# Patient Record
Sex: Male | Born: 1970 | Race: White | Hispanic: No | Marital: Single
Health system: Midwestern US, Community
[De-identification: ages and names within clinical notes are randomized; demographics above are authoritative.]

## PROBLEM LIST (undated history)

## (undated) DIAGNOSIS — E782 Mixed hyperlipidemia: Secondary | ICD-10-CM

## (undated) DIAGNOSIS — I10 Essential (primary) hypertension: Secondary | ICD-10-CM

## (undated) DIAGNOSIS — I2510 Atherosclerotic heart disease of native coronary artery without angina pectoris: Secondary | ICD-10-CM

## (undated) DIAGNOSIS — E785 Hyperlipidemia, unspecified: Secondary | ICD-10-CM

## (undated) DIAGNOSIS — 1 ERRONEOUS ENCOUNTER ICD10: Secondary | ICD-10-CM

---

## 2014-03-13 ENCOUNTER — Inpatient Hospital Stay
Admit: 2014-03-13 | Discharge: 2014-03-18 | Disposition: A | Discharge status: Home / Self Care | Payer: BLUE CROSS/BLUE SHIELD | Source: Ambulatory Visit | Attending: Cardiovascular Disease | Admitting: Cardiovascular Disease

## 2014-03-13 DIAGNOSIS — I2109 ST elevation (STEMI) myocardial infarction involving other coronary artery of anterior wall: Principal | ICD-10-CM

## 2014-03-13 LAB — TROPONIN
Troponin: 11.1 ng/mL (ref 0.00–0.03)
Troponin: 13.01 ng/mL (ref 0.00–0.03)

## 2014-03-13 MED ORDER — BIVALIRUDIN 250 MG IV SOLR
250 MG | INTRAVENOUS | Status: AC
Start: 2014-03-13 — End: 2014-03-14
  Administered 2014-03-14 (×4): 1.75 mg/kg/h via INTRAVENOUS

## 2014-03-13 MED ORDER — MORPHINE SULFATE (PF) 4 MG/ML IV SOLN
4 MG/ML | INTRAVENOUS | Status: DC | PRN
Start: 2014-03-13 — End: 2014-03-17
  Administered 2014-03-14 – 2014-03-17 (×6): 2 mg via INTRAVENOUS

## 2014-03-13 MED ORDER — CLOPIDOGREL BISULFATE 75 MG PO TABS
75 MG | Freq: Every day | ORAL | Status: DC
Start: 2014-03-13 — End: 2014-03-13

## 2014-03-13 MED ORDER — NITROGLYCERIN IN D5W 200-5 MCG/ML-% IV SOLN
200-5 MCG/ML-% | INTRAVENOUS | Status: DC | PRN
Start: 2014-03-13 — End: 2014-03-15

## 2014-03-13 MED ORDER — SODIUM CHLORIDE 0.9 % IV SOLN
0.9 % | INTRAVENOUS | Status: DC
Start: 2014-03-13 — End: 2014-03-13
  Administered 2014-03-13 (×2): 1.75 mg/kg/h via INTRAVENOUS
  Administered 2014-03-13: 13:00:00 0.25 mg/kg/h via INTRAVENOUS

## 2014-03-13 MED ORDER — CLOPIDOGREL BISULFATE 300 MG PO TABS
300 MG | Freq: Once | ORAL | Status: AC
Start: 2014-03-13 — End: 2014-03-13
  Administered 2014-03-13: 11:00:00 300 mg via ORAL

## 2014-03-13 MED ORDER — AMIODARONE HCL 150 MG/3ML IV SOLN
150 MG/3ML | Freq: Once | INTRAVENOUS | Status: AC
Start: 2014-03-13 — End: 2014-03-13
  Administered 2014-03-13: 20:00:00 300 mg via INTRAVENOUS

## 2014-03-13 MED ORDER — METOPROLOL TARTRATE 25 MG PO TABS
25 MG | Freq: Two times a day (BID) | ORAL | Status: DC
Start: 2014-03-13 — End: 2014-03-17
  Administered 2014-03-13 – 2014-03-18 (×10): 25 mg via ORAL

## 2014-03-13 MED ORDER — NORMAL SALINE FLUSH 0.9 % IV SOLN
0.9 % | Freq: Two times a day (BID) | INTRAVENOUS | Status: DC
Start: 2014-03-13 — End: 2014-03-15
  Administered 2014-03-14 – 2014-03-15 (×2): 10 mL via INTRAVENOUS

## 2014-03-13 MED ORDER — SODIUM CHLORIDE 0.9 % IV SOLN
0.9 % | INTRAVENOUS | Status: DC
Start: 2014-03-13 — End: 2014-03-14
  Administered 2014-03-13 – 2014-03-14 (×3): via INTRAVENOUS

## 2014-03-13 MED ORDER — METOPROLOL TARTRATE 25 MG PO TABS
25 MG | Freq: Once | ORAL | Status: AC
Start: 2014-03-13 — End: 2014-03-13
  Administered 2014-03-13: 17:00:00 25 mg via ORAL

## 2014-03-13 MED ORDER — MORPHINE SULFATE (PF) 4 MG/ML IV SOLN
4 MG/ML | INTRAVENOUS | Status: AC
Start: 2014-03-13 — End: 2014-03-13
  Administered 2014-03-13: 17:00:00 2 via INTRAVENOUS

## 2014-03-13 MED ORDER — AMIODARONE HCL 150 MG/3ML IV SOLN
150 MG/3ML | INTRAVENOUS | Status: DC
Start: 2014-03-13 — End: 2014-03-14
  Administered 2014-03-13: 20:00:00 1 mg/min via INTRAVENOUS
  Administered 2014-03-14 (×2): 0.5 mg/min via INTRAVENOUS

## 2014-03-13 MED ORDER — ALPRAZOLAM 0.25 MG PO TABS
0.25 MG | Freq: Four times a day (QID) | ORAL | Status: DC | PRN
Start: 2014-03-13 — End: 2014-03-17
  Administered 2014-03-13 – 2014-03-17 (×11): 0.25 mg via ORAL

## 2014-03-13 MED ORDER — ONDANSETRON HCL 4 MG/2ML IJ SOLN
4 MG/2ML | Freq: Four times a day (QID) | INTRAMUSCULAR | Status: DC | PRN
Start: 2014-03-13 — End: 2014-03-17
  Administered 2014-03-13: 14:00:00 4 mg via INTRAVENOUS

## 2014-03-13 MED ORDER — ACETAMINOPHEN 325 MG PO TABS
325 | ORAL | Status: DC | PRN
Start: 2014-03-13 — End: 2014-03-17
  Administered 2014-03-14 – 2014-03-16 (×4): 650 mg via ORAL

## 2014-03-13 MED ORDER — LISINOPRIL 2.5 MG PO TABS
2.5 MG | Freq: Once | ORAL | Status: AC
Start: 2014-03-13 — End: 2014-03-13
  Administered 2014-03-13: 17:00:00 2.5 mg via ORAL

## 2014-03-13 MED ORDER — LISINOPRIL 2.5 MG PO TABS
2.5 MG | Freq: Two times a day (BID) | ORAL | Status: DC
Start: 2014-03-13 — End: 2014-03-17
  Administered 2014-03-13 – 2014-03-18 (×10): 2.5 mg via ORAL

## 2014-03-13 MED ORDER — MAGNESIUM HYDROXIDE 400 MG/5ML PO SUSP
400 MG/5ML | Freq: Every day | ORAL | Status: DC | PRN
Start: 2014-03-13 — End: 2014-03-17

## 2014-03-13 MED ORDER — CLOPIDOGREL BISULFATE 75 MG PO TABS
75 MG | Freq: Every day | ORAL | Status: DC
Start: 2014-03-13 — End: 2014-03-17
  Administered 2014-03-13 – 2014-03-17 (×5): 75 mg via ORAL

## 2014-03-13 MED ORDER — ASPIRIN 325 MG PO TABS
325 MG | Freq: Every day | ORAL | Status: DC
Start: 2014-03-13 — End: 2014-03-17
  Administered 2014-03-14 – 2014-03-17 (×4): 325 mg via ORAL

## 2014-03-13 MED ORDER — NITROGLYCERIN IN D5W 200-5 MCG/ML-% IV SOLN
200-5 MCG/ML-% | INTRAVENOUS | Status: AC
Start: 2014-03-13 — End: 2014-03-13
  Administered 2014-03-13: 18:00:00 5 via INTRAVENOUS

## 2014-03-13 MED ORDER — NORMAL SALINE FLUSH 0.9 % IV SOLN
0.9 % | INTRAVENOUS | Status: DC | PRN
Start: 2014-03-13 — End: 2014-03-15

## 2014-03-13 MED ORDER — AMIODARONE HCL 150 MG/3ML IV SOLN
150 MG/3ML | Freq: Once | INTRAVENOUS | Status: DC
Start: 2014-03-13 — End: 2014-03-13

## 2014-03-13 MED FILL — ALPRAZOLAM 0.25 MG PO TABS: 0.25 MG | ORAL | Qty: 1

## 2014-03-13 MED FILL — LISINOPRIL 2.5 MG PO TABS: 2.5 MG | ORAL | Qty: 1

## 2014-03-13 MED FILL — ONDANSETRON HCL 4 MG/2ML IJ SOLN: 4 MG/2ML | INTRAMUSCULAR | Qty: 2

## 2014-03-13 MED FILL — BIVALIRUDIN 250 MG IV SOLR: 250 MG | INTRAVENOUS | Qty: 250

## 2014-03-13 MED FILL — AMIODARONE HCL 150 MG/3ML IV SOLN: 150 MG/3ML | INTRAVENOUS | Qty: 6

## 2014-03-13 MED FILL — AMIODARONE HCL 150 MG/3ML IV SOLN: 150 MG/3ML | INTRAVENOUS | Qty: 9

## 2014-03-13 MED FILL — METOPROLOL TARTRATE 25 MG PO TABS: 25 MG | ORAL | Qty: 1

## 2014-03-13 MED FILL — MORPHINE SULFATE (PF) 4 MG/ML IV SOLN: 4 mg/mL | INTRAVENOUS | Qty: 1

## 2014-03-13 MED FILL — NITROGLYCERIN IN D5W 200-5 MCG/ML-% IV SOLN: 200-5 MCG/ML-% | INTRAVENOUS | Qty: 250

## 2014-03-13 MED FILL — CLOPIDOGREL BISULFATE 75 MG PO TABS: 75 MG | ORAL | Qty: 1

## 2014-03-13 NOTE — Care Coordination-Inpatient (Signed)
PT resides at home alone and has family nearby who can assist if needed. PT plans to dc home with no known needs. Electronically signed by Crecencio Mc, MSW on 03/13/2014 at 9:23 AM

## 2014-03-13 NOTE — Progress Notes (Signed)
Patient continues to rest peacefully in the supine position. Augmentation alarm alarms occasionally. Vital signs stable. Rhythm is sinus brady; ectopy rare. Amio remains at 0.36m, nitro at 527m, angiomax at 1.7539mand ns at 100m31mill continue to monitor.  Electronically signed by MaraElna Breslow on 03/13/2014 at 10:50 PM

## 2014-03-13 NOTE — Progress Notes (Signed)
Reduced Amio gtt to 0.77m/hour. Will continue to monitor.   Electronically signed by MElna Breslow RN on 03/13/2014 at 9:31 PM

## 2014-03-13 NOTE — Progress Notes (Signed)
Spoke with Ginger Reddick RN with cardiology. Informed re: current bp and fact that pt vomited shortly after am meds given but could not say positive pills were not in emesis, although I did not see any obvious. She spoke with Dr Andres Labrum and repeat am med doses ordered.

## 2014-03-13 NOTE — Progress Notes (Signed)
amidarone bolus started. Will follow with Amiodarone gtt

## 2014-03-13 NOTE — Progress Notes (Signed)
Repeat am med doses now given as ordered.

## 2014-03-13 NOTE — Progress Notes (Signed)
Spoke with Monico Blitz RN with cardiologist Dr Andres Labrum. Clarified Plavix dose for today. Also clarified future lab troponin orders. Informed of last EKG results. Pt has had no further chest pain since early AM. Hr sinus with infrequent PVC after Amiodarone gtt started. Pt still c/o no sleep today, but has had many visitors and family is staying. Have requested to keep visitors to minimum and have been fewer visitors this afternoon except immediate family. Mother at bedside staying tonight. Pt's girlfriend in room at present.hr sinus 64. bp 114/69. IABP cont at 1:1. Angiomax gtt, Amiodarone gtt, Tridil gtt and NS continue as ordered.

## 2014-03-13 NOTE — Progress Notes (Signed)
Pt became suddenly nauseated after taking morning pills. Pt vomited 360cc mostly clear fluid. No pills noted. Pt was given Zofran 70m iv

## 2014-03-13 NOTE — Procedures (Signed)
CARDIOLOGY DEPARTMENT REPORT    CARDIOLOGIST:  Zoe Lan, MD    PROCEDURE DATE:  03/13/2014      PROCEDURES PERFORMED    Cardiac catheterization with angioplasty and stenting of the left anterior  descending coronary artery and placement of an intraaortic balloon pump.      INDICATION   Acute anterolateral wall myocardial infarction.      DESCRIPTION OF PROCEDURE    The right femoral region was sterilely prepped and draped.  Following  anesthesia with 2% Xylocaine, a 6-French short sheath was placed in the right  femoral artery and 5-French catheters were advanced without difficulty.      HEMODYNAMICS    1.  The AO is 120/80.    2.  The LV is 120/10 with an LVEDP of 30.      LEFT VENTRICULOGRAM    Left ventricle was viewed in the RAO position.  Optiray was injected 15 mL a  second for 2 seconds.  These views revealed apical dyskinesis with the  overall ejection fraction estimated to be 30% to 35%.      SELECTIVE CORONARY ARTERIOGRAPHY   LEFT MAIN CORONARY ARTERY:  The left main coronary artery is a large-caliber  vessel, intermediate in length, free of significant occlusive disease.    LEFT ANTERIOR DESCENDING:  The left anterior descending is a large-caliber  vessel, totally occluded in its proximal portion.    LEFT CIRCUMFLEX:  The left circumflex is a large-caliber vessel with a 70% to  80% stenosis in an early arising small obtuse marginal branch.  The left  circumflex is a large-caliber dominant vessel with a large region of  distribution.    RIGHT CORONARY ARTERY:  The right coronary artery is an intermediate caliber,  nondominant vessel with subtotal focused stenosis of the acute marginal  branch.      Following the above-mentioned coronary angiograms, the left main coronary  artery was engaged with a 6-French Q guide.  A Cougar _____ wire was placed  in the distal left anterior descending.  The point of total occlusion was  ballooned with a 3.5 x 12 mm TREK balloon with restoration of flow.   Subsequent  to this, a 4.0 x 23 mm Xience drug-eluting stent was placed and  dilated to 12 atmospheres.  This resulted in a very satisfactory angiographic  result.  There is noted to be persistent thrombus in the distal portion of  the left anterior descending diagonal.      Following the angioplasty and stenting, an 8-French sheath was placed in the  right femoral artery with a 8-French balloon placed in the descending aorta.   The balloon pump was secured to the skin with provided locking devices.      IMPRESSION    1.  Left ventriculogram revealing apical dyskinesis with the overall ejection  fraction estimated to be 30% to 35%.    2.  Total occlusion of the left anterior descending with revascularization  employing a 4.0 x 23 mm Xience drug-eluting stent.    3.  Severe disease of an early arising obtuse marginal branch and acute  marginal branch of the nondominant right coronary artery.          ________________________________  Zoe Lan, MD    [phone]  DD: 03/13/2014 04:53  DT: 03/13/2014 05:30  SSI File#: [id number]  Job #: [id number]    cc:

## 2014-03-13 NOTE — Progress Notes (Signed)
Received from cath lab via bed post cardiac cath, with IABP to R groin . angio max infusing and to remain continuous. Family at bedside.

## 2014-03-13 NOTE — Progress Notes (Signed)
[facility]  Progress Note                            Date:  03/13/2014  Patient: Gregory Escobar  Admission:  03/13/2014  5:07 AM  Admit DX: Acute MI ([place]) [I21.3]  Age:  44 y.o., 01/05/1971     LOS: 0 days     Reason for evaluation:                                                                Acute anterior- lateral wall myocardial infarction complicated by ventricular                                                                                               tachycardia/ventricular fibrillation.      SUBJECTIVE:    The patient was seen and examined. Notes and labs reviewed.  There were not complications over night.    Patient's cardiac review of systems: negative, for chest pain or shortness of breath.  The patient is resting more comfortably at present.  He had some nausea and vomiting earlier.  Also has been anxious but is calm at present.  Right groin soft with baloon pump in place.  Positive dp.  Breath sounds clear.        OBJECTIVE:    Telemetry: sinus     IMPRESSION   1. Left ventriculogram revealing apical dyskinesis with the overall ejection  fraction estimated to be 30% to 35%.   2. Total occlusion of the left anterior descending with revascularization  employing a 4.0 x 23 mm Xience drug-eluting stent.   3. Severe disease of an early arising obtuse marginal branch and acute  marginal branch of the nondominant right coronary artery.          BP 143/72 mmHg   Pulse 65   Temp(Src) 98.6 ??F (37 ??C) (Tympanic)   Resp 17   Ht 5' 7"  (1.702 m)   Wt 157 lb 3 oz (71.3 kg)   BMI 24.61 kg/m2   SpO2 97%    Intake/Output Summary (Last 24 hours) at 03/13/14 0955  Last data filed at 03/13/14 0900   Gross per 24 hour   Intake 376.05 ml   Output     78 ml   Net 298.05 ml       Labs:   CBC: No results for input(s): WBC, HGB, HCT, PLT in the last 72 hours.  BMP: No results for input(s): NA, K, CO2, BUN, CREATININE, LABGLOM, GLUCOSE in the last 72 hours.  BNP: No results for input(s): BNP in the  last 72 hours.  PT/INR: No results for input(s): PROTIME, INR in the last 72 hours.  APTT:No results for input(s): APTT in the last 72 hours.  CARDIAC ENZYMES:  Recent Labs      03/13/14  0704   TROPONINI  11.10*     FASTING LIPID PANEL:No results found for: HDL, LDLDIRECT, LDLCALC, TRIG  LIVER PROFILE:No results for input(s): AST, ALT, LABALBU in the last 72 hours.    NURSE:  Monico Blitz, RN    Reason for initial evaluation    Cad/mi      Today's Current Status: not resting well     These symptoms and signs show no change       PFSH:  New:  no       Change:  no    ROSS: New:  no        Change:  no      Physical Examination:  BP 151/89 mmHg   Pulse 69   Temp(Src) 98.6 ??F (37 ??C) (Tympanic)   Resp 16   Ht 5' 7"  (1.702 m)   Wt 157 lb 3 oz (71.3 kg)   BMI 24.61 kg/m2   SpO2 98%    CONSTITUTIONAL:  Awake, alert, cooperative, no apparent distress, and appears stated age.  HEENT: Normal jugular venous pulsations, no carotid bruits. Head is atraumatic, normocephalic. Eyes and oral mucosa are normal.  LUNGS: Respiratory effort for the work of breathing is normal Yes.  On auscultation: clear to auscultation bilaterally and diminished breath sounds bilaterally  CARDIOVASCULAR:  Normal apical impulse, regular rate and rhythm, normal S1 and S2, no S3 or S4, and no murmur or rub is noted.  ABDOMEN: Soft, non tender, non distended. Bowel sounds are present. No masses or tenderness.  SKIN: Warm and dry.  EXTREMITIES: No lower extremity edema.  No cyanosis or clubbing.  NEUROLOGY:  Motor movement grossly intact.  Focal signs are not identified.        Current Inpatient Medications:  ??? sodium chloride flush  10 mL Intravenous 2 times per day   ??? lisinopril  2.5 mg Oral BID   ??? aspirin  325 mg Oral Daily   ??? [START ON 03/14/2014] clopidogrel  75 mg Oral Daily   ??? metoprolol  25 mg Oral BID       IV Infusions (if any):  ??? sodium chloride 100 mL/hr at 03/13/14 0729   ??? bivulirudin (ANGIOMAX) infusion 0.25 mg/kg/hr (03/13/14 0828)    ??? nitroGLYCERIN 5 mcg/min (03/13/14 1230)       Diagnostics:           ASSESSMENT:    Patient Active Problem List    Diagnosis Date Noted   ??? Coronary artery disease involving native coronary artery 03/13/2014       PLAN:    1. Continue present medications  2. Continue to monitor rhythm  3.   Further orders per clinical course.   4. D/c iabp in am  5. amio for v.t.      Please see orders.  Discussed with patient and family and nursing.    Fredirick Maudlin, MD    [facility]                    D/c iabp

## 2014-03-13 NOTE — Plan of Care (Signed)
Problem: Anxiety:  Intervention: A calm environment  Lights dimmed, glass door pulled, and current pulled to view IABP    Goal: Level of anxiety will decrease  Level of anxiety will decrease   Outcome: Ongoing  Patient requested a 0.25mg  Xanax prior to sleep. Currently resting peacefully and shows no s/s of distress.     Problem: Tobacco Use:  Goal: Inpatient tobacco use cessation counseling participation  Inpatient tobacco use cessation counseling participation   Outcome: Ongoing  Educated patient on  Harmful effects of tobacco use; especially cardiovascular effects.    Problem: KNOWLEDGE DEFICIT  Intervention: EDUCATE PATIENT ABOUT MEDICATIONS  Verbally educated patient on new medications and provided Lex-comp handouts for patient to keep.

## 2014-03-13 NOTE — Progress Notes (Signed)
Pt c/o anxiety. Is very anxious. Any alarm or beeping in room has pt hr elevating. Family  In room also anxious awaiting Dr Andres Labrum to round.  Xanax 0.14m po given for anxiety. Will monitor for relief.

## 2014-03-13 NOTE — Progress Notes (Signed)
Initial assessment completed and charted. Patient is currently awake, alert, and oriented. IABP remains in place to right groin; site is c/d/i and shows no s/s of bleeding. Good waveform on IABP noted, no alarms currently going off. All pulses palpable, patient is NSR/SB, amio remains at 1, nitro at 5, angiomax at 1.67m, and ns [facility]  Zeroed IABP to phlebostatic axis and strip printed.   VSS. Will continue to monitor.   Electronically signed by MaElna BreslowRN on 03/13/2014 at 8:36 PM

## 2014-03-13 NOTE — Progress Notes (Signed)
Dr Andres Labrum here. Aware of abnormal rhythm with frequent PVC'S. Will order Amiodarone gtt. Spoke with pt and family re: continued IABP until am.

## 2014-03-13 NOTE — Progress Notes (Signed)
Pt bp 157/96. Hr 72. No c/o pain, but apears ST elevation segment some increased again per monitor. Have informed family limiting visitors today would be good idea. Pt needs rest most of all. Family agrees when taking phone calls will request wait until tomorrow to visit pt. Pt agrees with rest.

## 2014-03-13 NOTE — Progress Notes (Signed)
Monico Blitz RN with cardiology here. Informed her of n/v after am meds. Pt has had no c/o pain. Closes eyes and rests in between visitors.

## 2014-03-13 NOTE — Progress Notes (Signed)
Pt c/o chest pain and mild left arm pain. States " feels like when it first started this morning. Not bad yet , but maybe its from where they shocked me. They said i would be sore.". Morphine 105m iv given for pain rating of 5 on 0-10 scale. Will start Tridil at low dose iv gtt. See emar.

## 2014-03-13 NOTE — Progress Notes (Signed)
Patient was instructed to take asa at discharge.  It is not listed due to issues with the new EMR.

## 2014-03-13 NOTE — Progress Notes (Signed)
Patient resting at this time. Shows no s/s of distress. Breathing is even and unlabored. Vital signs stable. Call light within reach. Will continue to monitor.   Electronically signed by Elna Breslow, RN on 03/13/2014 at 9:13 PM

## 2014-03-13 NOTE — Progress Notes (Signed)
Patient c/o back and shoulder stiffness rating 7/10. 70m IV Morphine given. Denies any chest pain.  Will continue to monitor.  Electronically signed by MElna Breslow RN on 03/13/2014 at 8:37 PM

## 2014-03-13 NOTE — H&P (Signed)
HISTORY AND PHYSICAL    ADMIT DATE:  03/13/2014    REASON FOR ADMISSION  1.  Acute myocardial infarction, complicated by ventricular  tachycardia/ventricular fibrillation with successful direct current  cardioversion.  2.  Tobacco abuse.  3.  Family history of atherosclerotic cardiovascular disease.  4.  History of renal calculi extraction.    HISTORY OF PRESENT ILLNESS    The patient is a 44 year old white male who presents in transfer via  ambulance from [place].  The patient was awoken with chest pain in his chest  radiating through to his back and down his left arm.  The discomforts were  associated with diaphoresis and shortness of breath.  He describes no nausea  or vomiting.  He has had no exertional discomforts typical of angina.  He  denies orthopnea, paroxysmal nocturnal dyspnea or peripheral edema.  There  has been no fevers, chills or evidence of GI bleeding.  Risk factors for  coronary artery disease include tobacco abuse in family history.    REVIEW OF SYSTEMS   CONSTITUTIONAL:  No fevers, chills, night sweats, or weight loss.   HEENT:  No vision loss, double vision, blurred vision, or tearing.  No  hearing loss, tinnitus, or infection.  No nasal discharge or epistaxis.  No  dysphagia.   RESPIRATORY:  No shortness of breath, cough, or sputum production.  No  history of TB exposure.   CARDIAC:  There has been no exertional chest pain typical of angina, overt  heart failure, no syncope.    PERIPHERAL VASCULAR:  No history of claudication.   GASTROINTESTINAL:  No nausea, vomiting, diarrhea, or constipation.  No reflux  or gastroesophageal reflux disease.   GENITOURINARY:  No dysuria, urgency, frequency, or history of urinary tract  infections.  No history of nephrolithiasis or renal insufficiency.   NEUROLOGIC:  No history of cerebrovascular accident, transient ischemic  attack, or amaurosis fugax.  No history of seizure disorder.   INTEGUMENTARY:  No history of nonhealing wounds or skin cancer removal.    PSYCHIATRIC:  No excessive anxiety or depression.   ENDOCRINE:  No polyuria, polydipsia, or significant weight gain.  No heat or  cold intolerance.   MUSCULOSKELETAL:  No limit to range of motion of joints or swelling of limbs.    HEMATOLOGIC:  No history of DVT, PE, or anemia.     All other review of systems is negative.     PHYSICAL EXAMINATION  GENERAL:  Alert and oriented x3 in no apparent distress.  Short-term and  long-term memory intact. Judgment intact.   VITAL SIGNS:   HEAD:  Normocephalic without evidence of old or recent trauma.   EYES:  Sclerae clear.  Conjunctivae pink.  EOMs intact.  Pupils equal and  round.   EARS:  Negative.  Tympanic membranes not visualized.   NOSE:  Negative.   THROAT:  No lesions on lips or buccal mucosa.  Tongue protrudes in midline  and is well papillated.   NECK:  Supple without mass or JVD.  Carotid pulses 2+ to palpation  bilaterally without bruit.  No thyromegaly noted.   CHEST:  Equal bilateral expansion.   LUNGS:  Clear to auscultation and percussion.   HEART: Regular rate and rhythm without S3 or S4 murmur.   ABDOMEN:  Soft, nontender.  Bowel sounds x4 quadrants.  No hepatosplenomegaly  or palpable mass.   UPPER EXTREMITY EVALUATION:  Radial pulses palpable bilaterally.  No  cyanosis, clubbing, or edema.   LOWER EXTREMITY EVALUATION:  Femoral, popliteal, dorsalis pedis, and  posterior tibialis pulses 2+ to palpation bilaterally.  No cyanosis,  clubbing, or edema or signs of atheroembolic event.   SKIN:  Warm, dry, intact.   NEUROLOGIC:  Physiologic.   MUSCULOSKELETAL:  Negative.   RECTAL/GENITALIA:  Deferred.     IMPRESSION    Acute lateral wall myocardial infarction complicated by ventricular  tachycardia/ventricular fibrillation.    PLAN  Emergent cardiac catheterization.        ________________________________  Zoe Lan, MD    [id number]  DD: 03/13/2014 04:10  DT: 03/13/2014 05:10  SSI File#: [id number]  Job #: [id number]    cc:      Cardiology

## 2014-03-14 LAB — LDL CHOLESTEROL, DIRECT: LDL Direct: 77 mg/dL — ABNORMAL LOW (ref <100)

## 2014-03-14 LAB — COMPREHENSIVE METABOLIC PANEL
ALT: 73 U/L — ABNORMAL HIGH (ref 5–41)
AST: 233 U/L — ABNORMAL HIGH (ref 5–40)
Albumin: 3.4 g/dL — ABNORMAL LOW (ref 3.5–5.2)
Alkaline Phosphatase: 44 U/L (ref 40–130)
Anion Gap: 17 mmol/L (ref 7–19)
BUN: 17 mg/dL (ref 6–20)
CO2: 20 mmol/L — ABNORMAL LOW (ref 22–29)
Calcium: 8.6 mg/dL (ref 8.6–10.0)
Chloride: 100 mmol/L (ref 98–111)
Creatinine: 0.7 mg/dL (ref 0.6–0.9)
GFR Non-African American: >60 (ref >60)
Globulin: 2.3 g/dL
Glucose: 129 mg/dL — ABNORMAL HIGH (ref 74–109)
Potassium: 4 mmol/L (ref 3.5–5.1)
Sodium: 137 mmol/L (ref 136–145)
Total Bilirubin: 0.4 mg/dL (ref 0.2–1.2)
Total Protein: 5.7 g/dL — ABNORMAL LOW (ref 6.7–8.7)

## 2014-03-14 LAB — CBC
Hematocrit: 38.4 % — ABNORMAL LOW (ref 42.0–52.0)
Hemoglobin: 12.8 g/dL — ABNORMAL LOW (ref 14.0–18.0)
MCH: 28.8 pg (ref 27.0–31.0)
MCHC: 33.3 g/dL (ref 33.0–37.0)
MCV: 86.3 fL (ref 80.0–94.0)
MPV: 10.4 fL (ref 7.4–10.4)
Platelets: 211 10*3/uL (ref 130–400)
RBC: 4.45 M/uL — ABNORMAL LOW (ref 4.70–6.10)
RDW: 14 % (ref 11.5–14.5)
WBC: 10.7 10*3/uL (ref 4.8–10.8)

## 2014-03-14 LAB — LIPID PANEL
Cholesterol, Total: 125 mg/dL — ABNORMAL LOW (ref 160–199)
HDL: 42 mg/dL — ABNORMAL LOW (ref 55–121)
LDL Calculated: 69 mg/dL (ref <100)
Triglycerides: 70 mg/dL — ABNORMAL LOW (ref 150–199)
VLDL Cholesterol Calculated: 14 mg/dL

## 2014-03-14 LAB — TROPONIN: Troponin: 6.04 ng/mL (ref 0.00–0.03)

## 2014-03-14 MED ORDER — MEPERIDINE HCL 25 MG/ML IJ SOLN
25 MG/ML | Freq: Once | INTRAMUSCULAR | Status: AC
Start: 2014-03-14 — End: 2014-03-14

## 2014-03-14 MED ORDER — MEPERIDINE HCL 25 MG/ML IJ SOLN
25 MG/ML | INTRAMUSCULAR | Status: AC
Start: 2014-03-14 — End: 2014-03-14
  Administered 2014-03-14: 12:00:00 25 via INTRAVENOUS

## 2014-03-14 MED ORDER — ATORVASTATIN CALCIUM 20 MG PO TABS
20 MG | Freq: Every evening | ORAL | Status: DC
Start: 2014-03-14 — End: 2014-03-17
  Administered 2014-03-15 – 2014-03-18 (×4): 20 mg via ORAL

## 2014-03-14 MED ORDER — MIDAZOLAM HCL 2 MG/2ML IJ SOLN
2 MG/ML | INTRAMUSCULAR | Status: AC
Start: 2014-03-14 — End: 2014-03-14
  Administered 2014-03-14: 12:00:00 0.5 via INTRAVENOUS

## 2014-03-14 MED ORDER — SODIUM CHLORIDE 0.9 % IV SOLN
0.9 % | INTRAVENOUS | Status: DC
Start: 2014-03-14 — End: 2014-03-15
  Administered 2014-03-14: 19:00:00 via INTRAVENOUS

## 2014-03-14 MED ORDER — MIDAZOLAM HCL 2 MG/2ML IJ SOLN
2 MG/ML | Freq: Once | INTRAMUSCULAR | Status: AC
Start: 2014-03-14 — End: 2014-03-14

## 2014-03-14 MED ORDER — FLUMAZENIL 0.5 MG/5ML IV SOLN
0.5 MG/5ML | INTRAVENOUS | Status: DC | PRN
Start: 2014-03-14 — End: 2014-03-15

## 2014-03-14 MED ORDER — AMIODARONE HCL 150 MG/3ML IV SOLN
150 MG/3ML | INTRAVENOUS | Status: DC
Start: 2014-03-14 — End: 2014-03-15
  Administered 2014-03-15: 10:00:00 0.5 mg/min via INTRAVENOUS

## 2014-03-14 MED FILL — MORPHINE SULFATE (PF) 4 MG/ML IV SOLN: 4 mg/mL | INTRAVENOUS | Qty: 1

## 2014-03-14 MED FILL — ASPIRIN 325 MG PO TABS: 325 MG | ORAL | Qty: 1

## 2014-03-14 MED FILL — METOPROLOL TARTRATE 25 MG PO TABS: 25 MG | ORAL | Qty: 1

## 2014-03-14 MED FILL — ALPRAZOLAM 0.25 MG PO TABS: 0.25 MG | ORAL | Qty: 1

## 2014-03-14 MED FILL — LISINOPRIL 2.5 MG PO TABS: 2.5 MG | ORAL | Qty: 1

## 2014-03-14 MED FILL — CLOPIDOGREL BISULFATE 75 MG PO TABS: 75 MG | ORAL | Qty: 1

## 2014-03-14 MED FILL — MIDAZOLAM HCL 2 MG/2ML IJ SOLN: 2 MG/ML | INTRAMUSCULAR | Qty: 2

## 2014-03-14 MED FILL — AMIODARONE HCL 150 MG/3ML IV SOLN: 150 MG/3ML | INTRAVENOUS | Qty: 9

## 2014-03-14 MED FILL — TYLENOL 325 MG PO TABS: 325 MG | ORAL | Qty: 2

## 2014-03-14 MED FILL — DEMEROL 25 MG/ML IJ SOLN: 25 MG/ML | INTRAMUSCULAR | Qty: 1

## 2014-03-14 NOTE — Progress Notes (Signed)
[facility]  Progress Note                            Date:  03/14/2014  Patient: Gregory Escobar  Admission:  03/13/2014  5:07 AM  Admit DX: Acute MI ([place]) [I21.3]  Age:  44 y.o., [birth year]     LOS: 1 day     Reason for evaluate  Acute anterior- lateral wall myocardial infarction complicated by ventricular                                                                            tachycardia/ventricular fibrillation.        SUBJECTIVE:    The patient was seen and examined. Notes and labs reviewed.    Balloon pump D/C'd per Dr. Andres Labrum               OBJECTIVE:    Telemetry: Sinus    03-13-14 cath    IMPRESSION   1. Left ventriculogram revealing apical dyskinesis with the overall ejection  fraction estimated to be 30% to 35%.   2. Total occlusion of the left anterior descending with revascularization  employing a 4.0 x 23 mm Xience drug-eluting stent.   3. Severe disease of an early arising obtuse marginal branch and acute  marginal branch of the nondominant right coronary artery.        BP 104/80 mmHg   Pulse 67   Temp(Src) 98.5 ??F (36.9 ??C) (Temporal)   Resp 23   Ht 5' 7"  (1.702 m)   Wt 166 lb (75.297 kg)   BMI 25.99 kg/m2   SpO2 94%    Intake/Output Summary (Last 24 hours) at 03/14/14 0826  Last data filed at 03/14/14 0600   Gross per 24 hour   Intake 3747.03 ml   Output    785 ml   Net 2962.03 ml       Labs:   CBC:   Recent Labs      03/14/14   0135   WBC  10.7   HGB  12.8*   HCT  38.4*   PLT  211     BMP: Recent Labs      03/14/14   0135   NA  137   K  4.0   CO2  20*   BUN  17   CREATININE  0.7   LABGLOM  >60   GLUCOSE  129*     BNP: No results for input(s): BNP in the last 72 hours.  PT/INR: No results for input(s): PROTIME, INR in the last 72 hours.  APTT:No results for input(s): APTT in the last 72 hours.  CARDIAC ENZYMES:  Recent Labs      03/13/14   0704  03/13/14   1205  03/14/14   0135   TROPONINI  11.10*  13.01*  6.04*     FASTING LIPID PANEL:  Lab Results   Component Value Date     HDL 42 03/14/2014    LDLDIRECT 77 03/14/2014    LDLCALC 69 03/14/2014    TRIG 70 03/14/2014     LIVER PROFILE:  Recent Labs      03/14/14  0135   AST  233*   ALT  73*   LABALBU  3.4*       NURSE:  Monico Blitz, RN    Reason for initial evaluation    Cad/mi      Today's Current Status: no cp      These symptoms and signs are improving       PFSH:  New:  no       Change:  no    ROSS: New:  no        Change:  no      Physical Examination:  BP 104/80 mmHg   Pulse 67   Temp(Src) 98.5 ??F (36.9 ??C) (Temporal)   Resp 23   Ht 5' 7"  (1.702 m)   Wt 166 lb (75.297 kg)   BMI 25.99 kg/m2   SpO2 94%    CONSTITUTIONAL:  Awake, alert, cooperative, no apparent distress, and appears stated age.  HEENT: Normal jugular venous pulsations, no carotid bruits. Head is atraumatic, normocephalic. Eyes and oral mucosa are normal.  LUNGS: Respiratory effort for the work of breathing is normal Yes.  On auscultation: clear to auscultation bilaterally  CARDIOVASCULAR:  Normal apical impulse, regular rate and rhythm, normal S1 and S2, no S3 or S4, and no murmur or rub is noted.  ABDOMEN: Soft, non tender, non distended. Bowel sounds are present. No masses or tenderness.  SKIN: Warm and dry.  EXTREMITIES: No lower extremity edema.  No cyanosis or clubbing.  NEUROLOGY:  Motor movement grossly intact.  Focal signs are not identified.        Current Inpatient Medications:  ??? midazolam  1 mg Intravenous Once   ??? meperidine  25 mg Intravenous Once   ??? meperidine       ??? midazolam       ??? atorvastatin  20 mg Oral Nightly   ??? sodium chloride flush  10 mL Intravenous 2 times per day   ??? lisinopril  2.5 mg Oral BID   ??? aspirin  325 mg Oral Daily   ??? metoprolol  25 mg Oral BID   ??? clopidogrel  75 mg Oral Daily       IV Infusions (if any):  ??? sodium chloride 100 mL/hr at 03/14/14 0106   ??? nitroGLYCERIN 5 mcg/min (03/13/14 1230)   ??? amiodarone 47m/250ml D5W infusion 0.5 mg/min (03/13/14 2125)       Diagnostics:          ASSESSMENT:    Patient Active  Problem List    Diagnosis Date Noted   ??? Coronary artery disease involving native coronary artery 03/13/2014       PLAN:    1. Continue present medications  2. Continue to monitor rhythm  3.   Further orders per clinical course.   4.   Dc-iabp-done-pcu in am    Please see orders.  Discussed with patient and nursing.    MFredirick Maudlin MD    [facility]

## 2014-03-14 NOTE — Progress Notes (Signed)
Pt bedrest is completed. Requested to sit on side of bed.  Pt assisted up to side of bed and to stand briefly. Pt stated he was a little light headed on standing. Stood for approx 3 min. Bed was straightened and pt lie back in bed. Pt phone rang and he answered and began talking. Hr sinus 86 bp 118/67. Rt groin dressing remained dry and intact no bleeding noted.

## 2014-03-14 NOTE — Progress Notes (Signed)
No changes to report at this time. VSS. Will continue to monitor. Electronically signed by Kristopher Glee, RN on 03/15/2014 at 1:20 AM

## 2014-03-14 NOTE — Progress Notes (Signed)
Pt c/o HA "throbbing". Pt at first had request xanax, but too early for dose which I explained to pt. Then he stated he had a throbbing HA. Tylenol 610m po given

## 2014-03-14 NOTE — Progress Notes (Signed)
Hemostasis obtained. Pressure dressing placed. Small previous hematoma noted prior to removal IABP unchanged. Small area bruising. BP 114/69. Hr 77 sinus. Sats 94% on 2L NC.

## 2014-03-14 NOTE — Progress Notes (Signed)
IABP turned to 1:3. Per order Dr Andres Labrum. Weaning in prep for IABP removal.

## 2014-03-14 NOTE — Progress Notes (Signed)
Dr. Leatrice Jewels notified of elevated troponin of 6.04. No new orders received.   Electronically signed by Elna Breslow, RN on 03/14/2014 at 3:30 AM

## 2014-03-14 NOTE — Progress Notes (Signed)
Pt rt groin soft. No bleeding noted. Pressure dressing dry and intact. Pulses palpable. Small area of bruising below pressure dressing site. Hr sinus 76 bp 91/64 at present. Amiodarone gtt at 0.42m/min. Tridil gtt at 56m/min. NS at 10014mr. Pt has been instructed on post IABP removal bedrest. Still keeping head on pillow. Holding pressure rt groin if sneezing or coughing.

## 2014-03-14 NOTE — Progress Notes (Signed)
Pt c/o HA and neck ache. Some back pain from lying in bed on back. "not use to lying on my back. I'm a side lying person." pt remains on bedrest for 6 hr post removal IABP. Rt Groin site bruised slightly below site. Dressing dry and intact. Pulses remains strong palpable; hr sinus 73 bp 112/63. Morphine 34m iv given. Pt's mother states he is irritable due to not having cigarette for 2 day. Have reinforced the need to stop smoking post MI.

## 2014-03-14 NOTE — Progress Notes (Deleted)
Elevated Troponin of 6.04 this am. Physician not notified. Physician aware of elevated troponins; remains on IABP. Admitting Troponin was 11.01, 2nd Troponin was 13.01. Troponins trending down. Patient continues to deny any chest pain. VSS.  Electronically signed by Elna Breslow, RN on 03/14/2014 at 3:12 AM

## 2014-03-14 NOTE — Progress Notes (Signed)
Pt sleeping. Hr sinus 70 bp 106/63. sats 93% on 2l nc. rr 23.

## 2014-03-14 NOTE — Progress Notes (Signed)
Pt woke to call out iv pump beeping. Pump reset. Pt closed eyes. Resting. Mother remains at bedside. No c/o pain at present.

## 2014-03-14 NOTE — Progress Notes (Signed)
IABP removed by Dr Andres Labrum.  Pressure to be held until hemostasis obtained. No difficulty with removal. Moderate discomfort felt per pt statement.

## 2014-03-14 NOTE — Progress Notes (Signed)
Pt felt warm to touch. Temp now 101.1  Dr Robet Leu notified. Orders received.

## 2014-03-14 NOTE — Progress Notes (Signed)
Pt cont to rest in bed. No complaint of pain at present. Mother remains at bedside on cot napping off and on. Pt Hr 90 at present. BP 112/68. sats 95% on 2L.Marland Kitchen Amiodarone gtt cont infusing as ordered.

## 2014-03-14 NOTE — Progress Notes (Signed)
Blood cultures have now been obtained. Tylenol 677m po  Given for temp.

## 2014-03-14 NOTE — Progress Notes (Signed)
Shift assessment completed and charted at this time. No c/o pain or discomfort. Family at bedside. VSS. Will continue to monitor. Electronically signed by Kristopher Glee, RN on 03/15/2014 at 1:19 AM

## 2014-03-14 NOTE — Progress Notes (Signed)
tridil at 69mg/min now turned off. No c/o chest pain. Some chest soreness noted post v-tach /v-fib defib prior to arrival pre cath 1-12

## 2014-03-14 NOTE — Progress Notes (Signed)
Turned off Angiomax gtt per Dr's order.   Electronically signed by Elna Breslow, RN on 03/14/2014 at 3:50 AM

## 2014-03-14 NOTE — Progress Notes (Signed)
Patient restless but remains cooperative with care. VSS. Sinus brady, bp 109/71(79). Patient denies any chest pain. Will continue to monitor.   Electronically signed by Elna Breslow, RN on 03/14/2014 at 5:56 AM

## 2014-03-14 NOTE — Progress Notes (Signed)
Reassessment completed and charted. No changes in patient status at this time. IABP site remains c/d/i and shows no s/s of bleeding. VSS. Will continue to monitor.   Electronically signed by Elna Breslow, RN on 03/14/2014 at 12:09 AM

## 2014-03-14 NOTE — Progress Notes (Signed)
Pt visiting with friend and family in room. Still states some HA but improved 5 on scale now.

## 2014-03-15 LAB — BASIC METABOLIC PANEL
Anion Gap: 13 mmol/L (ref 7–19)
BUN: 9 mg/dL (ref 6–20)
CO2: 23 mmol/L (ref 22–29)
Calcium: 8.6 mg/dL (ref 8.6–10.0)
Chloride: 99 mmol/L (ref 98–111)
Creatinine: 0.6 mg/dL (ref 0.6–0.9)
GFR Non-African American: >60 (ref >60)
Glucose: 103 mg/dL (ref 74–109)
Potassium: 4.1 mmol/L (ref 3.5–5.1)
Sodium: 135 mmol/L — ABNORMAL LOW (ref 136–145)

## 2014-03-15 LAB — CBC WITH AUTO DIFFERENTIAL
Basophils %: 0.3 % (ref 0.0–1.0)
Basophils Absolute: 0 10*3/uL (ref 0.00–0.20)
Eosinophils %: 0.7 % (ref 0.0–5.0)
Eosinophils Absolute: 0.1 10*3/uL (ref 0.00–0.60)
Hematocrit: 39.1 % — ABNORMAL LOW (ref 42.0–52.0)
Hemoglobin: 12.9 g/dL — ABNORMAL LOW (ref 14.0–18.0)
Lymphocytes %: 11.7 % — ABNORMAL LOW (ref 20.0–40.0)
Lymphocytes Absolute: 1.3 10*3/uL (ref 1.1–4.5)
MCH: 28.5 pg (ref 27.0–31.0)
MCHC: 33 g/dL (ref 33.0–37.0)
MCV: 86.3 fL (ref 80.0–94.0)
MPV: 10.6 fL — ABNORMAL HIGH (ref 7.4–10.4)
Monocytes %: 11 % — ABNORMAL HIGH (ref 0.0–10.0)
Monocytes Absolute: 1.3 10*3/uL — ABNORMAL HIGH (ref 0.00–0.90)
Neutrophils %: 76.3 % — ABNORMAL HIGH (ref 50.0–65.0)
Neutrophils Absolute: 8.7 10*3/uL — ABNORMAL HIGH (ref 1.5–7.5)
Platelets: 184 10*3/uL (ref 130–400)
RBC: 4.53 M/uL — ABNORMAL LOW (ref 4.70–6.10)
RDW: 14.2 % (ref 11.5–14.5)
WBC: 11.4 10*3/uL — ABNORMAL HIGH (ref 4.8–10.8)

## 2014-03-15 LAB — CBC
Hematocrit: 39.7 % — ABNORMAL LOW (ref 42.0–52.0)
Hemoglobin: 13.4 g/dL — ABNORMAL LOW (ref 14.0–18.0)
MCH: 29.1 pg (ref 27.0–31.0)
MCHC: 33.8 g/dL (ref 33.0–37.0)
MCV: 86.3 fL (ref 80.0–94.0)
MPV: 10.4 fL (ref 7.4–10.4)
Platelets: 197 10*3/uL (ref 130–400)
RBC: 4.6 M/uL — ABNORMAL LOW (ref 4.70–6.10)
RDW: 14.3 % (ref 11.5–14.5)
WBC: 12.6 10*3/uL — ABNORMAL HIGH (ref 4.8–10.8)

## 2014-03-15 LAB — TROPONIN: Troponin: 3.58 ng/mL (ref 0.00–0.03)

## 2014-03-15 LAB — LDL CHOLESTEROL, DIRECT: LDL Direct: 71 mg/dL — ABNORMAL LOW (ref <100)

## 2014-03-15 LAB — CULTURE, MRSA, SCREENING: MRSA Culture Only: NOT DETECTED

## 2014-03-15 MED ORDER — MAGNESIUM HYDROXIDE 400 MG/5ML PO SUSP
400 MG/5ML | Freq: Every day | ORAL | Status: DC | PRN
Start: 2014-03-15 — End: 2014-03-15

## 2014-03-15 MED ORDER — NORMAL SALINE FLUSH 0.9 % IV SOLN
0.9 % | INTRAVENOUS | Status: DC | PRN
Start: 2014-03-15 — End: 2014-03-17

## 2014-03-15 MED ORDER — THERAPEUTIC MULTIVIT/MINERAL PO TABS
Freq: Every day | ORAL | Status: DC
Start: 2014-03-15 — End: 2014-03-17
  Administered 2014-03-15 – 2014-03-17 (×3): 1 via ORAL

## 2014-03-15 MED ORDER — ONDANSETRON HCL 4 MG/2ML IJ SOLN
4 MG/2ML | Freq: Four times a day (QID) | INTRAMUSCULAR | Status: DC | PRN
Start: 2014-03-15 — End: 2014-03-15

## 2014-03-15 MED ORDER — CLOPIDOGREL BISULFATE 75 MG PO TABS
75 MG | Freq: Every day | ORAL | Status: DC
Start: 2014-03-15 — End: 2014-03-15

## 2014-03-15 MED ORDER — NORMAL SALINE FLUSH 0.9 % IV SOLN
0.9 % | Freq: Two times a day (BID) | INTRAVENOUS | Status: DC
Start: 2014-03-15 — End: 2014-03-17
  Administered 2014-03-16 – 2014-03-17 (×4): 10 mL via INTRAVENOUS

## 2014-03-15 MED ORDER — ACETAMINOPHEN 325 MG PO TABS
325 MG | ORAL | Status: DC | PRN
Start: 2014-03-15 — End: 2014-03-17

## 2014-03-15 MED FILL — AMIODARONE HCL 150 MG/3ML IV SOLN: 150 MG/3ML | INTRAVENOUS | Qty: 9

## 2014-03-15 MED FILL — CLOPIDOGREL BISULFATE 75 MG PO TABS: 75 MG | ORAL | Qty: 1

## 2014-03-15 MED FILL — MORPHINE SULFATE (PF) 4 MG/ML IV SOLN: 4 mg/mL | INTRAVENOUS | Qty: 1

## 2014-03-15 MED FILL — ABC PLUS SENIOR PO TABS: ORAL | Qty: 1

## 2014-03-15 MED FILL — LISINOPRIL 2.5 MG PO TABS: 2.5 MG | ORAL | Qty: 1

## 2014-03-15 MED FILL — TYLENOL 325 MG PO TABS: 325 MG | ORAL | Qty: 2

## 2014-03-15 MED FILL — METOPROLOL TARTRATE 25 MG PO TABS: 25 MG | ORAL | Qty: 1

## 2014-03-15 MED FILL — ASPIRIN 325 MG PO TABS: 325 MG | ORAL | Qty: 1

## 2014-03-15 MED FILL — ALPRAZOLAM 0.25 MG PO TABS: 0.25 MG | ORAL | Qty: 1

## 2014-03-15 MED FILL — ATORVASTATIN CALCIUM 20 MG PO TABS: 20 MG | ORAL | Qty: 1

## 2014-03-15 NOTE — Progress Notes (Signed)
Reassessment completed and charted at this time. VSS. Family remains at bedside. No c/o pain or discomfort. Will continue to monitor. Electronically signed by Kristopher Glee, RN on 03/15/2014 at 1:21 AM

## 2014-03-15 NOTE — Progress Notes (Signed)
No changes. VSS. Family remains at bedside. Monitoring. Electronically signed by Kristopher Glee, RN on 03/15/2014 at 2:26 AM

## 2014-03-15 NOTE — Progress Notes (Signed)
[facility]  Progress Note                            Date:  03/15/2014  Patient: Gregory Escobar  Admission:  03/13/2014  5:07 AM  Admit DX: Acute MI ([place]) [I21.3]  Age:  44 y.o., January 03, 1971     LOS: 2 days     Reason for evaluation:   Acute anterior- lateral wall myocardial infarction complicated by ventricular                                                                                             tachycardia/ventricular fibrillation.      SUBJECTIVE:    The patient was seen and examined. Notes and labs reviewed.  There was an elevated temp over night.  Blood cultures drawn and pending.    Patient's cardiac review of systems: negative. No noted ectopy. Regular rate and rhythm.  Amiodarone gtt continues  The patient complaining of back pain and shoulder pain and irritability due to not smoking.        OBJECTIVE:    Telemetry: Sinus      03-13-14 cath    IMPRESSION   1. Left ventriculogram revealing apical dyskinesis with the overall ejection  fraction estimated to be 30% to 35%.   2. Total occlusion of the left anterior descending with revascularization  employing a 4.0 x 23 mm Xience drug-eluting stent.   3. Severe disease of an early arising obtuse marginal branch and acute  marginal branch of the nondominant right coronary artery.    BP 110/65 mmHg   Pulse 76   Temp(Src) 99.8 ??F (37.7 ??C) (Temporal)   Resp 19   Ht 5' 7"  (1.702 m)   Wt 166 lb (75.297 kg)   BMI 25.99 kg/m2   SpO2 96%    Intake/Output Summary (Last 24 hours) at 03/15/14 0850  Last data filed at 03/15/14 0600   Gross per 24 hour   Intake 2071.49 ml   Output   3070 ml   Net -998.51 ml       Labs:   CBC:   Recent Labs      03/14/14   1904  03/15/14   0138   WBC  12.6*  11.4*   HGB  13.4*  12.9*   HCT  39.7*  39.1*   PLT  197  184     BMP: Recent Labs      03/14/14   0135  03/15/14   0138   NA  137  135*   K  4.0  4.1   CO2  20*  23   BUN  17  9   CREATININE  0.7  0.6   LABGLOM  >60  >60   GLUCOSE  129*  103     BNP: No results  for input(s): BNP in the last 72 hours.  PT/INR: No results for input(s): PROTIME, INR in the last 72 hours.  APTT:No results for input(s): APTT in the last 72 hours.  CARDIAC ENZYMES:  Recent Labs  03/13/14   1205  03/14/14   0135  03/15/14   0138   TROPONINI  13.01*  6.04*  3.58*     FASTING LIPID PANEL:  Lab Results   Component Value Date    HDL 42 03/14/2014    LDLDIRECT 71 03/14/2014    LDLCALC 69 03/14/2014    TRIG 70 03/14/2014     LIVER PROFILE:  Recent Labs      03/14/14   0135   AST  233*   ALT  73*   LABALBU  3.4*       NURSE:  Monico Blitz, RN    Reason for initial evaluation    Cad/mi      Today's Current Status: none      These cardiovascular symptoms and signs are improving       PFSH:  New:  no       Change:  no    ROSS: New:  no        Change:  no      Physical Examination:  BP 98/60 mmHg   Pulse 68   Temp(Src) 98.1 ??F (36.7 ??C) (Temporal)   Resp 24   Ht 5' 7"  (1.702 m)   Wt 166 lb (75.297 kg)   BMI 25.99 kg/m2   SpO2 96%    CONSTITUTIONAL:  Awake, alert, cooperative, no apparent distress, and appears stated age.  HEENT: Normal jugular venous pulsations, no carotid bruits. Head is atraumatic, normocephalic. Eyes and oral mucosa are normal.  LUNGS: Respiratory effort for the work of breathing is normal Yes.  On auscultation: clear to auscultation bilaterally  CARDIOVASCULAR:  Normal apical impulse, regular rate and rhythm, normal S1 and S2, no S3 or S4, and no murmur or rub is noted.  ABDOMEN: Soft, non tender, non distended. Bowel sounds are present. No masses or tenderness.  SKIN: Warm and dry.  EXTREMITIES: No lower extremity edema.  No cyanosis or clubbing.  NEUROLOGY:  Motor movement grossly intact.  Focal signs are not identified.        Current Inpatient Medications:  ??? atorvastatin  20 mg Oral Nightly   ??? sodium chloride flush  10 mL Intravenous 2 times per day   ??? lisinopril  2.5 mg Oral BID   ??? aspirin  325 mg Oral Daily   ??? metoprolol  25 mg Oral BID   ??? clopidogrel  75 mg Oral  Daily       IV Infusions (if any):  ??? sodium chloride Stopped (03/15/14 1138)   ??? nitroGLYCERIN Stopped (03/14/14 1123)       Diagnostics:      Telemetry / EKG:      ASSESSMENT:    Patient Active Problem List    Diagnosis Date Noted   ??? Coronary artery disease involving native coronary artery 03/13/2014       PLAN:    1. Continue present medications  2. Continue to monitor rhythm  3.   Further orders per clinical course.   4.   pcu-d/c amio    Please see orders.  Discussed with patient and family and nursing.    Fredirick Maudlin, MD    [facility]

## 2014-03-15 NOTE — Progress Notes (Signed)
PATIENT REPORT GIVEN TO [place] RN IN PCU. PATIENT TRANSPORT CALLED AND NOTIFIED OF TRANSFER AS WELL AS PLACING IN THE COMPUTER.

## 2014-03-15 NOTE — Progress Notes (Signed)
Reassessment completed and charted at this time. VSS. Family remains at bedside. Will continue to monitor. Electronically signed by Kristopher Glee, RN on 03/15/2014 at 5:54 AM

## 2014-03-15 NOTE — Progress Notes (Signed)
Patient c/o seeing blood when he blew his nose. Told him to keep an eye on it and to notify the nurse if it continued. VSS. No other changes at this time. Will continue to monitor. Electronically signed by Kristopher Glee, RN on 03/15/2014 at 5:56 AM

## 2014-03-16 LAB — CBC WITH AUTO DIFFERENTIAL
Basophils %: 0.3 % (ref 0.0–1.0)
Basophils Absolute: 0 10*3/uL (ref 0.00–0.20)
Eosinophils %: 2.2 % (ref 0.0–5.0)
Eosinophils Absolute: 0.2 10*3/uL (ref 0.00–0.60)
Hematocrit: 38 % — ABNORMAL LOW (ref 42.0–52.0)
Hemoglobin: 12.6 g/dL — ABNORMAL LOW (ref 14.0–18.0)
Lymphocytes %: 18.6 % — ABNORMAL LOW (ref 20.0–40.0)
Lymphocytes Absolute: 1.4 10*3/uL (ref 1.1–4.5)
MCH: 28.6 pg (ref 27.0–31.0)
MCHC: 33.2 g/dL (ref 33.0–37.0)
MCV: 86.4 fL (ref 80.0–94.0)
MPV: 10.6 fL — ABNORMAL HIGH (ref 7.4–10.4)
Monocytes %: 13.4 % — ABNORMAL HIGH (ref 0.0–10.0)
Monocytes Absolute: 1 10*3/uL — ABNORMAL HIGH (ref 0.00–0.90)
Neutrophils %: 65.5 % — ABNORMAL HIGH (ref 50.0–65.0)
Neutrophils Absolute: 5 10*3/uL (ref 1.5–7.5)
Platelets: 185 10*3/uL (ref 130–400)
RBC: 4.4 M/uL — ABNORMAL LOW (ref 4.70–6.10)
RDW: 14 % (ref 11.5–14.5)
WBC: 7.6 10*3/uL (ref 4.8–10.8)

## 2014-03-16 LAB — EKG 12-LEAD
P Axis: 37 degrees
P Axis: 49 degrees
P Axis: 69 degrees
P-R Interval: 156 ms
P-R Interval: 156 ms
P-R Interval: 158 ms
Q-T Interval: 364 ms
Q-T Interval: 394 ms
Q-T Interval: 426 ms
QRS Duration: 72 ms
QRS Duration: 74 ms
QRS Duration: 88 ms
QTc Calculation (Bazett): 383 ms
QTc Calculation (Bazett): 393 ms
QTc Calculation (Bazett): 438 ms
T Axis: 58 degrees
T Axis: 62 degrees
T Axis: 74 degrees

## 2014-03-16 LAB — COMPREHENSIVE METABOLIC PANEL
ALT: 65 U/L — ABNORMAL HIGH (ref 5–41)
AST: 74 U/L — ABNORMAL HIGH (ref 5–40)
Albumin: 3.2 g/dL — ABNORMAL LOW (ref 3.5–5.2)
Alkaline Phosphatase: 56 U/L (ref 40–130)
Anion Gap: 14 mmol/L (ref 7–19)
BUN: 10 mg/dL (ref 6–20)
CO2: 22 mmol/L (ref 22–29)
Calcium: 8.9 mg/dL (ref 8.6–10.0)
Chloride: 102 mmol/L (ref 98–111)
Creatinine: 0.7 mg/dL (ref 0.6–0.9)
GFR Non-African American: >60 (ref >60)
Globulin: 3 g/dL
Glucose: 94 mg/dL (ref 74–109)
Potassium: 3.9 mmol/L (ref 3.5–5.1)
Sodium: 138 mmol/L (ref 136–145)
Total Bilirubin: 0.5 mg/dL (ref 0.2–1.2)
Total Protein: 6.2 g/dL — ABNORMAL LOW (ref 6.7–8.7)

## 2014-03-16 MED FILL — ALPRAZOLAM 0.25 MG PO TABS: 0.25 MG | ORAL | Qty: 1

## 2014-03-16 MED FILL — MORPHINE SULFATE (PF) 4 MG/ML IV SOLN: 4 mg/mL | INTRAVENOUS | Qty: 1

## 2014-03-16 MED FILL — METOPROLOL TARTRATE 25 MG PO TABS: 25 MG | ORAL | Qty: 1

## 2014-03-16 MED FILL — ABC PLUS SENIOR PO TABS: ORAL | Qty: 1

## 2014-03-16 MED FILL — ASPIRIN 325 MG PO TABS: 325 MG | ORAL | Qty: 1

## 2014-03-16 MED FILL — ATORVASTATIN CALCIUM 20 MG PO TABS: 20 MG | ORAL | Qty: 1

## 2014-03-16 MED FILL — LISINOPRIL 2.5 MG PO TABS: 2.5 MG | ORAL | Qty: 1

## 2014-03-16 MED FILL — TYLENOL 325 MG PO TABS: 325 MG | ORAL | Qty: 2

## 2014-03-16 MED FILL — CLOPIDOGREL BISULFATE 75 MG PO TABS: 75 MG | ORAL | Qty: 1

## 2014-03-16 NOTE — Discharge Summary (Signed)
DISCHARGE SUMMARY    ADMISSION DATE:  03/13/2014    DISCHARGE DATE:  03/17/2014    REASON FOR ADMISSION  Chest pain.    DISCHARGE DIAGNOSES  1.  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  2.  Tobacco abuse.  3.  Family history of atherosclerotic cardiovascular disease.  4.  History of renal calculi extraction.    HISTORY OF PRESENTING ILLNESS  The patient is a 44 year old white male who presented in transfer from the  [facility] for the evaluation of chest pain.  The patient was  awoken with pain in his chest radiating through his back and down his left  arms.  The discomfort was associated with diaphoresis and shortness of  breath.  When seen in the emergency room at [place], he had EKG evidence of  an injury pattern to the anterolateral wall.       HOSPITAL COURSE  The patient was admitted with the admitting labs being within normal limits,  except his CO2 of 20, glucose 129, total protein 5.7.  Troponin maximum was  13.  AST was 233, ALT 73.  Hemoglobin was 12.8, with hematocrit 38.4, with an  RBC of 4.45.    The patient was taken to the cardiac catheterization suite where he underwent  cardiac catheterization.  This study revealed anteroapical dyskinesis.  There  was total occlusion of the left anterior descending with mild-to-moderate  disease in the left circumflex and right coronary arteries.      The patient's postprocedure care has been uncomplicated.  He had an  intra-aortic balloon pump which was discontinued after a little more than 24  hours.  He is now ready for discharge.     DISCHARGE MEDICATIONS  For review of the discharge medications, please see the discharge medication  summary.    FOLLOWUP  He will be followed up in our office in 6 weeks.            ________________________________  Zoe Lan, MD    [id number]  DD: 03/16/2014 08:34  DT:  03/16/2014 09:24  SSI File#: [id number]  Job #: [id number]    cc:   Ermalene Postin, MD

## 2014-03-16 NOTE — Consults (Signed)
Client alert and oriented ambulating in his room.  Client attentive to education, asked appropriate questions and verbalized understanding.  Cardiac Rehab reviewed and encouraged.

## 2014-03-16 NOTE — Progress Notes (Signed)
[facility]  Progress Note                            Date:  03/16/2014  Patient: Gregory Escobar  Admission:  03/13/2014  5:07 AM  Admit DX: Acute MI ([place]) [I21.3]  Age:  44 y.o., 1971/01/01     LOS: 3 days     Reason for evaluation:  Acute anterior- lateral wall myocardial infarction complicated by ventricular  tachycardia/ventricular fibrillation      SUBJECTIVE:     Notes and labs reviewed.  There were not complications over night.            OBJECTIVE:    Telemetry: Sinus    03-13-14 cath    IMPRESSION   1. Left ventriculogram revealing apical dyskinesis with the overall ejection  fraction estimated to be 30% to 35%.   2. Total occlusion of the left anterior descending with revascularization  employing a 4.0 x 23 mm Xience drug-eluting stent.   3. Severe disease of an early arising obtuse marginal branch and acute  marginal branch of the nondominant right coronary artery.      BP 102/63 mmHg   Pulse 74   Temp(Src) 97.9 ??F (36.6 ??C) (Temporal)   Resp 18   Ht 5' 7"  (1.702 m)   Wt 164 lb 5 oz (74.532 kg)   BMI 25.73 kg/m2   SpO2 94%    Intake/Output Summary (Last 24 hours) at 03/16/14 7616  Last data filed at 03/16/14 0737   Gross per 24 hour   Intake 2425.85 ml   Output   2350 ml   Net  75.85 ml       Labs:   CBC:   Recent Labs      03/15/14   0138  03/16/14   0407   WBC  11.4*  7.6   HGB  12.9*  12.6*   HCT  39.1*  38.0*   PLT  184  185     BMP: Recent Labs      03/15/14   0138  03/16/14   0407   NA  135*  138   K  4.1  3.9   CO2  23  22   BUN  9  10   CREATININE  0.6  0.7   LABGLOM  >60  >60   GLUCOSE  103  94     BNP: No results for input(s): BNP in the last 72 hours.  PT/INR: No results for input(s): PROTIME, INR in the last 72 hours.  APTT:No results for input(s): APTT in the last 72 hours.  CARDIAC ENZYMES:  Recent Labs      03/13/14   1205  03/14/14   0135  03/15/14   0138   TROPONINI  13.01*  6.04*  3.58*     FASTING LIPID PANEL:  Lab Results   Component Value Date    HDL 42  03/14/2014    LDLDIRECT 71 03/14/2014    LDLCALC 69 03/14/2014    TRIG 70 03/14/2014     LIVER PROFILE:  Recent Labs      03/14/14   0135  03/16/14   0407   AST  233*  74*   ALT  73*  65*   LABALBU  3.4*  3.2*       NURSE:  Monico Blitz, RN    Reason for initial evaluation    Cad/mi  Today's Current Status: no chest pain or soa      These cardiovascular symptoms and signs show no change       PFSH:  New:  no       Change:  no    ROSS: New:  no        Change:  no      Physical Examination:  BP 102/63 mmHg   Pulse 74   Temp(Src) 97.9 ??F (36.6 ??C) (Temporal)   Resp 18   Ht 5' 7"  (1.702 m)   Wt 164 lb 5 oz (74.532 kg)   BMI 25.73 kg/m2   SpO2 94%    CONSTITUTIONAL:  Awake, alert, cooperative, no apparent distress, and appears stated age.  HEENT: Normal jugular venous pulsations, no carotid bruits. Head is atraumatic, normocephalic. Eyes and oral mucosa are normal.  LUNGS: Respiratory effort for the work of breathing is normal Yes.  On auscultation: clear to auscultation bilaterally  CARDIOVASCULAR:  Normal apical impulse, regular rate and rhythm, normal S1 and S2, no S3 or S4, and no murmur or rub is noted.  ABDOMEN: Soft, non tender, non distended. Bowel sounds are present. No masses or tenderness.  SKIN: Warm and dry.  EXTREMITIES: No lower extremity edema.  No cyanosis or clubbing.  NEUROLOGY:  Motor movement grossly intact.  Focal signs are not identified.        Current Inpatient Medications:  ??? therapeutic multivitamin-minerals  1 tablet Oral Daily   ??? sodium chloride flush  10 mL Intravenous 2 times per day   ??? atorvastatin  20 mg Oral Nightly   ??? lisinopril  2.5 mg Oral BID   ??? aspirin  325 mg Oral Daily   ??? metoprolol  25 mg Oral BID   ??? clopidogrel  75 mg Oral Daily       IV Infusions (if any):       Diagnostics:      Telemetry / EKG:     ASSESSMENT:    Patient Active Problem List    Diagnosis Date Noted   ??? Coronary artery disease involving native coronary artery 03/13/2014       PLAN:    1. Continue  present medications  2. Continue to monitor rhythm  3.   Further orders per clinical course.   4.   Increase activity-home in AM    Please see orders.  Discussed with patient and family and nursing.    Fredirick Maudlin, MD    [facility]

## 2014-03-17 MED FILL — ALPRAZOLAM 0.25 MG PO TABS: 0.25 MG | ORAL | Qty: 1

## 2014-03-17 MED FILL — ABC PLUS SENIOR PO TABS: ORAL | Qty: 1

## 2014-03-17 MED FILL — ATORVASTATIN CALCIUM 20 MG PO TABS: 20 MG | ORAL | Qty: 1

## 2014-03-17 MED FILL — LISINOPRIL 2.5 MG PO TABS: 2.5 MG | ORAL | Qty: 1

## 2014-03-17 MED FILL — ASPIRIN 325 MG PO TABS: 325 MG | ORAL | Qty: 1

## 2014-03-17 MED FILL — METOPROLOL TARTRATE 25 MG PO TABS: 25 MG | ORAL | Qty: 1

## 2014-03-17 MED FILL — CLOPIDOGREL BISULFATE 75 MG PO TABS: 75 MG | ORAL | Qty: 1

## 2014-03-17 MED FILL — MORPHINE SULFATE (PF) 4 MG/ML IV SOLN: 4 mg/mL | INTRAVENOUS | Qty: 1

## 2014-03-17 NOTE — Discharge Instructions (Signed)
Increase activity as tolerated.

## 2014-03-17 NOTE — Plan of Care (Signed)
Problem: Anxiety:  Goal: Level of anxiety will decrease  Level of anxiety will decrease   Outcome: Met This Shift    Problem: Tobacco Use:  Goal: Inpatient tobacco use cessation counseling participation  Inpatient tobacco use cessation counseling participation   Outcome: Ongoing    Problem: Discharge Planning:  Goal: Participates in care planning  Participates in care planning   Outcome: Ongoing  Goal: Discharged to appropriate level of care  Discharged to appropriate level of care   Outcome: Ongoing    Problem: Bowel Function - Altered:  Goal: Bowel elimination is within specified parameters  Bowel elimination is within specified parameters   Outcome: Ongoing    Problem: Pain:  Goal: Pain level will decrease  Pain level will decrease   Outcome: Ongoing  Goal: Recognizes and communicates pain  Recognizes and communicates pain   Outcome: Met This Shift  Goal: Control of acute pain  Control of acute pain   Outcome: Ongoing  Goal: Control of chronic pain  Control of chronic pain   Outcome: Ongoing    Problem: Sleep Pattern Disturbance:  Goal: Appears well-rested  Appears well-rested   Outcome: Met This Shift

## 2014-03-17 NOTE — Progress Notes (Signed)
[facility]  Progress Note                            Date:  03/17/2014  Patient: Gregory Escobar  Admission:  03/13/2014  5:07 AM  Admit DX: Acute MI ([place]) [I21.3]  Age:  44 y.o., 1971-02-14     LOS: 4 days     Reason for evaluation:   ischemic heart disease, arrhythmia, cardiomyopathy, coronary artery disease and coronary artery stent      SUBJECTIVE:    The patient was seen and examined. Notes and labs reviewed.  There were not complications over night.    Patient's cardiac review of systems: negative.  The patient is generally feeling rapidly improving.        OBJECTIVE:    Telemetry: Sinus  BP 99/63 mmHg   Pulse 63   Temp(Src) 97.6 ??F (36.4 ??Preslie Depasquale) (Temporal)   Resp 18   Ht 5' 7"  (1.702 m)   Wt 165 lb 1 oz (74.872 kg)   BMI 25.85 kg/m2   SpO2 94%    Intake/Output Summary (Last 24 hours) at 03/17/14 1920  Last data filed at 03/17/14 1431   Gross per 24 hour   Intake   1170 ml   Output    400 ml   Net    770 ml       Labs:   CBC:   Recent Labs      03/15/14   0138  03/16/14   0407   WBC  11.4*  7.6   HGB  12.9*  12.6*   HCT  39.1*  38.0*   PLT  184  185     BMP: Recent Labs      03/15/14   0138  03/16/14   0407   NA  135*  138   K  4.1  3.9   CO2  23  22   BUN  9  10   CREATININE  0.6  0.7   LABGLOM  >60  >60   GLUCOSE  103  94     BNP: No results for input(s): BNP in the last 72 hours.  PT/INR: No results for input(s): PROTIME, INR in the last 72 hours.  APTT:No results for input(s): APTT in the last 72 hours.  CARDIAC ENZYMES:  Recent Labs      03/15/14   0138   TROPONINI  3.58*     FASTING LIPID PANEL:  Lab Results   Component Value Date    HDL 42 03/14/2014    LDLDIRECT 71 03/14/2014    LDLCALC 69 03/14/2014    TRIG 70 03/14/2014     LIVER PROFILE:  Recent Labs      03/16/14   0407   AST  74*   ALT  65*   LABALBU  3.2*           PFSH:  New no        Change no    ROSS: New no        Change no      Physical Examination:  BP 99/63 mmHg   Pulse 63   Temp(Src) 97.6 ??F (36.4 ??Icesis Renn) (Temporal)    Resp 18   Ht 5' 7"  (1.702 m)   Wt 165 lb 1 oz (74.872 kg)   BMI 25.85 kg/m2   SpO2 94%    CONSTITUTIONAL:  Awake, alert, cooperative, no apparent distress, and appears  stated age.  HEENT: Normal jugular venous pulsations, no carotid bruits. Head is atraumatic, normocephalic. Eyes and oral mucosa are normal.  LUNGS: Respiratory effort for the work of breathing is normal Yes.  On auscultation: clear to auscultation bilaterally  CARDIOVASCULAR:  Normal apical impulse, regular rate and rhythm, normal S1 and S2, no S3 or S4, and no murmur or rub is noted.  ABDOMEN: Soft, nontender, nondistended. Bowel sounds are present. No masses or tenderness.  SKIN: Warm and dry.  EXTREMITIES: No lower extremity edema.  No cyanosis or clubbing.  NEUROLOGY:  Motor movement grossly intact.  Focal signs are not identified.        Current Inpatient Medications:  ??? therapeutic multivitamin-minerals  1 tablet Oral Daily   ??? sodium chloride flush  10 mL Intravenous 2 times per day   ??? atorvastatin  20 mg Oral Nightly   ??? lisinopril  2.5 mg Oral BID   ??? aspirin  325 mg Oral Daily   ??? metoprolol  25 mg Oral BID   ??? clopidogrel  75 mg Oral Daily       IV Infusions (if any):       Diagnostics:    EKG: normal sinus rhythm, unchanged from previous tracings.  ECHO: obtained and reviewed.   Stress Test: not obtained.  Cardiac Angiography: obtained and reviewed.    ASSESSMENT:    Patient Active Problem List    Diagnosis Date Noted   ??? Coronary artery disease involving native coronary artery 03/13/2014       PLAN:    1. Continue present medications  2. DC Home      Please see orders.  Discussed with patient and mother and nursing.    Myna Hidalgo, MD    [facility] [place]

## 2014-03-17 NOTE — Discharge Instructions (Signed)
Good nutrition is important when healing from an illness, injury, or surgery. Follow any nutrition recommendations given to you during the hospital stay. If you are instructed to take an oral nutrition supplement at home, you can take it with meals, in-between meals, and/or before bedtime. These supplements can be purchased at most local grocery stores, pharmacies, and chain super-stores. If you need help in covering the cost of your medication or oral supplement, visit www.RxAssist.org. If you have any questions about your diet or nutrition, call the hospital and ask for the dietitian.       Your nutrition orders at the time of discharge were:  DIET CARDIAC;.

## 2014-03-18 MED ORDER — CLOPIDOGREL BISULFATE 75 MG PO TABS
75 MG | ORAL_TABLET | Freq: Every day | ORAL | Status: DC
Start: 2014-03-18 — End: 2014-08-09

## 2014-03-18 MED ORDER — LISINOPRIL 2.5 MG PO TABS
2.5 MG | ORAL_TABLET | Freq: Two times a day (BID) | ORAL | Status: DC
Start: 2014-03-18 — End: 2014-08-09

## 2014-03-18 MED ORDER — ATORVASTATIN CALCIUM 20 MG PO TABS
20 MG | ORAL_TABLET | Freq: Every evening | ORAL | Status: DC
Start: 2014-03-18 — End: 2014-08-09

## 2014-03-18 MED ORDER — METOPROLOL TARTRATE 25 MG PO TABS
25 MG | ORAL_TABLET | Freq: Two times a day (BID) | ORAL | Status: DC
Start: 2014-03-18 — End: 2014-08-09

## 2014-03-18 MED FILL — ATORVASTATIN CALCIUM 20 MG PO TABS: 20 MG | ORAL | Qty: 1

## 2014-03-18 MED FILL — METOPROLOL TARTRATE 25 MG PO TABS: 25 MG | ORAL | Qty: 1

## 2014-03-18 MED FILL — LISINOPRIL 2.5 MG PO TABS: 2.5 MG | ORAL | Qty: 1

## 2014-03-20 LAB — CULTURE, BLOOD 2: Culture, Blood 2: NO GROWTH

## 2014-03-20 LAB — CULTURE BLOOD #1: Blood Culture, Routine: NO GROWTH

## 2014-03-29 NOTE — Telephone Encounter (Signed)
Patient called back today with correct address to send disability paper work.  Please send to:      Attn:  Aura Fey [address]   [employer]   [id number]   [employer]   [address] [address]   [place], [place] [address]

## 2014-04-25 ENCOUNTER — Ambulatory Visit: Admit: 2014-04-25 | Discharge: 2014-04-25 | Payer: BLUE CROSS/BLUE SHIELD | Attending: Family | Primary: Family Medicine

## 2014-04-25 DIAGNOSIS — I2510 Atherosclerotic heart disease of native coronary artery without angina pectoris: Secondary | ICD-10-CM

## 2014-04-25 NOTE — Progress Notes (Signed)
[facility], [place]  [address], [place]  [address]  Phone: [phone]  Fax: [phone]    OFFICE VISIT:  04/25/2014    Gregory Escobar - DOB: 1970/05/06    Reason For Visit:  Gregory Escobar is a 44 y.o. 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 Balloon pump and later eF 30-35%. He state that he is feeling better at this time  But is on FMLA. He states that he is taking his medications as prescribed, watching diet and sodium.    Subjective  The patient's PCP monitors lab for cholesterol. The patient denies any  cardiac complaints such as exertional chest pain, shortness of breath, orthopnea, paroxysmal nocturnal dyspnea, syncope, presyncope, sustained arrhythmia and edema. The patient denies numbness or weakness to suggest cerebrovascular accident or transient ischemic attack.     KOHLBY GILLIM has the following history as recorded in EpicCare:    Patient Active Problem List    Diagnosis Date Noted   . CAD (coronary artery disease)    . HTN (hypertension)    . Hyperlipemia    . Cardiac LV ejection fraction 30-35%    . Coronary artery disease involving native coronary artery 03/13/2014   . Acute MI, anterior wall (HCC) 03/13/2014     Past Medical History   Diagnosis Date   . CAD (coronary artery disease) 2016   . Kidney stones 05/2013   . HTN (hypertension)    . Hyperlipemia    . Cardiac LV ejection fraction 30-35%      MI 03/13/14 cath revealed EF 30-35 at that time   . Acute MI, anterior wall (HCC) 03/13/14     Past Surgical History   Procedure Laterality Date   . Lithotripsy  2015   . Ureter stent placement  2015     Family History   Problem Relation Age of Onset   . Heart Disease Father    . High Blood Pressure Father    . High Cholesterol Father      History   Substance Use Topics   . Smoking status: Current Every Day Smoker -- 0.25 packs/day for 20 years    . Smokeless tobacco: Former Neurosurgeon   . Alcohol Use: No      Comment: recovering addict      Current Outpatient Prescriptions   Medication Sig Dispense Refill   . atorvastatin (LIPITOR) 20 MG tablet Take 1 tablet by mouth nightly 30 tablet 3   . lisinopril (PRINIVIL;ZESTRIL) 2.5 MG tablet Take 1 tablet by mouth 2 times daily 30 tablet 3   . metoprolol (LOPRESSOR) 25 MG tablet Take 1 tablet by mouth 2 times daily 60 tablet 3   . clopidogrel (PLAVIX) 75 MG tablet Take 1 tablet by mouth daily 30 tablet 3   . Multiple Vitamins-Minerals (THERAPEUTIC MULTIVITAMIN-MINERALS) tablet Take 1 tablet by mouth daily       No current facility-administered medications for this visit.     Allergies: Review of patient's allergies indicates no known allergies.    Review of Systems  Constitutional - no significant activity change, appetite change, or unexpected weight change. No fever, chills or diaphoresis.  No fatigue.   HEENT - no significant rhinorrhea or epistaxis. No tinnitus or significant hearing loss.   Eyes - no sudden vision change or amaurosis. No corneal arcus, xantholasma,  subconjunctival hemorrhage or discharge.  Respiratory - no significant wheezing, stridor, apnea or cough.  No dyspnea on exertion or shortness of breath.  Cardiovascular - no exertional chest pain, orthopnea or PND.  No sensation of sustained arrythmia or slow heart rate.   No claudication or leg edema.  Gastrointestinal - no abdominal swelling or pain. No blood in stool. No severe constipation, diarrhea, nausea, or vomiting.   Genitourinary - no dysuria, frequency, or urgency. No flank pain or hematuria.   Musculoskeletal - no back pain, gait disturbance, or myalgia.    Extremities - no clubbing, cyanosis or edema.  Skin - no color change or rash.  No pallor.  No new surgical incision.  Neurologic - no speech difficulty, facial asymmetry or lateralizing weakness.  No seizures, presyncope, syncope, or significant dizziness.  Hematologic - no easy  bruising or excessive bleeding.   Psychiatric - no severe anxiety or insomnia. Normal affect.  No confusion. The patient denies any depression or suicidal ideation at this time.  All other review of systems are negative.      Objective  Vital Signs - BP 98/64 mmHg  Pulse 52  Ht 5\' 7"  (1.702 m)  Wt 151 lb (68.493 kg)  BMI 23.64 kg/m2  General - Rolen is alert, cooperative, and pleasant.  Well groomed.  No acute distress.    Body habitus - Body mass index is 23.64 kg/(m^2).  HEENT - Head is normocephalic. No circumoral cyanosis.  Dentition is normal.  EYES -   Lids normal without ptosis.  No discharge, edema or subconjunctival hemorrhage.   Neck - Symmetrical without apparent mass or lymphadenopathy.   Respiratory - Normal respiratory effort without use of accessory muscles.  Ausculatation reveals vesicular breath sounds without crackles, wheezes, rub or rhonchi.    Cardiovascular - No jugular venous distention.  Auscultation reveals regular rate and rhythm.  No audible clicks, gallop or rub.  No murmur.  No lower extremity varicosities.  No carotid bruits.  Peripheral pulses:   Abdominal -  No visible distention, mass or pulsations.  Extremities - No clubbing or cyanosis.  No statis dermatitis or ulcers. Noedema.    Musculoskeletal - Gait is even and regular without limp or shuffle.  No Osler's nodes.  No kyphosis or scoliosis.  Ambulates without assistance.  Skin -  Warm and dry; no rash or pallor.   No new surgical wound.  Neurological - No focal neurological deficits.  Thought processes coherent.  No apparent tremor.   Oriented to person, place and time.    Psychiatric -  Appropriate affect and mood.     Assessment:     Stable cardiovascular status. No evidence of overt heart failure, angina or dysrhythmia  1. Coronary artery disease involving native coronary artery of native heart without angina pectoris  ECHO Complete 2D W Doppler W Color   2. Essential hypertension     3. Mixed hyperlipidemia     4.  Cardiac LV ejection fraction 30-35%  ECHO Complete 2D W Doppler W Color   5. Acute MI, anterior wall [facility])         Patient Instructions       Learning About Coronary Artery Disease (CAD)  What is coronary artery disease?     Coronary artery disease (CAD) occurs when plaque builds up in the arteries that bring oxygen-rich blood to your heart. Plaque is a fatty substance made of cholesterol, calcium, and other substances in the blood. This process is called hardening of  the arteries, or atherosclerosis.  What happens when you have coronary artery disease?   Plaque may narrow the coronary arteries. Narrowed arteries cause poor blood flow. This can lead to angina symptoms such as chest pain or discomfort. If blood flow is completely blocked, you could have a heart attack.   You can slow CAD and reduce the risk of future problems by making changes in your lifestyle. These include quitting smoking and eating heart-healthy foods.   Treatments for CAD, along with changes in your lifestyle, can help you live a longer and healthier life.  How can you prevent coronary artery disease?   Do not smoke. It may be the best thing you can do to prevent heart disease. If you need help quitting, talk to your doctor about stop-smoking programs and medicines. These can increase your chances of quitting for good.   Be active. Get at least 30 minutes of exercise on most days of the week. Walking is a good choice. You also may want to do other activities, such as running, swimming, cycling, or playing tennis or team sports.   Eat heart-healthy foods. Eat more fruits and vegetables and less foods that contain saturated and trans fats. Limit alcohol, sodium, and sweets.   Stay at a healthy weight. Lose weight if you need to.   Manage other health problems such as diabetes, high blood pressure, and high cholesterol.   Talk to your doctor about taking a daily aspirin.   Manage stress. Stress can hurt your heart. To keep stress low,  talk about your problems and feelings. Don't keep your feelings hidden.  How is coronary artery disease treated?   Your doctor will suggest that you make lifestyle changes. For example, your doctor may ask you to eat healthy foods, quit smoking, lose extra weight, and be more active.   You will have to take medicines.   Your doctor may suggest a procedure to open narrowed or blocked arteries. This is called angioplasty. Or your doctor may suggest using healthy blood vessels to create detours around narrowed or blocked arteries. This is called bypass surgery.  Follow-up care is a key part of your treatment and safety. Be sure to make and go to all appointments, and call your doctor if you are having problems. It's also a good idea to know your test results and keep a list of the medicines you take.   Where can you learn more?   Go to [email or web address] and sign in to your MyChart account. Enter C643 in the Search Health Information box to learn more about "Learning About Coronary Artery Disease (CAD)."    If you do not have an account, please click on the "Sign Up Now" link.      2006-2015 [employer], Inc[employer]. Care instructions adapted under license by [facility]. This care instruction is for use with your licensed healthcare professional. If you have questions about a medical condition or this instruction, always ask your healthcare professional. Healthwise, Incorporated disclaims any warranty or liability for your use of this information.  Content Version: 10.6.465758; Current as of: April 21, 2013              A Healthy Heart: Care Instructions  Your Care Instructions     Heart disease occurs when a substance called plaque builds up in the vessels that supply oxygen-rich blood to your heart. This can narrow the blood vessels and reduce blood flow. A heart attack happens when blood flow is completely blocked. A high-fat  diet, smoking, and other factors increase the risk of heart  disease.  Your doctor has found that you have a chance of having heart disease. You can do lots of things to keep your heart healthy. It may not be easy, but you can change your diet, exercise more, and quit smoking. These steps really work to lower your chance of heart disease.  Follow-up care is a key part of your treatment and safety. Be sure to make and go to all appointments, and call your doctor if you are having problems. It's also a good idea to know your test results and keep a list of the medicines you take.  How can you care for yourself at home?  Diet   Use less salt when you cook and eat. This helps lower your blood pressure. Taste food before salting. Add only a little salt when you think you need it. With time, your taste buds will adjust to less salt.   Eat fewer snack items, fast foods, canned soups, and other high-salt, high-fat, processed foods.   Read food labels and try to avoid saturated and trans fats. They increase your risk of heart disease by raising cholesterol levels.   Limit the amount of solid fat-butter, margarine, and shortening-you eat. Use olive, peanut, or canola oil when you cook. Bake, broil, and steam foods instead of frying them.   Eating fish can lower your risk for heart disease. Eat at least 2 servings of fish a week. Salmon, mackerel, herring, sardines, and chunk light tuna are very good choices. These fish contain omega-3 fatty acids.   Eat a variety of fruit and vegetables every day. Dark green, deep orange, red, or yellow fruits and vegetables are especially good for you. Examples include spinach, carrots, peaches, and berries.   Foods high in fiber can reduce your cholesterol and provide important vitamins and minerals. High-fiber foods include whole-grain cereals and breads, oatmeal, beans, brown rice, citrus fruits, and apples.   Limit drinks and foods with added sugar. These include candy, desserts, and soda pop.  Lifestyle changes   If your doctor  recommends it, get more exercise. Walking is a good choice. Bit by bit, increase the amount you walk every day. Try for at least 30 minutes on most days of the week. You also may want to swim, bike, or do other activities.   Do not smoke. If you need help quitting, talk to your doctor about stop-smoking programs and medicines. These can increase your chances of quitting for good. Quitting smoking may be the most important step you can take to protect your heart. It is never too late to quit. You will get health benefits right away.   Limit alcohol to 2 drinks a day for men and 1 drink a day for women. Too much alcohol can cause health problems.  Medicines   Take your medicines exactly as prescribed. Call your doctor if you think you are having a problem with your medicine.   If your doctor recommends aspirin, take the amount directed each day. Make sure you take aspirin and not another kind of pain reliever, such as acetaminophen (Tylenol). If you take ibuprofen (such as Advil or Motrin) for other problems, take aspirin at least 2 hours before taking ibuprofen.  When should you call for help?  Call 911 if you have symptoms of a heart attack. These may include:   You have chest pain or pressure. This may occur with:   Chest pain or pressure,  or a strange feeling in the chest.   Sweating.   Shortness of breath.   Pain, pressure, or a strange feeling in the back, neck, jaw, or upper belly or in one or both shoulders or arms.   Lightheadedness or sudden weakness.   A fast or irregular heartbeat.  After you call 911, the operator may tell you to chew 1 adult-strength or 2 to 4 low-dose aspirin. Wait for an ambulance. Do not try to drive yourself.  Watch closely for changes in your health, and be sure to contact your doctor if:   Your symptoms are slowly getting worse.   You do not get better as expected.   Where can you learn more?   Go to [email or web address] and sign in to your MyChart  account. Enter [phone] in the Search Health Information box to learn more about "A Healthy Heart: Care Instructions."    If you do not have an account, please click on the "Sign Up Now" link.      2006-2015 Healthwise, Incorporated. Care instructions adapted under license by [facility]. This care instruction is for use with your licensed healthcare professional. If you have questions about a medical condition or this instruction, always ask your healthcare professional. Healthwise, Incorporated disclaims any warranty or liability for your use of this information.  Content Version: 10.6.465758; Current as of: April 21, 2013              High Cholesterol: Care Instructions  Your Care Instructions  Cholesterol is a type of fat in your blood. It is needed for many body functions, such as making new cells. Cholesterol is made by your body. It also comes from food you eat. High cholesterol means that you have too much of the fat in your blood. This raises your risk of a heart attack and stroke.  LDL and HDL are part of your total cholesterol. LDL is the "bad" cholesterol. High LDL can raise your risk for heart disease, heart attack, and stroke. HDL is the "good" cholesterol. It helps clear bad cholesterol from the body. High HDL is linked with a lower risk of heart disease, heart attack, and stroke.  Your cholesterol levels help your doctor find out your risk for having a heart attack or stroke. You and your doctor can talk about whether you need to lower your risk and what treatment is best for you.  A heart-healthy lifestyle along with medicines can help lower your cholesterol and your risk. The way you choose to lower your risk will depend on how high your risk is for heart attack and stroke. It will also depend on how you feel about taking medicines.  Follow-up care is a key part of your treatment and safety. Be sure to make and go to all appointments, and call your doctor if you are having problems. It's also a  good idea to know your test results and keep a list of the medicines you take.  How can you care for yourself at home?   Eat a variety of foods every day. Good choices include fruits, vegetables, whole grains (like oatmeal), dried beans and peas, nuts and seeds, soy products (like tofu), and fat-free or low-fat dairy products.   Replace butter, margarine, and hydrogenated or partially hydrogenated oils with olive and canola oils. (Canola oil margarine without trans fat is fine.)   Replace red meat with fish, poultry, and soy protein (like tofu).   Limit processed and packaged foods like chips, crackers, and  cookies.   Bake, broil, or steam foods. Don't fry them.   Limit foods high in cholesterol. These include egg yolks.   Be physically active. Get at least 30 minutes of exercise on most days of the week. Walking is a good choice. You also may want to do other activities, such as running, swimming, cycling, or playing tennis or team sports.   Stay at a healthy weight or lose weight by making the changes in eating and physical activity listed above. Losing just a small amount of weight, even 5 to 10 pounds, can reduce your risk for having a heart attack or stroke.   Do not smoke.  When should you call for help?  Watch closely for changes in your health, and be sure to contact your doctor if:   You need help making lifestyle changes.   You have questions about your medicine.   Where can you learn more?   Go to [email or web address] and sign in to your MyChart account. Enter [phone] in the Search Health Information box to learn more about "High Cholesterol: Care Instructions."    If you do not have an account, please click on the "Sign Up Now" link.      2006-2015 Healthwise, Incorporated. Care instructions adapted under license by [facility]. This care instruction is for use with your licensed healthcare professional. If you have questions about a medical condition or this instruction,  always ask your healthcare professional. Healthwise, Incorporated disclaims any warranty or liability for your use of this information.  Content Version: 10.6.465758; Current as of: April 21, 2013              High Blood Pressure: Care Instructions  Your Care Instructions  If your blood pressure is usually above 140/90, you have high blood pressure, or hypertension. Despite what a lot of people think, high blood pressure usually doesn't cause headaches or make you feel dizzy or lightheaded. It usually has no symptoms. But it does increase your risk for heart attack, stroke, and kidney or eye damage. The higher your blood pressure, the more your risk increases.  Your doctor will give you a goal for your blood pressure. Your goal will be based on your health and your age. An example of a goal is to keep your blood pressure below 140/90.  Lifestyle changes, such as eating healthy and being active, are always important to help lower blood pressure. You might also take medicine to reach your blood pressure goal.  Follow-up care is a key part of your treatment and safety. Be sure to make and go to all appointments, and call your doctor if you are having problems. It's also a good idea to know your test results and keep a list of the medicines you take.  How can you care for yourself at home?  Medical treatment   If you stop taking your medicine, your blood pressure will go back up. You may take one or more types of medicine to lower your blood pressure. Be safe with medicines. Take your medicine exactly as prescribed. Call your doctor if you think you are having a problem with your medicine.   Your doctor may suggest that you take one low-dose aspirin (81 mg) a day. This can help reduce your risk of having a stroke or heart attack.   See your doctor regularly. You may need to see the doctor more often at first or until your blood pressure comes down.   If you are taking blood  pressure medicine, talk to your doctor  before you take decongestants or anti-inflammatory medicine, such as ibuprofen. Some of these medicines can raise blood pressure.   Learn how to check your blood pressure at home.  Lifestyle changes   Stay at a healthy weight. This is especially important if you put on weight around the waist. Losing even 10 pounds can help you lower your blood pressure.   If your doctor recommends it, get more exercise. Walking is a good choice. Bit by bit, increase the amount you walk every day. Try for at least 30 minutes on most days of the week. You also may want to swim, bike, or do other activities.   Avoid or limit alcohol. Talk to your doctor about whether you can drink any alcohol.   Try to limit how much sodium you eat to less than 2,300 milligrams (mg) a day. Your doctor may ask you to try to eat less than 1,500 mg a day.   Eat plenty of fruits (such as bananas and oranges), vegetables, legumes, whole grains, and low-fat dairy products.   Lower the amount of saturated fat in your diet. Saturated fat is found in animal products such as milk, cheese, and meat. Limiting these foods may help you lose weight and also lower your risk for heart disease.   Do not smoke. Smoking increases your risk for heart attack and stroke. If you need help quitting, talk to your doctor about stop-smoking programs and medicines. These can increase your chances of quitting for good.  When should you call for help?  Call your doctor now or seek immediate medical care if:   Your blood pressure is much higher than normal (such as 180/110 or higher).   You think high blood pressure is causing symptoms such as:   Severe headache.   Blurry vision.  Watch closely for changes in your health, and be sure to contact your doctor if:   You do not get better as expected.   Where can you learn more?   Go to [email or web address] and sign in to your MyChart account. Enter [phone] in the Search Health Information box to learn more  about "High Blood Pressure: Care Instructions."    If you do not have an account, please click on the "Sign Up Now" link.      2006-2015 Healthwise, Inc[employer]. Care instructions adapted under license by [facility]. This care instruction is for use with your licensed healthcare professional. If you have questions about a medical condition or this instruction, always ask your healthcare professional. Health[employer], Inc[employer] disclaims any warranty or liability for your use of this information.  Content Version: 10.6.465758; Current as of: April 21, 2013            Register at the [facility] located on the first floor of [facility].   Enter through hospital main entrance and turn immediately to your left.    Phone:  [phone] (home)     Date/Time:     Echocardiogram  [order [id number]]    No prep.  Takes approximately 30 min.    An echocardiogram uses sound waves to produce images of your heart. This commonly used test allows your doctor to see how your heart is beating and pumping blood. Your doctor can use the images from an echocardiogram to identify various abnormalities in the heart muscle and valves.    This test has 2 parts:    You will be asked to disrobe  from the waist up and given a gown to wear. The technologist will then hook up an EKG monitor to you for the entire exam.    You will then have an ultrasound of your heart (echocardiogram) to assess the heart muscle, heart valves and heart function.     You may eat and take any medicines before the exam.     If you need to change your appointment, please call outpatient scheduling at [phone].              Plan  Continue current medications as prescribed.   Continue heart healthy diet.   Exercise as tolerated.  Strive for 15 minutes of exercise most days of the week.    Blood pressure goal is 140/90 or less. If you are a diabetic, the goal is 130/80 or less.   Continue to follow up with primary care provider  for medical concerns.   Call with any problems, questions or concerns.   Follow up as scheduled.  Educational material provided *see after visit summary    CAD-Cardiac protection and prevention along with medication/education given  MI acute anterior with direct infarct- EF 30-35% will repeat echo  HTN-stable CPT  Hyperlipidemia-managed per PCP      Additional instructions: As always patient advised to bring in medication bottles in order to correctly reconcile with our current list.    For any patient on anticoagulation or antiplatelets , they are advised to given no less that 10 days notice prior surgical procedure in order withhold medication(s)    If you are in need of any upcoming procedures or surgery and will be requesting cardiac clearance, you will need to notify our office no less than 2 weeks prior to the procedure     Chip Boer, APRN

## 2014-04-25 NOTE — Patient Instructions (Addendum)
Learning About Coronary Artery Disease (CAD)  What is coronary artery disease?     Coronary artery disease (CAD) occurs when plaque builds up in the arteries that bring oxygen-rich blood to your heart. Plaque is a fatty substance made of cholesterol, calcium, and other substances in the blood. This process is called hardening of the arteries, or atherosclerosis.  What happens when you have coronary artery disease?   Plaque may narrow the coronary arteries. Narrowed arteries cause poor blood flow. This can lead to angina symptoms such as chest pain or discomfort. If blood flow is completely blocked, you could have a heart attack.   You can slow CAD and reduce the risk of future problems by making changes in your lifestyle. These include quitting smoking and eating heart-healthy foods.   Treatments for CAD, along with changes in your lifestyle, can help you live a longer and healthier life.  How can you prevent coronary artery disease?   Do not smoke. It may be the best thing you can do to prevent heart disease. If you need help quitting, talk to your doctor about stop-smoking programs and medicines. These can increase your chances of quitting for good.   Be active. Get at least 30 minutes of exercise on most days of the week. Walking is a good choice. You also may want to do other activities, such as running, swimming, cycling, or playing tennis or team sports.   Eat heart-healthy foods. Eat more fruits and vegetables and less foods that contain saturated and trans fats. Limit alcohol, sodium, and sweets.   Stay at a healthy weight. Lose weight if you need to.   Manage other health problems such as diabetes, high blood pressure, and high cholesterol.   Talk to your doctor about taking a daily aspirin.   Manage stress. Stress can hurt your heart. To keep stress low, talk about your problems and feelings. Don't keep your feelings hidden.  How is coronary artery disease treated?   Your doctor will suggest  that you make lifestyle changes. For example, your doctor may ask you to eat healthy foods, quit smoking, lose extra weight, and be more active.   You will have to take medicines.   Your doctor may suggest a procedure to open narrowed or blocked arteries. This is called angioplasty. Or your doctor may suggest using healthy blood vessels to create detours around narrowed or blocked arteries. This is called bypass surgery.  Follow-up care is a key part of your treatment and safety. Be sure to make and go to all appointments, and call your doctor if you are having problems. It's also a good idea to know your test results and keep a list of the medicines you take.   Where can you learn more?   Go to [email or web address] and sign in to your MyChart account. Enter C643 in the Search Health Information box to learn more about "Learning About Coronary Artery Disease (CAD)."    If you do not have an account, please click on the "Sign Up Now" link.      2006-2015 [employer]. Care instructions adapted under license by [facility]. This care instruction is for use with your licensed healthcare professional. If you have questions about a medical condition or this instruction, always ask your healthcare professional. [employer], Inc[employer] disclaims any warranty or liability for your use of this information.  Content Version: 10.6.465758; Current as of: April 21, 2013  A Healthy Heart: Care Instructions  Your Care Instructions     Heart disease occurs when a substance called plaque builds up in the vessels that supply oxygen-rich blood to your heart. This can narrow the blood vessels and reduce blood flow. A heart attack happens when blood flow is completely blocked. A high-fat diet, smoking, and other factors increase the risk of heart disease.  Your doctor has found that you have a chance of having heart disease. You can do lots of things to keep your heart healthy. It may  not be easy, but you can change your diet, exercise more, and quit smoking. These steps really work to lower your chance of heart disease.  Follow-up care is a key part of your treatment and safety. Be sure to make and go to all appointments, and call your doctor if you are having problems. It's also a good idea to know your test results and keep a list of the medicines you take.  How can you care for yourself at home?  Diet   Use less salt when you cook and eat. This helps lower your blood pressure. Taste food before salting. Add only a little salt when you think you need it. With time, your taste buds will adjust to less salt.   Eat fewer snack items, fast foods, canned soups, and other high-salt, high-fat, processed foods.   Read food labels and try to avoid saturated and trans fats. They increase your risk of heart disease by raising cholesterol levels.   Limit the amount of solid fat-butter, margarine, and shortening-you eat. Use olive, peanut, or canola oil when you cook. Bake, broil, and steam foods instead of frying them.   Eating fish can lower your risk for heart disease. Eat at least 2 servings of fish a week. Salmon, mackerel, herring, sardines, and chunk light tuna are very good choices. These fish contain omega-3 fatty acids.   Eat a variety of fruit and vegetables every day. Dark green, deep orange, red, or yellow fruits and vegetables are especially good for you. Examples include spinach, carrots, peaches, and berries.   Foods high in fiber can reduce your cholesterol and provide important vitamins and minerals. High-fiber foods include whole-grain cereals and breads, oatmeal, beans, brown rice, citrus fruits, and apples.   Limit drinks and foods with added sugar. These include candy, desserts, and soda pop.  Lifestyle changes   If your doctor recommends it, get more exercise. Walking is a good choice. Bit by bit, increase the amount you walk every day. Try for at least 30 minutes on most  days of the week. You also may want to swim, bike, or do other activities.   Do not smoke. If you need help quitting, talk to your doctor about stop-smoking programs and medicines. These can increase your chances of quitting for good. Quitting smoking may be the most important step you can take to protect your heart. It is never too late to quit. You will get health benefits right away.   Limit alcohol to 2 drinks a day for men and 1 drink a day for women. Too much alcohol can cause health problems.  Medicines   Take your medicines exactly as prescribed. Call your doctor if you think you are having a problem with your medicine.   If your doctor recommends aspirin, take the amount directed each day. Make sure you take aspirin and not another kind of pain reliever, such as acetaminophen (Tylenol). If you take ibuprofen (such  as Advil or Motrin) for other problems, take aspirin at least 2 hours before taking ibuprofen.  When should you call for help?  Call 911 if you have symptoms of a heart attack. These may include:   You have chest pain or pressure. This may occur with:   Chest pain or pressure, or a strange feeling in the chest.   Sweating.   Shortness of breath.   Pain, pressure, or a strange feeling in the back, neck, jaw, or upper belly or in one or both shoulders or arms.   Lightheadedness or sudden weakness.   A fast or irregular heartbeat.  After you call 911, the operator may tell you to chew 1 adult-strength or 2 to 4 low-dose aspirin. Wait for an ambulance. Do not try to drive yourself.  Watch closely for changes in your health, and be sure to contact your doctor if:   Your symptoms are slowly getting worse.   You do not get better as expected.   Where can you learn more?   Go to [email or web address] and sign in to your MyChart account. Enter [phone] in the Search Health Information box to learn more about "A Healthy Heart: Care Instructions."    If you do not have an account,  please click on the "Sign Up Now" link.      2006-2015 [employer], [employer]. Care instructions adapted under license by [facility]. This care instruction is for use with your licensed healthcare professional. If you have questions about a medical condition or this instruction, always ask your healthcare professional. [employer], [employer] disclaims any warranty or liability for your use of this information.  Content Version: 10.6.465758; Current as of: April 21, 2013              High Cholesterol: Care Instructions  Your Care Instructions  Cholesterol is a type of fat in your blood. It is needed for many body functions, such as making new cells. Cholesterol is made by your body. It also comes from food you eat. High cholesterol means that you have too much of the fat in your blood. This raises your risk of a heart attack and stroke.  LDL and HDL are part of your total cholesterol. LDL is the "bad" cholesterol. High LDL can raise your risk for heart disease, heart attack, and stroke. HDL is the "good" cholesterol. It helps clear bad cholesterol from the body. High HDL is linked with a lower risk of heart disease, heart attack, and stroke.  Your cholesterol levels help your doctor find out your risk for having a heart attack or stroke. You and your doctor can talk about whether you need to lower your risk and what treatment is best for you.  A heart-healthy lifestyle along with medicines can help lower your cholesterol and your risk. The way you choose to lower your risk will depend on how high your risk is for heart attack and stroke. It will also depend on how you feel about taking medicines.  Follow-up care is a key part of your treatment and safety. Be sure to make and go to all appointments, and call your doctor if you are having problems. It's also a good idea to know your test results and keep a list of the medicines you take.  How can you care for yourself at home?   Eat a variety of foods every  day. Good choices include fruits, vegetables, whole grains (like oatmeal), dried beans and peas, nuts and seeds, soy products (like  tofu), and fat-free or low-fat dairy products.   Replace butter, margarine, and hydrogenated or partially hydrogenated oils with olive and canola oils. (Canola oil margarine without trans fat is fine.)   Replace red meat with fish, poultry, and soy protein (like tofu).   Limit processed and packaged foods like chips, crackers, and cookies.   Bake, broil, or steam foods. Don't fry them.   Limit foods high in cholesterol. These include egg yolks.   Be physically active. Get at least 30 minutes of exercise on most days of the week. Walking is a good choice. You also may want to do other activities, such as running, swimming, cycling, or playing tennis or team sports.   Stay at a healthy weight or lose weight by making the changes in eating and physical activity listed above. Losing just a small amount of weight, even 5 to 10 pounds, can reduce your risk for having a heart attack or stroke.   Do not smoke.  When should you call for help?  Watch closely for changes in your health, and be sure to contact your doctor if:   You need help making lifestyle changes.   You have questions about your medicine.   Where can you learn more?   Go to [email or web address] and sign in to your MyChart account. Enter [phone] in the Search Health Information box to learn more about "High Cholesterol: Care Instructions."    If you do not have an account, please click on the "Sign Up Now" link.      2006-2015 Healthwise, Inc[employer]. Care instructions adapted under license by [facility]. This care instruction is for use with your licensed healthcare professional. If you have questions about a medical condition or this instruction, always ask your healthcare professional. [employer], [employer] disclaims any warranty or liability for your use of this information.  Content Version:  10.6.465758; Current as of: April 21, 2013              High Blood Pressure: Care Instructions  Your Care Instructions  If your blood pressure is usually above 140/90, you have high blood pressure, or hypertension. Despite what a lot of people think, high blood pressure usually doesn't cause headaches or make you feel dizzy or lightheaded. It usually has no symptoms. But it does increase your risk for heart attack, stroke, and kidney or eye damage. The higher your blood pressure, the more your risk increases.  Your doctor will give you a goal for your blood pressure. Your goal will be based on your health and your age. An example of a goal is to keep your blood pressure below 140/90.  Lifestyle changes, such as eating healthy and being active, are always important to help lower blood pressure. You might also take medicine to reach your blood pressure goal.  Follow-up care is a key part of your treatment and safety. Be sure to make and go to all appointments, and call your doctor if you are having problems. It's also a good idea to know your test results and keep a list of the medicines you take.  How can you care for yourself at home?  Medical treatment   If you stop taking your medicine, your blood pressure will go back up. You may take one or more types of medicine to lower your blood pressure. Be safe with medicines. Take your medicine exactly as prescribed. Call your doctor if you think you are having a problem with your medicine.   Your doctor may  suggest that you take one low-dose aspirin (81 mg) a day. This can help reduce your risk of having a stroke or heart attack.   See your doctor regularly. You may need to see the doctor more often at first or until your blood pressure comes down.   If you are taking blood pressure medicine, talk to your doctor before you take decongestants or anti-inflammatory medicine, such as ibuprofen. Some of these medicines can raise blood pressure.   Learn how to check  your blood pressure at home.  Lifestyle changes   Stay at a healthy weight. This is especially important if you put on weight around the waist. Losing even 10 pounds can help you lower your blood pressure.   If your doctor recommends it, get more exercise. Walking is a good choice. Bit by bit, increase the amount you walk every day. Try for at least 30 minutes on most days of the week. You also may want to swim, bike, or do other activities.   Avoid or limit alcohol. Talk to your doctor about whether you can drink any alcohol.   Try to limit how much sodium you eat to less than 2,300 milligrams (mg) a day. Your doctor may ask you to try to eat less than 1,500 mg a day.   Eat plenty of fruits (such as bananas and oranges), vegetables, legumes, whole grains, and low-fat dairy products.   Lower the amount of saturated fat in your diet. Saturated fat is found in animal products such as milk, cheese, and meat. Limiting these foods may help you lose weight and also lower your risk for heart disease.   Do not smoke. Smoking increases your risk for heart attack and stroke. If you need help quitting, talk to your doctor about stop-smoking programs and medicines. These can increase your chances of quitting for good.  When should you call for help?  Call your doctor now or seek immediate medical care if:   Your blood pressure is much higher than normal (such as 180/110 or higher).   You think high blood pressure is causing symptoms such as:   Severe headache.   Blurry vision.  Watch closely for changes in your health, and be sure to contact your doctor if:   You do not get better as expected.   Where can you learn more?   Go to [email or web address] and sign in to your MyChart account. Enter [phone] in the Search Health Information box to learn more about "High Blood Pressure: Care Instructions."    If you do not have an account, please click on the "Sign Up Now" link.      2006-2015 Healthwise,  Incorporated. Care instructions adapted under license by [facility]. This care instruction is for use with your licensed healthcare professional. If you have questions about a medical condition or this instruction, always ask your healthcare professional. [employer], [employer] disclaims any warranty or liability for your use of this information.  Content Version: 10.6.465758; Current as of: April 21, 2013            Register at the [facility] located on the first floor of [facility].   Enter through hospital main entrance and turn immediately to your left.    Phone:  [phone] (home)     Date/Time:     Echocardiogram  [order [id number]]    No prep.  Takes approximately 30 min.    An echocardiogram uses sound waves to produce images of  your heart. This commonly used test allows your doctor to see how your heart is beating and pumping blood. Your doctor can use the images from an echocardiogram to identify various abnormalities in the heart muscle and valves.    This test has 2 parts:    You will be asked to disrobe from the waist up and given a gown to wear. The technologist will then hook up an EKG monitor to you for the entire exam.    You will then have an ultrasound of your heart (echocardiogram) to assess the heart muscle, heart valves and heart function.     You may eat and take any medicines before the exam.     If you need to change your appointment, please call outpatient scheduling at [phone].

## 2014-05-01 ENCOUNTER — Inpatient Hospital Stay: Admit: 2014-05-01 | Payer: BLUE CROSS/BLUE SHIELD | Primary: Family Medicine

## 2014-05-01 DIAGNOSIS — I2510 Atherosclerotic heart disease of native coronary artery without angina pectoris: Secondary | ICD-10-CM

## 2014-05-02 NOTE — Procedures (Signed)
ECHOCARDIOGRAM REPORT    CARDIOLOGIST:  Zoe Lan, MD    PROCEDURE DATE: 05/01/2014    QUALITY STATEMENT  This is a technically difficult study.    TWO-DIMENSIONAL AND M-MODE FINDINGS  1.  The aortic root appears to be normal in size.    2.  The aortic valve is tricuspid with grossly normal thickness and mobility.     3.  The mitral valve leaflets are of normal thickness and mobility.    4.  The tricuspid valve leaflets are of normal thickness and mobility.    5.  The left ventricle is mildly enlarged.  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    6.  The right ventricle is normal is size.    7.  The right atrium is normal in size.    8.  The left atrium is mildly enlarged.    9.  No pericardial effusion is detected.    DOPPLER/COLOR FLOW STUDIES  1.  Color flow across the mitral valve fails to reveal significant mitral  insufficiency or evidence of possible diastolic dysfunction.    2.  Color flow across the aortic valve fails to reveal aortic stenosis or  aortic insufficiency.    3.  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          ________________________________  Zoe Lan, MD    [id number]  DD: 05/02/2014 07:22  DT: 05/02/2014 14:02  SSI File#: [id number]  Job #:[id number] /  [id number]    cc:   Ermalene Postin  ,

## 2014-05-23 ENCOUNTER — Ambulatory Visit
Admit: 2014-05-23 | Discharge: 2014-05-23 | Payer: BLUE CROSS/BLUE SHIELD | Attending: Cardiovascular Disease | Primary: Family Medicine

## 2014-05-23 DIAGNOSIS — I2510 Atherosclerotic heart disease of native coronary artery without angina pectoris: Secondary | ICD-10-CM

## 2014-05-23 NOTE — Patient Instructions (Addendum)
Heart-Healthy Diet: Care Instructions  Your Care Instructions     A heart-healthy diet has lots of vegetables, fruits, nuts, beans, and whole grains, and is low in salt. It limits foods that are high in saturated fat, such as meats, cheeses, and fried foods. It may be hard to change your diet, but even small changes can lower your risk of heart attack and heart disease.  Follow-up care is a key part of your treatment and safety. Be sure to make and go to all appointments, and call your doctor if you are having problems. It's also a good idea to know your test results and keep a list of the medicines you take.  How can you care for yourself at home?  Watch your portions   Learn what a serving is. A "serving" and a "portion" are not always the same thing. Make sure that you are not eating larger portions than are recommended. For example, a serving of pasta is  cup. A serving size of meat is 2 to 3 ounces. A 3-ounce serving is about the size of a deck of cards. Measure serving sizes until you are good at "eyeballing" them. Keep in mind that restaurants often serve portions that are 2 or 3 times the size of one serving.   To keep your energy level up and keep you from feeling hungry, eat often but in smaller portions.   Eat only the number of calories you need to stay at a healthy weight. If you need to lose weight, eat fewer calories than your body burns (through exercise and other physical activity).  Eat more fruits and vegetables   Eat a variety of fruit and vegetables every day. Dark green, deep orange, red, or yellow fruits and vegetables are especially good for you. Examples include spinach, carrots, peaches, and berries.   Keep carrots, celery, and other veggies handy for snacks. Buy fruit that is in season and store it where you can see it so that you will be tempted to eat it.   Cook dishes that have a lot of veggies in them, such as stir-fries and soups.  Limit saturated and trans fat   Read food  labels, and try to avoid saturated and trans fats. They increase your risk of heart disease. Trans fat is found in many processed foods such as cookies and crackers.   Use olive or canola oil when you cook. Try cholesterol-lowering spreads, such as Benecol or Take Control.   Bake, broil, grill, or steam foods instead of frying them.   Choose lean meats instead of high-fat meats such as hot dogs and sausages. Cut off all visible fat when you prepare meat.   Eat fish, skinless poultry, and meat alternatives such as soy products instead of high-fat meats. Soy products, such as tofu, may be especially good for your heart.   Choose low-fat or fat-free milk and dairy products.  Eat fish   Eat at least two servings of fish a week. Certain fish, such as salmon and tuna, contain omega-3 fatty acids, which may help reduce your risk of heart attack.  Eat foods high in fiber   Eat a variety of grain products every day. Include whole-grain foods that have lots of fiber and nutrients. Examples of whole-grain foods include oats, whole wheat bread, and brown rice.   Buy whole-grain breads and cereals, instead of white bread or pastries.  Limit salt and sodium   Limit how much salt and sodium you eat to  help lower your blood pressure.   Taste food before you salt it. Add only a little salt when you think you need it. With time, your taste buds will adjust to less salt.   Eat fewer snack items, fast foods, and other high-salt, processed foods. Check food labels for the amount of sodium in packaged foods.   Choose low-sodium versions of canned goods (such as soups, vegetables, and beans).  Limit sugar   Limit drinks and foods with added sugar. These include candy, desserts, and soda pop.  Limit alcohol   Limit alcohol to no more than 2 drinks a day for men and 1 drink a day for women. Too much alcohol can cause health problems.  When should you call for help?  Watch closely for changes in your health, and be sure to  contact your doctor if:   You would like help planning heart-healthy meals.   Where can you learn more?   Go to [email or web address] and sign in to your MyChart account. Enter V137 in the Search Health Information box to learn more about "Heart-Healthy Diet: Care Instructions."    If you do not have an account, please click on the "Sign Up Now" link.      2006-2015 [employer]. Care instructions adapted under license by [facility]. This care instruction is for use with your licensed healthcare professional. If you have questions about a medical condition or this instruction, always ask your healthcare professional. [employer] disclaims any warranty or liability for your use of this information.  Content Version: 10.6.465758; Current as of: April 21, 2013       Please remember to bring all of your medications bottles to your appointment so that we can update your medication list.  Include all medications you take (including prescriptions, over-the-counter, vitamins, and herbals).  We ask that you bring your original medication bottles, as it is difficult to identify medications based on color and shape alone.      We are committed to providing you with the best care possible.  If you receive a survey after visiting one of our offices, please take time to share your experience concerning your physician office visit. These surveys are confidential and no health information about you is shared. We are eager to improve for you and we are counting on your feedback to help make that happen.     Allergies:  Review of patient's allergies indicates no known allergies.

## 2014-05-24 NOTE — Progress Notes (Signed)
The following was transcribed by Suella Broad, M.T.    [facility] [address] [place], [address], [place]  [address]    Gregory Escobar  Office Visit:  05/23/2014   DOB: 02-07-1971, 44 y.o. male  Gregory Clark, MD     Chief Complaint   Patient presents with   . 1 Month Follow-Up     Patient is here for 1 month follow up and to get results on last echo, patient denies any cardiac complaints or concerns.  Patient states he is having a hard time sleeping and he is wondering what he can take for a headache.    . Coronary Artery Disease        HISTORY OF PRESENT ILLNESS  Mr. Gregory Escobar presents today for routine follow up.  He is seen here for coronary artery disease.  Andreus presents doing very well.  He is back to work and eating healthy.  There is no angina, no overt heart failure, and no syncope.      Patient Active Problem List   Diagnosis Code   . Coronary artery disease involving native coronary artery I25.10   . CAD (coronary artery disease) I25.10   . HTN (hypertension) I10   . Hyperlipemia E78.5   . Cardiac LV ejection fraction 30-35% R94.30   . 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 CAD (coronary artery disease) 2016   . Kidney stones 05/2013   . HTN (hypertension)    . Hyperlipemia    . Cardiac LV ejection fraction 30-35%      MI 03/13/14 cath revealed EF 30-35 at that time   . 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 Lithotripsy  2015   . Ureter stent placement  2015       History   Substance Use Topics   . Smoking status: Current Every Day Smoker -- 0.25 packs/day for 20 years   . Smokeless tobacco: Former Neurosurgeon   . Alcohol Use: No      Comment: recovering addict       Family History   Problem Relation Age of Onset   . Heart Disease Father    . High Blood Pressure Father    . High Cholesterol Father        [Allergies/Contraindications:  Review of  patient's allergies indicates no known allergies.]     Outpatient Prescriptions Marked as Taking for the 05/23/14 encounter (Office Visit) with Gregory Clark, MD   Medication Sig Dispense Refill   . atorvastatin (LIPITOR) 20 MG tablet Take 1 tablet by mouth nightly 30 tablet 3   . lisinopril (PRINIVIL;ZESTRIL) 2.5 MG tablet Take 1 tablet by mouth 2 times daily 30 tablet 3   . metoprolol (LOPRESSOR) 25 MG tablet Take 1 tablet by mouth 2 times daily 60 tablet 3   . clopidogrel (PLAVIX) 75 MG tablet Take 1 tablet by mouth daily 30 tablet 3   . Multiple Vitamins-Minerals (THERAPEUTIC MULTIVITAMIN-MINERALS) tablet Take 1 tablet by mouth daily         REVIEW OF SYSTEMS  Constitutional:  No fevers, chills, night sweats, or weight change.  HENT:  Negative for nosebleeds, facial swelling, rhinorrhea and neck stiffness.  RESPIRATORY:  No shortness of breath, cough, or sputum production.  No wheezing or stridor.   CARDIOVASCULAR:  There is no angina, no overt heart failure, and no syncope.  GASTROINTESTINAL:   Negative for abdominal distention.  GENITOURINARY:  Negative for dysuria, urgency and frequency.  MUSCULOSKELETAL:   Negative for myalgia, arthralgia and gait problem.  SKIN:  Negative for color change, pallor, rash and wound.  NEUROLOGICAL:   Negative for dizziness, syncope and lightheadedness.    HEMATOLOGICAL:   Does not bruise or bleed easily.  PSYCHIATRIC/BEHAVIORAL:   No excessive anxiety or confusion.       PHYSICAL EXAMINATION  GENERAL:  Alert and oriented x3 in no apparent distress.  Short-term and long-term memory intact.  Judgment intact.  Oriented to time, place and person.  No depression, anxiety or agitation.  VITAL SIGNS:  BP 108/62 mmHg  Pulse 78  Ht 5\' 7"  (1.702 m)  Wt 151 lb (68.493 kg)  BMI 23.64 kg/m2   HEAD:  Normocephalic without evidence of old or recent trauma.  EARS:  Tympanic membranes not visualized.    EYES:  Sclerae clear.  Conjunctivae pink.  EOMs intact.  Pupils equal and  round.  NOSE:  Negative nasal discharge or epistaxis.  MOUTH:  Teeth, gums and palate normal.   THROAT:  No lesions on lips or buccal mucosa.  Tongue protrudes in midline and is well papillated.  NECK:  Supple without mass or JVD.  Carotid pulses 2+ to palpation bilaterally without bruit.  No thyromegaly noted.    CHEST:  Equal bilateral expansion.  RESPIRATORY:  Clear to auscultation and percussion.  Normal respiratory effort.    CARDIOVASCULAR:  The heart's rhythm is regular.  No cardiac murmur heard upon auscultation.      ABDOMEN:  Soft, nontender.  Bowel sounds are normal.  No hepatosplenomegaly or palpable mass.  UPPER EXTREMITY EVALUATION:  Radial pulses palpable bilaterally.  No cyanosis, clubbing or edema.   LOWER EXTREMITY EVALUATION:  Femoral, popliteal, dorsalis pedis, and posterior tibialis pulses 2+ to palpation bilaterally.  No cyanosis, clubbing, or peripheral edema.    SKIN:  Warm and dry.     GASTROINTESTINAL:  Stool sample not pertinent.  MUSCULOSKELETAL:  Normal muscle strength and tone.  No atrophy or abnormalities.  SKIN:  Warm, dry, intact.  No dermatitis or ulcers.  NEUROLOGIC:  Cranial nerves II through XII are grossly intact.      IMPRESSION  1.  Stable cardiovascular status.    PLAN  1.  We will see him back in six months and encouraged him to get rid of all the cigarettes.    2.  Continue same medications.        Judith Blonder. Chase Picket, M.D., Ph.D., F.A.C.C.  [facility]     cc (pcp): [name], M.D.

## 2014-08-09 ENCOUNTER — Encounter

## 2014-08-09 MED ORDER — METOPROLOL TARTRATE 25 MG PO TABS
25 MG | ORAL_TABLET | Freq: Two times a day (BID) | ORAL | Status: DC
Start: 2014-08-09 — End: 2015-01-11

## 2014-08-09 MED ORDER — CLOPIDOGREL BISULFATE 75 MG PO TABS
75 MG | ORAL_TABLET | Freq: Every day | ORAL | Status: DC
Start: 2014-08-09 — End: 2015-01-11

## 2014-08-09 MED ORDER — ATORVASTATIN CALCIUM 20 MG PO TABS
20 MG | ORAL_TABLET | Freq: Every evening | ORAL | Status: DC
Start: 2014-08-09 — End: 2015-01-11

## 2014-08-09 MED ORDER — LISINOPRIL 2.5 MG PO TABS
2.5 MG | ORAL_TABLET | Freq: Two times a day (BID) | ORAL | Status: DC
Start: 2014-08-09 — End: 2015-01-11

## 2014-11-27 ENCOUNTER — Ambulatory Visit: Admit: 2014-11-27 | Discharge: 2014-11-27 | Payer: BLUE CROSS/BLUE SHIELD | Attending: Family | Primary: Family Medicine

## 2014-11-27 DIAGNOSIS — I2510 Atherosclerotic heart disease of native coronary artery without angina pectoris: Secondary | ICD-10-CM

## 2014-11-27 NOTE — Patient Instructions (Addendum)
Continue current medications as prescribed.   Continue to follow up with primary care provider for medical concerns.    Call with any problems, questions or concerns.    Follow up as scheduled with cardiology.    Additional patient instructions:  You will be due for cholesterol check in January 2017.  Risk factors for heart disease or progression of the disease:  Cigarette use, high cholesterol, high blood pressure, obesity and diabetes.  Continue heart healthy and low sodium diet.  Exercise as tolerated.  Strive for 15 minutes of exercise most days of the week.    Blood pressure goal is 140/90 or less. If you are a diabetic, the goal is 130/80 or less.  You may be asked to keep a blood pressure log.  If so, call the office to report readings in 2 weeks at [phone].  If you are taking cholesterol lowering medications, it is recommended that lab work be checked annually.    Always keep a current medication list.  Bring your medications to every office visit.   The following educational material has been included in this after visit summary for your review: heart health and heart disease.    How to take:  NITROGLYCERIN (Nitrostat) 0.4 mg tablets, sublingual.  Nitroglycerin is in a group of drugs called nitrates. Nitroglycerin dilates (widens) blood vessels, making it easier for blood to flow through them and easier for the heart to pump.    Dosing Guidelines for Nitroglycerin Tablets   At the start of an angina (chest pain) attack, place one tablet under the tongue or between the cheek and gum.  Do not swallow or chew the tablet; let it dissolve on its own.  If necessary, a second and third tablet may be used, with five minutes between using each tablet.  If you use a third tablet and your chest pain continues, it is time to seek immediate medical attention.  Call 911 immediately and have someone drive you to the emergency room.  You may be having a heart attack or other serious heart problem.   To prevent  angina from exercise or stress, use 1 tablet 5 to 10 minutes before the activity.    Learning About Coronary Artery Disease (CAD)  What is coronary artery disease?     Coronary artery disease (CAD) occurs when plaque builds up in the arteries that bring oxygen-rich blood to your heart. Plaque is a fatty substance made of cholesterol, calcium, and other substances in the blood. This process is called hardening of the arteries, or atherosclerosis.  What happens when you have coronary artery disease?   Plaque may narrow the coronary arteries. Narrowed arteries cause poor blood flow. This can lead to angina symptoms such as chest pain or discomfort. If blood flow is completely blocked, you could have a heart attack.   You can slow CAD and reduce the risk of future problems by making changes in your lifestyle. These include quitting smoking and eating heart-healthy foods.   Treatments for CAD, along with changes in your lifestyle, can help you live a longer and healthier life.  How can you prevent coronary artery disease?   Do not smoke. It may be the best thing you can do to prevent heart disease. If you need help quitting, talk to your doctor about stop-smoking programs and medicines. These can increase your chances of quitting for good.   Be active. Get at least 30 minutes of exercise on most days of the week. Walking is  a good choice. You also may want to do other activities, such as running, swimming, cycling, or playing tennis or team sports.   Eat heart-healthy foods. Eat more fruits and vegetables and less foods that contain saturated and trans fats. Limit alcohol, sodium, and sweets.   Stay at a healthy weight. Lose weight if you need to.   Manage other health problems such as diabetes, high blood pressure, and high cholesterol.   Manage stress. Stress can hurt your heart. To keep stress low, talk about your problems and feelings. Don't keep your feelings hidden.   If you have talked about it with your  doctor, take a low-dose aspirin every day. Aspirin can help certain people lower their risk of a heart attack or stroke. But taking aspirin isn't right for everyone, because it can cause serious bleeding. Do not start taking daily aspirin unless your doctor knows about it.  How is coronary artery disease treated?   Your doctor will suggest that you make lifestyle changes. For example, your doctor may ask you to eat healthy foods, quit smoking, lose extra weight, and be more active.   You will have to take medicines.   Your doctor may suggest a procedure to open narrowed or blocked arteries. This is called angioplasty. Or your doctor may suggest using healthy blood vessels to create detours around narrowed or blocked arteries. This is called bypass surgery.  Follow-up care is a key part of your treatment and safety. Be sure to make and go to all appointments, and call your doctor if you are having problems. It's also a good idea to know your test results and keep a list of the medicines you take.  Where can you learn more?  Go to [email or web address] and sign in to your MyChart account. Enter C643 in the Search Health Information box to learn more about "Learning About Coronary Artery Disease (CAD)."    If you do not have an account, please click on the "Sign Up Now" link.   2006-2016 [employer]. Care instructions adapted under license by [facility]. This care instruction is for use with your licensed healthcare professional. If you have questions about a medical condition or this instruction, always ask your healthcare professional. [employer] disclaims any warranty or liability for your use of this information.  Content Version: 10.9.538570; Current as of: March 28, 2014    A Healthy Heart: Care Instructions  Your Care Instructions     Heart disease occurs when a substance called plaque builds up in the vessels that supply oxygen-rich blood to your heart. This  can narrow the blood vessels and reduce blood flow. A heart attack happens when blood flow is completely blocked. A high-fat diet, smoking, and other factors increase the risk of heart disease.  Your doctor has found that you have a chance of having heart disease. You can do lots of things to keep your heart healthy. It may not be easy, but you can change your diet, exercise more, and quit smoking. These steps really work to lower your chance of heart disease.  Follow-up care is a key part of your treatment and safety. Be sure to make and go to all appointments, and call your doctor if you are having problems. It's also a good idea to know your test results and keep a list of the medicines you take.  How can you care for yourself at home?  Diet   Use less salt when you cook and eat. This helps  lower your blood pressure. Taste food before salting. Add only a little salt when you think you need it. With time, your taste buds will adjust to less salt.   Eat fewer snack items, fast foods, canned soups, and other high-salt, high-fat, processed foods.   Read food labels and try to avoid saturated and trans fats. They increase your risk of heart disease by raising cholesterol levels.   Limit the amount of solid fat-butter, margarine, and shortening-you eat. Use olive, peanut, or canola oil when you cook. Bake, broil, and steam foods instead of frying them.   Eating fish can lower your risk for heart disease. Eat at least 2 servings of fish a week. Salmon, mackerel, herring, sardines, and chunk light tuna are very good choices. These fish contain omega-3 fatty acids.   Eat a variety of fruit and vegetables every day. Dark green, deep orange, red, or yellow fruits and vegetables are especially good for you. Examples include spinach, carrots, peaches, and berries.   Foods high in fiber can reduce your cholesterol and provide important vitamins and minerals. High-fiber foods include whole-grain cereals and breads,  oatmeal, beans, brown rice, citrus fruits, and apples.   Limit drinks and foods with added sugar. These include candy, desserts, and soda pop.  Lifestyle changes   If your doctor recommends it, get more exercise. Walking is a good choice. Bit by bit, increase the amount you walk every day. Try for at least 30 minutes on most days of the week. You also may want to swim, bike, or do other activities.   Do not smoke. If you need help quitting, talk to your doctor about stop-smoking programs and medicines. These can increase your chances of quitting for good. Quitting smoking may be the most important step you can take to protect your heart. It is never too late to quit. You will get health benefits right away.   Limit alcohol to 2 drinks a day for men and 1 drink a day for women. Too much alcohol can cause health problems.  Medicines   Take your medicines exactly as prescribed. Call your doctor if you think you are having a problem with your medicine.   If your doctor recommends aspirin, take the amount directed each day. Make sure you take aspirin and not another kind of pain reliever, such as acetaminophen (Tylenol). If you take ibuprofen (such as Advil or Motrin) for other problems, take aspirin at least 2 hours before taking ibuprofen.  When should you call for help?  Call 911 if you have symptoms of a heart attack. These may include:   You have chest pain or pressure. This may occur with:   Chest pain or pressure, or a strange feeling in the chest.   Sweating.   Shortness of breath.   Pain, pressure, or a strange feeling in the back, neck, jaw, or upper belly or in one or both shoulders or arms.   Lightheadedness or sudden weakness.   A fast or irregular heartbeat.  After you call 911, the operator may tell you to chew 1 adult-strength or 2 to 4 low-dose aspirin. Wait for an ambulance. Do not try to drive yourself.  Watch closely for changes in your health, and be sure to contact your doctor  if:   Your symptoms are slowly getting worse.   You do not get better as expected.   Where can you learn more?   Go to [email or web address] and sign in to your MyChart account. Enter  F075 in the Search Health Information box to learn more about "A Healthy Heart: Care Instructions."    If you do not have an account, please click on the "Sign Up Now" link.    2006-2016 [employer]. Care instructions adapted under license by [facility]. This care instruction is for use with your licensed healthcare professional. If you have questions about a medical condition or this instruction, always ask your healthcare professional. [employer] disclaims any warranty or liability for your use of this information.  Content Version: 10.8.513193; Current as of: April 21, 2013    Stopping Smoking: Care Instructions  Your Care Instructions  Cigarette smokers crave the nicotine in cigarettes. Giving it up is much harder than simply changing a habit. Your body has to stop craving the nicotine. It is hard to quit, but you can do it. There are many tools that people use to quit smoking. You may find that combining tools works best for you.  There are several steps to quitting. First you get ready to quit. Then you get support to help you. After that, you learn new skills and behaviors to become a nonsmoker. For many people, a necessary step is getting and using medicine.  Your doctor will help you set up the plan that best meets your needs. You may want to attend a smoking cessation program to help you quit smoking. When you choose a program, look for one that has proven success. Ask your doctor for ideas. You will greatly increase your chances of success if you take medicine as well as get counseling or join a cessation program.  Some of the changes you feel when you first quit tobacco are uncomfortable. Your body will miss the nicotine at first, and you may feel short-tempered and  grumpy. You may have trouble sleeping or concentrating. Medicine can help you deal with these symptoms. You may struggle with changing your smoking habits and rituals. The last step is the tricky one: Be prepared for the smoking urge to continue for a time. This is a lot to deal with, but keep at it. You will feel better.  Follow-up care is a key part of your treatment and safety. Be sure to make and go to all appointments, and call your doctor if you are having problems. It's also a good idea to know your test results and keep a list of the medicines you take.  How can you care for yourself at home?   Ask your family, friends, and coworkers for support. You have a better chance of quitting if you have help and support.   Join a support group, such as Nicotine Anonymous, for people who are trying to quit smoking.   Consider signing up for a smoking cessation program, such as the American Lung Association's Freedom from Smoking program.   Set a quit date. Pick your date carefully so that it is not right in the middle of a big deadline or stressful time. Once you quit, do not even take a puff. Get rid of all ashtrays and lighters after your last cigarette. Clean your house and your clothes so that they do not smell of smoke.   Learn how to be a nonsmoker. Think about ways you can avoid those things that make you reach for a cigarette.   Avoid situations that put you at greatest risk for smoking. For some people, it is hard to have a drink with friends without smoking. For others, they might skip a coffee  break with coworkers who smoke.   Change your daily routine. Take a different route to work or eat a meal in a different place.   Cut down on stress. Calm yourself or release tension by doing an activity you enjoy, such as reading a book, taking a hot bath, or gardening.   Talk to your doctor or pharmacist about nicotine replacement therapy, which replaces the nicotine in your body. You still get nicotine but  you do not use tobacco. Nicotine replacement products help you slowly reduce the amount of nicotine you need. These products come in several forms, many of them available over-the-counter:   Nicotine patches   Nicotine gum and lozenges   Nicotine inhaler   Ask your doctor about bupropion (Wellbutrin) or varenicline (Chantix), which are prescription medicines. They do not contain nicotine. They help you by reducing withdrawal symptoms, such as stress and anxiety.   Some people find hypnosis, acupuncture, and massage helpful for ending the smoking habit.   Eat a healthy diet and get regular exercise. Having healthy habits will help your body move past its craving for nicotine.   Be prepared to keep trying. Most people are not successful the first few times they try to quit. Do not get mad at yourself if you smoke again. Make a list of things you learned and think about when you want to try again, such as next week, next month, or next year.  Where can you learn more?  Go to [email or web address] and sign in to your MyChart account. Enter [phone] in the Search Health Information box to learn more about "Stopping Smoking: Care Instructions."    If you do not have an account, please click on the "Sign Up Now" link.   2006-2016 [employer], [employer]. Care instructions adapted under license by [facility]. This care instruction is for use with your licensed healthcare professional. If you have questions about a medical condition or this instruction, always ask your healthcare professional. [employer] disclaims any warranty or liability for your use of this information.  Content Version: 10.9.538570; Current as of: October 20, 2013      QUIT NOW [place] SMOKING CESSATION PROGRAM    How Do I Get Started  Quitting tobacco is a process. The first step is the hardest. When you are ready to quit, Quit Now [place] can help you with each step in the quitting process.  The Program  Quit Now  [place] is a FREE online service available to [place] residents 42 years of age and over. When you become a member, you get special tools, a support team of coaches, research-based information, and a community of others trying to become tobacco free. Our expert coaches can talk to you about overcoming common barriers, such as dealing with stress, fighting cravings, coping with irritability, and controlling weight gain.  We also offer a free telephone service, so you can speak to a coach in person, if you would prefer. Call the [facility] [place] or [phone].   What if I don't qualify for the Program?  You must be 52 years of age or older to participate in the program. It is also helpful to discuss quitting with your doctor.   How do I enroll in the Quit Now [place] online program?  Complete the brief Enroll Now form. If you are a [place] resident over 53 years of age, you will receive an email letting you know that you are enrolled. You can use the online service as often as you  want!   How do I enroll in both the online and telephone program?  Complete the brief Enroll Now form. If you qualify, you will receive an email letting you know that you are enrolled. You can use the online service as often as you want!  While completing the Enroll Now form you indicate the best time for a coach to give you a call. If you would like, you can call the QuitLine at 1-800-QUIT-NOW.  We're here 7 days a week.   Monday - Sunday 7 a.m. - 12 a.m. CST  Monday - Sunday 8 a.m. - 1 a.m. EST  If you call when we are closed, please leave a message and we will call you back.

## 2014-11-27 NOTE — Progress Notes (Signed)
[facility], [place].  [facility]  [address], [place]  [address]  [phone] office  [phone] fax      OFFICE VISIT:  11/27/2014    Gregory Escobar - DOB: 03-26-1970    Reason For Visit:  Gregory Escobar is a 44 y.o. male who is here for 6 Month Follow-Up (6 mo f/u pt states no cardiac symptoms present); Hypertension; Coronary Artery Disease; and Medication Refill    The patient presents today for cardiology follow up.  Overall, the patient is doing well from a cardiac standpoint without symptoms to suggest progression of CAD.  BP is well controlled on current medications.  The patient's PCP monitors cholesterol.    Subjective  Gregory Escobar denies exertional chest pain, shortness of breath, orthopnea, paroxysmal nocturnal dyspnea, syncope, presyncope, sustained arrythmia, edema and fatigue.  The patient denies numbness or weakness to suggest cerebrovascular accident or transient ischemic attack.      Gregory Escobar has the following history as recorded in EpicCare:    Patient Active Problem List   Diagnosis Code   . Coronary artery disease involving native coronary artery I25.10   . CAD (coronary artery disease) I25.10   . HTN (hypertension) I10   . Hyperlipemia E78.5   . Cardiac LV ejection fraction 30-35% R94.30   . 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 CAD (coronary artery disease) 2016   . Cardiac LV ejection fraction 30-35%      MI 03/13/14 cath revealed EF 30-35 at that time   . HTN (hypertension)    . Hyperlipemia    . Kidney stones 05/2013     Past Surgical History   Procedure Laterality Date   . Lithotripsy  2015   . Ureter stent placement  2015     Family History   Problem Relation Age of Onset   . Heart Disease Father    . High Blood Pressure Father    . High Cholesterol Father      Social History   Substance Use Topics   . Smoking status: Current Every Day Smoker      Packs/day: 0.25     Years: 20.00   . Smokeless tobacco: Former Neurosurgeon   . 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 atorvastatin (LIPITOR) 20 MG tablet Take 1 tablet by mouth nightly 30 tablet 5   . clopidogrel (PLAVIX) 75 MG tablet Take 1 tablet by mouth daily 30 tablet 5   . lisinopril (PRINIVIL;ZESTRIL) 2.5 MG tablet Take 1 tablet by mouth 2 times daily 30 tablet 5   . metoprolol (LOPRESSOR) 25 MG tablet Take 1 tablet by mouth 2 times daily 60 tablet 5   . Multiple Vitamins-Minerals (THERAPEUTIC MULTIVITAMIN-MINERALS) tablet Take 1 tablet by mouth daily       No current facility-administered medications for this visit.      Allergies: Review of patient's allergies indicates no known allergies.    Review of Systems  Constitutional - no appetite change, or unexpected weight change. No fever, chills or diaphoresis.  No significant change in activity level or new onset of fatigue.   HEENT - no significant rhinorrhea or epistaxis. No tinnitus or significant hearing loss.   Eyes -  no sudden vision change or amaurosis. No corneal arcus, xantholasma, subconjunctival hemorrhage or discharge.  Respiratory - no significant wheezing, stridor, apnea or cough.  No dyspnea on exertion or shortness of air.  Cardiovascular - no exertional chest pain to suggest myocardial ischemia.  No orthopnea or PND.  No sensation of sustained arrythmia.  No occurrence of slow heart rate.  No palpitations.  No claudication.  No leg edema.  Gastrointestinal - no abdominal swelling or pain. No blood in stool. No severe constipation, diarrhea, nausea, or vomiting.   Genitourinary - no dysuria, frequency, or urgency. No flank pain or hematuria.   Musculoskeletal - no back pain or myalgia.  No problems with gait.  Extremities - no clubbing, cyanosis or edema.  Skin - no color change or rash.  No pallor.  No new surgical incision.   Neurologic - no speech difficulty, facial  asymmetry or lateralizing weakness.  No seizures, presyncope or syncope.  No significant dizziness.  Hematologic - no easy bruising or excessive bleeding.   Psychiatric - no severe anxiety or insomnia.  No confusion.   All other review of systems are negative.    Objective  Vital Signs -   Visit Vitals   . BP 124/76 (Site: Left Arm, Position: Sitting, Cuff Size: Medium Adult)   . Pulse 78   . Resp 18   . Ht 5\' 7"  (1.702 m)   . Wt 149 lb 12.8 oz (67.9 kg)   . BMI 23.46 kg/m2     General - Gregory Escobar is alert, cooperative, and pleasant.  Well groomed.  No acute distress.    Body habitus - Body mass index is 23.46 kg/(m^2).  HEENT - Head is normocephalic. No circumoral cyanosis.  Dentition is normal.  EYES -   Lids normal without ptosis.  No discharge, edema or subconjunctival hemorrhage.   Neck - Symmetrical without apparent mass or lymphadenopathy.   Respiratory - Normal respiratory effort without use of accessory muscles.  Ausculatation reveals vesicular breath sounds without crackles, wheezes, rub or rhonchi.    Cardiovascular - No jugular venous distention.  Auscultation reveals regular rate and rhythm.  No audible clicks, gallop or rub.  No murmur.  No lower extremity varicosities.  No carotid bruits.    Abdominal -  No visible distention, mass or pulsations.  Extremities - No clubbing or cyanosis.  No statis dermatitis or ulcers. No edema.    Musculoskeletal -   No Osler's nodes.  No kyphosis or scoliosis.  Gait is even and regular without limp or shuffle. Ambulates without assistance.  Skin -  Warm and dry; no rash or pallor.   No new surgical wound.  Neurological - No focal neurological deficits.  Thought processes coherent.  No apparent tremor.   Oriented to person, place and time.    Psychiatric -  Appropriate affect and mood.     Assessment:    1. Coronary artery disease involving native coronary artery of native heart without angina pectoris     2. Essential hypertension     3. Mixed hyperlipidemia     4.  Cardiac LV ejection fraction 30-35%       BP Readings from Last 3 Encounters:   11/27/14 124/76   05/23/14 108/62   04/25/14 98/64    Pulse Readings from Last 3 Encounters:   11/27/14 78   05/23/14 78   04/25/14 52        Wt Readings from Last 3 Encounters:   11/27/14 149 lb  12.8 oz (67.9 kg)   05/23/14 151 lb (68.5 kg)   04/25/14 151 lb (68.5 kg)     Plan  Previous cardiac history and records reviewed.  Continue current medications as prescribed.   Continue to follow up with primary care provider for medical concerns.    Call with any problems, questions or concerns.    Follow up as scheduled with cardiology.    Additional patient instructions:  You will be due for cholesterol check in January 2017.  Risk factors for heart disease or progression of the disease:  Cigarette use, high cholesterol, high blood pressure, obesity and diabetes.  Continue heart healthy and low sodium diet.  Exercise as tolerated.  Strive for 15 minutes of exercise most days of the week.    Blood pressure goal is 140/90 or less. If you are a diabetic, the goal is 130/80 or less.  You may be asked to keep a blood pressure log.  If so, call the office to report readings in 2 weeks at [phone].  If you are taking cholesterol lowering medications, it is recommended that lab work be checked annually.    Always keep a current medication list.  Bring your medications to every office visit.   The following educational material has been included in this after visit summary for your review: heart health and heart disease.    How to take:  NITROGLYCERIN (Nitrostat) 0.4 mg tablets, sublingual.  Nitroglycerin is in a group of drugs called nitrates. Nitroglycerin dilates (widens) blood vessels, making it easier for blood to flow through them and easier for the heart to pump.    Dosing Guidelines for Nitroglycerin Tablets   At the start of an angina (chest pain) attack, place one tablet under the tongue or between the cheek and gum.  Do not swallow or  chew the tablet; let it dissolve on its own.  If necessary, a second and third tablet may be used, with five minutes between using each tablet.  If you use a third tablet and your chest pain continues, it is time to seek immediate medical attention.  Call 911 immediately and have someone drive you to the emergency room.  You may be having a heart attack or other serious heart problem.   To prevent angina from exercise or stress, use 1 tablet 5 to 10 minutes before the activity.    Learning About Coronary Artery Disease (CAD)  What is coronary artery disease?     Coronary artery disease (CAD) occurs when plaque builds up in the arteries that bring oxygen-rich blood to your heart. Plaque is a fatty substance made of cholesterol, calcium, and other substances in the blood. This process is called hardening of the arteries, or atherosclerosis.  What happens when you have coronary artery disease?   Plaque may narrow the coronary arteries. Narrowed arteries cause poor blood flow. This can lead to angina symptoms such as chest pain or discomfort. If blood flow is completely blocked, you could have a heart attack.   You can slow CAD and reduce the risk of future problems by making changes in your lifestyle. These include quitting smoking and eating heart-healthy foods.   Treatments for CAD, along with changes in your lifestyle, can help you live a longer and healthier life.  How can you prevent coronary artery disease?   Do not smoke. It may be the best thing you can do to prevent heart disease. If you need help quitting, talk to your doctor about stop-smoking  programs and medicines. These can increase your chances of quitting for good.   Be active. Get at least 30 minutes of exercise on most days of the week. Walking is a good choice. You also may want to do other activities, such as running, swimming, cycling, or playing tennis or team sports.   Eat heart-healthy foods. Eat more fruits and vegetables and less foods  that contain saturated and trans fats. Limit alcohol, sodium, and sweets.   Stay at a healthy weight. Lose weight if you need to.   Manage other health problems such as diabetes, high blood pressure, and high cholesterol.   Manage stress. Stress can hurt your heart. To keep stress low, talk about your problems and feelings. Don't keep your feelings hidden.   If you have talked about it with your doctor, take a low-dose aspirin every day. Aspirin can help certain people lower their risk of a heart attack or stroke. But taking aspirin isn't right for everyone, because it can cause serious bleeding. Do not start taking daily aspirin unless your doctor knows about it.  How is coronary artery disease treated?   Your doctor will suggest that you make lifestyle changes. For example, your doctor may ask you to eat healthy foods, quit smoking, lose extra weight, and be more active.   You will have to take medicines.   Your doctor may suggest a procedure to open narrowed or blocked arteries. This is called angioplasty. Or your doctor may suggest using healthy blood vessels to create detours around narrowed or blocked arteries. This is called bypass surgery.  Follow-up care is a key part of your treatment and safety. Be sure to make and go to all appointments, and call your doctor if you are having problems. It's also a good idea to know your test results and keep a list of the medicines you take.  Where can you learn more?  Go to [email or web address] and sign in to your MyChart account. Enter C643 in the Search Health Information box to learn more about "Learning About Coronary Artery Disease (CAD)."    If you do not have an account, please click on the "Sign Up Now" link.   2006-2016 Healthwise, Inc[employer]. Care instructions adapted under license by [facility]. This care instruction is for use with your licensed healthcare professional. If you have questions about a medical condition or this  instruction, always ask your healthcare professional. Healthwise, Incorporated disclaims any warranty or liability for your use of this information.  Content Version: 10.9.538570; Current as of: March 28, 2014    A Healthy Heart: Care Instructions  Your Care Instructions     Heart disease occurs when a substance called plaque builds up in the vessels that supply oxygen-rich blood to your heart. This can narrow the blood vessels and reduce blood flow. A heart attack happens when blood flow is completely blocked. A high-fat diet, smoking, and other factors increase the risk of heart disease.  Your doctor has found that you have a chance of having heart disease. You can do lots of things to keep your heart healthy. It may not be easy, but you can change your diet, exercise more, and quit smoking. These steps really work to lower your chance of heart disease.  Follow-up care is a key part of your treatment and safety. Be sure to make and go to all appointments, and call your doctor if you are having problems. It's also a good idea to know your test results and  keep a list of the medicines you take.  How can you care for yourself at home?  Diet   Use less salt when you cook and eat. This helps lower your blood pressure. Taste food before salting. Add only a little salt when you think you need it. With time, your taste buds will adjust to less salt.   Eat fewer snack items, fast foods, canned soups, and other high-salt, high-fat, processed foods.   Read food labels and try to avoid saturated and trans fats. They increase your risk of heart disease by raising cholesterol levels.   Limit the amount of solid fat-butter, margarine, and shortening-you eat. Use olive, peanut, or canola oil when you cook. Bake, broil, and steam foods instead of frying them.   Eating fish can lower your risk for heart disease. Eat at least 2 servings of fish a week. Salmon, mackerel, herring, sardines, and chunk light tuna are very good  choices. These fish contain omega-3 fatty acids.   Eat a variety of fruit and vegetables every day. Dark green, deep orange, red, or yellow fruits and vegetables are especially good for you. Examples include spinach, carrots, peaches, and berries.   Foods high in fiber can reduce your cholesterol and provide important vitamins and minerals. High-fiber foods include whole-grain cereals and breads, oatmeal, beans, brown rice, citrus fruits, and apples.   Limit drinks and foods with added sugar. These include candy, desserts, and soda pop.  Lifestyle changes   If your doctor recommends it, get more exercise. Walking is a good choice. Bit by bit, increase the amount you walk every day. Try for at least 30 minutes on most days of the week. You also may want to swim, bike, or do other activities.   Do not smoke. If you need help quitting, talk to your doctor about stop-smoking programs and medicines. These can increase your chances of quitting for good. Quitting smoking may be the most important step you can take to protect your heart. It is never too late to quit. You will get health benefits right away.   Limit alcohol to 2 drinks a day for men and 1 drink a day for women. Too much alcohol can cause health problems.  Medicines   Take your medicines exactly as prescribed. Call your doctor if you think you are having a problem with your medicine.   If your doctor recommends aspirin, take the amount directed each day. Make sure you take aspirin and not another kind of pain reliever, such as acetaminophen (Tylenol). If you take ibuprofen (such as Advil or Motrin) for other problems, take aspirin at least 2 hours before taking ibuprofen.  When should you call for help?  Call 911 if you have symptoms of a heart attack. These may include:   You have chest pain or pressure. This may occur with:   Chest pain or pressure, or a strange feeling in the chest.   Sweating.   Shortness of breath.   Pain, pressure, or a  strange feeling in the back, neck, jaw, or upper belly or in one or both shoulders or arms.   Lightheadedness or sudden weakness.   A fast or irregular heartbeat.  After you call 911, the operator may tell you to chew 1 adult-strength or 2 to 4 low-dose aspirin. Wait for an ambulance. Do not try to drive yourself.  Watch closely for changes in your health, and be sure to contact your doctor if:   Your symptoms are slowly  getting worse.   You do not get better as expected.   Where can you learn more?   Go to [email or web address] and sign in to your MyChart account. Enter [phone] in the Search Health Information box to learn more about "A Healthy Heart: Care Instructions."    If you do not have an account, please click on the "Sign Up Now" link.    2006-2016 Healthwise, Inc[employer]. Care instructions adapted under license by [facility]. This care instruction is for use with your licensed healthcare professional. If you have questions about a medical condition or this instruction, always ask your healthcare professional. Healthwise, Incorporated disclaims any warranty or liability for your use of this information.  Content Version: 10.8.513193; Current as of: April 21, 2013    Stopping Smoking: Care Instructions  Your Care Instructions  Cigarette smokers crave the nicotine in cigarettes. Giving it up is much harder than simply changing a habit. Your body has to stop craving the nicotine. It is hard to quit, but you can do it. There are many tools that people use to quit smoking. You may find that combining tools works best for you.  There are several steps to quitting. First you get ready to quit. Then you get support to help you. After that, you learn new skills and behaviors to become a nonsmoker. For many people, a necessary step is getting and using medicine.  Your doctor will help you set up the plan that best meets your needs. You may want to attend a smoking cessation program to help  you quit smoking. When you choose a program, look for one that has proven success. Ask your doctor for ideas. You will greatly increase your chances of success if you take medicine as well as get counseling or join a cessation program.  Some of the changes you feel when you first quit tobacco are uncomfortable. Your body will miss the nicotine at first, and you may feel short-tempered and grumpy. You may have trouble sleeping or concentrating. Medicine can help you deal with these symptoms. You may struggle with changing your smoking habits and rituals. The last step is the tricky one: Be prepared for the smoking urge to continue for a time. This is a lot to deal with, but keep at it. You will feel better.  Follow-up care is a key part of your treatment and safety. Be sure to make and go to all appointments, and call your doctor if you are having problems. It's also a good idea to know your test results and keep a list of the medicines you take.  How can you care for yourself at home?   Ask your family, friends, and coworkers for support. You have a better chance of quitting if you have help and support.   Join a support group, such as Nicotine Anonymous, for people who are trying to quit smoking.   Consider signing up for a smoking cessation program, such as the American Lung Association's Freedom from Smoking program.   Set a quit date. Pick your date carefully so that it is not right in the middle of a big deadline or stressful time. Once you quit, do not even take a puff. Get rid of all ashtrays and lighters after your last cigarette. Clean your house and your clothes so that they do not smell of smoke.   Learn how to be a nonsmoker. Think about ways you can avoid those things that make you reach for a cigarette.  Avoid situations that put you at greatest risk for smoking. For some people, it is hard to have a drink with friends without smoking. For others, they might skip a coffee break with coworkers  who smoke.   Change your daily routine. Take a different route to work or eat a meal in a different place.   Cut down on stress. Calm yourself or release tension by doing an activity you enjoy, such as reading a book, taking a hot bath, or gardening.   Talk to your doctor or pharmacist about nicotine replacement therapy, which replaces the nicotine in your body. You still get nicotine but you do not use tobacco. Nicotine replacement products help you slowly reduce the amount of nicotine you need. These products come in several forms, many of them available over-the-counter:   Nicotine patches   Nicotine gum and lozenges   Nicotine inhaler   Ask your doctor about bupropion (Wellbutrin) or varenicline (Chantix), which are prescription medicines. They do not contain nicotine. They help you by reducing withdrawal symptoms, such as stress and anxiety.   Some people find hypnosis, acupuncture, and massage helpful for ending the smoking habit.   Eat a healthy diet and get regular exercise. Having healthy habits will help your body move past its craving for nicotine.   Be prepared to keep trying. Most people are not successful the first few times they try to quit. Do not get mad at yourself if you smoke again. Make a list of things you learned and think about when you want to try again, such as next week, next month, or next year.  Where can you learn more?  Go to [email or web address] and sign in to your MyChart account. Enter [phone] in the Search Health Information box to learn more about "Stopping Smoking: Care Instructions."    If you do not have an account, please click on the "Sign Up Now" link.   2006-2016 Healthwise, Incorporated. Care instructions adapted under license by [facility]. This care instruction is for use with your licensed healthcare professional. If you have questions about a medical condition or this instruction, always ask your healthcare professional. Healthwise,  Incorporated disclaims any warranty or liability for your use of this information.  Content Version: 10.9.538570; Current as of: October 20, 2013      QUIT NOW [place] SMOKING CESSATION PROGRAM    How Do I Get Started  Quitting tobacco is a process. The first step is the hardest. When you are ready to quit, Quit Now [place] can help you with each step in the quitting process.  The Program  Quit Now [place] is a FREE online service available to [place] residents 42 years of age and over. When you become a member, you get special tools, a support team of coaches, research-based information, and a community of others trying to become tobacco free. Our expert coaches can talk to you about overcoming common barriers, such as dealing with stress, fighting cravings, coping with irritability, and controlling weight gain.  We also offer a free telephone service, so you can speak to a coach in person, if you would prefer. Call the [facility] [place] or [phone].   What if I don't qualify for the Program?  You must be 53 years of age or older to participate in the program. It is also helpful to discuss quitting with your doctor.   How do I enroll in the Quit Now [place] online program?  Complete the brief Enroll Now form. If you  are a [place] resident over 21 years of age, you will receive an email letting you know that you are enrolled. You can use the online service as often as you want!   How do I enroll in both the online and telephone program?  Complete the brief Enroll Now form. If you qualify, you will receive an email letting you know that you are enrolled. You can use the online service as often as you want!  While completing the Enroll Now form you indicate the best time for a coach to give you a call. If you would like, you can call the QuitLine at 1-800-QUIT-NOW.  We're here 7 days a week.   Monday - Sunday 7 a.m. - 12 a.m. CST  Monday - Sunday 8 a.m. - 1 a.m. EST  If you call when we are closed,  please leave a message and we will call you back.     Aloha Gell, APRN

## 2015-01-11 ENCOUNTER — Encounter

## 2015-01-11 MED ORDER — ATORVASTATIN CALCIUM 20 MG PO TABS
20 MG | ORAL_TABLET | Freq: Every evening | ORAL | 5 refills | Status: DC
Start: 2015-01-11 — End: 2015-02-06

## 2015-01-11 MED ORDER — METOPROLOL TARTRATE 25 MG PO TABS
25 MG | ORAL_TABLET | Freq: Two times a day (BID) | ORAL | 5 refills | Status: DC
Start: 2015-01-11 — End: 2015-02-06

## 2015-01-11 MED ORDER — CLOPIDOGREL BISULFATE 75 MG PO TABS
75 MG | ORAL_TABLET | Freq: Every day | ORAL | 5 refills | Status: DC
Start: 2015-01-11 — End: 2015-02-06

## 2015-01-11 MED ORDER — LISINOPRIL 2.5 MG PO TABS
2.5 MG | ORAL_TABLET | Freq: Two times a day (BID) | ORAL | 5 refills | Status: DC
Start: 2015-01-11 — End: 2015-02-06

## 2015-02-06 ENCOUNTER — Encounter

## 2015-02-06 MED ORDER — CLOPIDOGREL BISULFATE 75 MG PO TABS
75 MG | ORAL_TABLET | Freq: Every day | ORAL | 5 refills | Status: DC
Start: 2015-02-06 — End: 2015-08-22

## 2015-02-06 MED ORDER — ATORVASTATIN CALCIUM 20 MG PO TABS
20 MG | ORAL_TABLET | Freq: Every evening | ORAL | 5 refills | Status: DC
Start: 2015-02-06 — End: 2015-08-22

## 2015-02-06 MED ORDER — METOPROLOL TARTRATE 25 MG PO TABS
25 MG | ORAL_TABLET | Freq: Two times a day (BID) | ORAL | 5 refills | Status: DC
Start: 2015-02-06 — End: 2015-08-22

## 2015-02-06 MED ORDER — LISINOPRIL 2.5 MG PO TABS
2.5 MG | ORAL_TABLET | Freq: Two times a day (BID) | ORAL | 5 refills | Status: DC
Start: 2015-02-06 — End: 2015-08-22

## 2015-05-22 ENCOUNTER — Ambulatory Visit
Admit: 2015-05-22 | Discharge: 2015-05-22 | Payer: BLUE CROSS/BLUE SHIELD | Attending: Cardiovascular Disease | Primary: Family Medicine

## 2015-05-22 DIAGNOSIS — I2510 Atherosclerotic heart disease of native coronary artery without angina pectoris: Secondary | ICD-10-CM

## 2015-05-22 NOTE — Progress Notes (Signed)
[facility]  [facility] [address], [address], [place]  [address]  The following was transcribed by Suella Broad, M.T.     Gregory Escobar DOB: 10/11/1970, 45 y.o. Male        Office Visit:  05/22/2015    Chief Complaint   Patient presents with   . 1 Year Follow Up     1 year follow up  No cardiac symptoms   . Coronary Artery Disease      HISTORY OF PRESENT ILLNESS  Mr. Gregory Escobar is seen here for coronary artery disease and hypertension.  This is a routine follow up.  Gregory Escobar presents doing okay.  He continues to work.  He is having no angina, no overt heart failure, no syncope, and no stroke-like problems.       Patient Active Problem List   Diagnosis Code   . Coronary artery disease involving native coronary artery I25.10   . CAD (coronary artery disease) I25.10   . HTN (hypertension) I10   . Hyperlipemia E78.5   . Cardiac LV ejection fraction 30-35% R94.30   . 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 CAD (coronary artery disease) 2016   . Cardiac LV ejection fraction 30-35%      MI 03/13/14 cath revealed EF 30-35 at that time   . HTN (hypertension)    . Hyperlipemia    . Kidney stones 05/2013     Past Surgical History   Procedure Laterality Date   . Lithotripsy  2015   . Ureter stent placement  2015      Family History   Problem Relation Age of Onset   . Heart Disease Father    . High Blood Pressure Father    . High Cholesterol Father      Social History   Substance Use Topics   . Smoking status: Current Every Day Smoker     Packs/day: 0.25     Years: 20.00   . Smokeless tobacco: Former Neurosurgeon     Types: Snuff     Quit date: 05/22/1990   . Alcohol use No      Comment: recovering addict      Allergies/Contraindications:  Review of patient's allergies indicates no known allergies.   Outpatient Prescriptions Marked as Taking for the 05/22/15 encounter (Office Visit) with [name], MD    Medication Sig Dispense Refill   . atorvastatin (LIPITOR) 20 MG tablet Take 1 tablet by mouth nightly 30 tablet 5   . clopidogrel (PLAVIX) 75 MG tablet Take 1 tablet by mouth daily 30 tablet 5   . lisinopril (PRINIVIL;ZESTRIL) 2.5 MG tablet Take 1 tablet by mouth 2 times daily 60 tablet 5   . metoprolol tartrate (LOPRESSOR) 25 MG tablet Take 1 tablet by mouth 2 times daily 60 tablet 5   . Multiple Vitamins-Minerals (THERAPEUTIC MULTIVITAMIN-MINERALS) tablet Take 1 tablet by mouth daily       Data:  BP Readings from Last 3 Encounters:   05/22/15 118/62   11/27/14 124/76   05/23/14 108/62    Pulse Readings from Last 3 Encounters:   05/22/15 68   11/27/14 78   05/23/14 78        REVIEW OF SYSTEMS  Constitutional:  Negative for diaphoresis, fever, appetite change or unexpected weight change.    HENT:  Negative  for nosebleeds, facial swelling, rhinorrhea and neck stiffness.  RESPIRATORY:  No shortness of breath, cough or sputum production.  No wheezing or stridor.   CARDIOVASCULAR:  There is no angina, no overt heart failure, and no syncope.  GASTROINTESTINAL:   Negative for abdominal distention.  GENITOURINARY:  Negative for dysuria, urgency and frequency.  MUSCULOSKELETAL:   Negative for myalgia, arthralgia and gait problem.  SKIN:  Negative for color change, pallor, rash and wound.  NEUROLOGICAL:   Negative for dizziness, syncope and lightheadedness.    HEMATOLOGICAL:   Negative for bruising and bleeding easily.  PSYCHIATRIC/BEHAVIORAL:   No excessive anxiety or confusion.   Except as noted in the HPI, all other systems are negative.        PHYSICAL EXAMINATION  GENERAL:  Alert and oriented x3 in no apparent distress.  Short-term and long-term memory intact.  Judgment intact.  Oriented to time, place and person.  No depression, anxiety or agitation.  Vital Signs:  BP 118/62  Pulse 68  Ht 5\' 7"  (1.702 m)  Wt 156 lb (70.8 kg)  BMI 24.43 kg/m2   HEAD:  Normocephalic without evidence of old or recent trauma.  EYES:   Sclerae clear.  Conjunctivae pink. Pupils equal and round.  NOSE:  Negative nasal discharge or epistaxis.  THROAT:  No lesions on lips or buccal mucosa.    NECK:  Supple without mass or JVD.  Carotid pulses 2+ to palpation bilaterally without bruit.  No thyromegaly noted.    CHEST:  Equal bilateral expansion.  RESPIRATORY:  The lungs are clear to auscultation.  Normal respiratory effort.    CARDIOVASCULAR:  The heart demonstrated a regular rhythm and rate with no cardiac murmurs, gallops or rubs noted to auscultation.      ABDOMEN:  Soft, nontender.  Exhibits no distension.  Bowel sounds are normal.   UPPER EXTREMITY EVALUATION:  Radial pulses palpable bilaterally.  No cyanosis, clubbing or edema.   LOWER EXTREMITY EVALUATION:  Femoral, popliteal, dorsalis pedis, and posterior tibialis pulses 2+ to palpation bilaterally.  No cyanosis, clubbing, or peripheral edema.    SKIN:  Warm and dry.     MUSCULOSKELETAL:  Normal muscle strength and tone.   SKIN:  Warm, dry.  NEUROLOGIC:  Cranial nerves II through XII are grossly intact.      IMPRESSION / PLAN  1.  Coronary artery disease without evidence of angina.  2.  Hypertension which appears to be adequately controlled.    3.  Continue current medications as prescribed.    4.  We will see him back in six months.        Judith Blonder. Chase Picket, M.D., Ph.D., F.A.C.C.  [facility]     cc (pcp): [name], M.D.

## 2015-08-22 ENCOUNTER — Encounter

## 2015-08-22 MED ORDER — CLOPIDOGREL BISULFATE 75 MG PO TABS
75 MG | ORAL_TABLET | Freq: Every day | ORAL | 5 refills | Status: DC
Start: 2015-08-22 — End: 2015-08-23

## 2015-08-22 MED ORDER — METOPROLOL TARTRATE 25 MG PO TABS
25 MG | ORAL_TABLET | Freq: Two times a day (BID) | ORAL | 5 refills | Status: DC
Start: 2015-08-22 — End: 2015-08-23

## 2015-08-22 MED ORDER — LISINOPRIL 2.5 MG PO TABS
2.5 MG | ORAL_TABLET | Freq: Two times a day (BID) | ORAL | 5 refills | Status: DC
Start: 2015-08-22 — End: 2015-08-23

## 2015-08-22 MED ORDER — ATORVASTATIN CALCIUM 20 MG PO TABS
20 MG | ORAL_TABLET | Freq: Every evening | ORAL | 5 refills | Status: DC
Start: 2015-08-22 — End: 2015-08-23

## 2015-08-23 ENCOUNTER — Encounter

## 2015-08-23 MED ORDER — ATORVASTATIN CALCIUM 20 MG PO TABS
20 MG | ORAL_TABLET | Freq: Every evening | ORAL | 5 refills | Status: DC
Start: 2015-08-23 — End: 2016-02-27

## 2015-08-23 MED ORDER — METOPROLOL TARTRATE 25 MG PO TABS
25 MG | ORAL_TABLET | Freq: Two times a day (BID) | ORAL | 5 refills | Status: DC
Start: 2015-08-23 — End: 2016-02-27

## 2015-08-23 MED ORDER — CLOPIDOGREL BISULFATE 75 MG PO TABS
75 MG | ORAL_TABLET | Freq: Every day | ORAL | 5 refills | Status: DC
Start: 2015-08-23 — End: 2016-02-27

## 2015-08-23 MED ORDER — LISINOPRIL 2.5 MG PO TABS
2.5 MG | ORAL_TABLET | Freq: Two times a day (BID) | ORAL | 5 refills | Status: DC
Start: 2015-08-23 — End: 2016-02-27

## 2015-11-27 ENCOUNTER — Ambulatory Visit: Admit: 2015-11-27 | Discharge: 2015-11-27 | Payer: BLUE CROSS/BLUE SHIELD | Attending: Family | Primary: Family Medicine

## 2015-11-27 DIAGNOSIS — I2510 Atherosclerotic heart disease of native coronary artery without angina pectoris: Secondary | ICD-10-CM

## 2015-11-27 MED ORDER — NITROGLYCERIN 0.4 MG SL SUBL
0.4 MG | ORAL_TABLET | SUBLINGUAL | 3 refills | Status: DC | PRN
Start: 2015-11-27 — End: 2017-07-01

## 2015-11-27 NOTE — Patient Instructions (Addendum)
 Continue current medications as prescribed.   Continue to follow up with primary care provider for non cardiac medical problems.  Call the office with any problems, questions or concerns at [phone].  Follow up as scheduled with your cardiologist - 6 months Dr. Doylene Canard.  The following educational material has been included in this after visit summary for your review: heart health, heart disease, smoking cessation.    Additional instructions:  You have been given orders for a cholesterol check.  You will need to fast after midnight.  You may have black coffee, diet soda or water before the test.  You may take your medications before the test.  Coronary artery disease risk factors you can control: Smoking, high blood pressure, high cholesterol, diabetes, being overweight, lack of exercise and stress.  Continue heart healthy diet.  Take medications as directed.  Exercise as tolerated. Strive for 15 minutes of exercise most days of the week.   If asked to keep a blood pressure log, do so for 2 weeks. Call the office to report readings at [phone].  Blood pressure goal is 140/90 or less. If you are a diabetic, the goal is 130/80 or less.  If you are taking cholesterol lowering medications, it is recommended that lab work be checked annually.  Always keep a current medication list. Bring your medications to every office visit.     How to take:  NITROGLYCERIN (Nitrostat) 0.4 mg tablets, sublingual.  Nitroglycerin is in a group of drugs called nitrates. Nitroglycerin dilates (widens) blood vessels, making it easier for blood to flow through them and easier for the heart to pump.  Dosing Guidelines for Nitroglycerin Tablets   At the start of an angina (chest pain) attack, place one tablet under the tongue or between the cheek and gum.  Do not swallow or chew the tablet; let it dissolve on its own.  If necessary, a second and third tablet may be used, with five minutes between using each tablet.  If you use a third  tablet and your chest pain continues, it is time to seek immediate medical attention.  Call 911 immediately and have someone drive you to the emergency room.  You may be having a heart attack or other serious heart problem.   To prevent angina from exercise or stress, use 1 tablet 5 to 10 minutes before the activity.    Learning About Coronary Artery Disease (CAD)  What is coronary artery disease?    Coronary artery disease (CAD) occurs when plaque builds up in the arteries that bring oxygen-rich blood to your heart. Plaque is a fatty substance made of cholesterol, calcium, and other substances in the blood. This process is called hardening of the arteries, or atherosclerosis.  What happens when you have coronary artery disease?   Plaque may narrow the coronary arteries. Narrowed arteries cause poor blood flow. This can lead to angina symptoms such as chest pain or discomfort. If blood flow is completely blocked, you could have a heart attack.   You can slow CAD and reduce the risk of future problems by making changes in your lifestyle. These include quitting smoking and eating heart-healthy foods.   Treatments for CAD, along with changes in your lifestyle, can help you live a longer and healthier life.  How can you prevent coronary artery disease?   Do not smoke. It may be the best thing you can do to prevent heart disease. If you need help quitting, talk to your doctor about stop-smoking programs and  medicines. These can increase your chances of quitting for good.   Be active. Get at least 30 minutes of exercise on most days of the week. Walking is a good choice. You also may want to do other activities, such as running, swimming, cycling, or playing tennis or team sports.   Eat heart-healthy foods. Eat more fruits and vegetables and less foods that contain saturated and trans fats. Limit alcohol, sodium, and sweets.   Stay at a healthy weight. Lose weight if you need to.   Manage other health problems  such as diabetes, high blood pressure, and high cholesterol.   Manage stress. Stress can hurt your heart. To keep stress low, talk about your problems and feelings. Don't keep your feelings hidden.   If you have talked about it with your doctor, take a low-dose aspirin every day. Aspirin can help certain people lower their risk of a heart attack or stroke. But taking aspirin isn't right for everyone, because it can cause serious bleeding. Do not start taking daily aspirin unless your doctor knows about it.  How is coronary artery disease treated?   Your doctor will suggest that you make lifestyle changes. For example, your doctor may ask you to eat healthy foods, quit smoking, lose extra weight, and be more active.   You will have to take medicines.   Your doctor may suggest a procedure to open narrowed or blocked arteries. This is called angioplasty. Or your doctor may suggest using healthy blood vessels to create detours around narrowed or blocked arteries. This is called bypass surgery.  Follow-up care is a key part of your treatment and safety. Be sure to make and go to all appointments, and call your doctor if you are having problems. It's also a good idea to know your test results and keep a list of the medicines you take.  Where can you learn more?  Go to [email or web address] and sign in to your MyChart account. Enter C643 in the Search Health Information box to learn more about "Learning About Coronary Artery Disease (CAD)."     If you do not have an account, please click on the "Sign Up Now" link.  Current as of: February 27, 2015  Content Version: 11.3   2006-2017 [employer]. Care instructions adapted under license by [facility]. If you have questions about a medical condition or this instruction, always ask your healthcare professional. [employer], Incorporated disclaims any warranty or liability for your use of this information.     A Healthy Heart: Care  Instructions  Your Care Instructions    Heart disease occurs when a substance called plaque builds up in the vessels that supply oxygen-rich blood to your heart. This can narrow the blood vessels and reduce blood flow. A heart attack happens when blood flow is completely blocked. A high-fat diet, smoking, and other factors increase the risk of heart disease.  Your doctor has found that you have a chance of having heart disease. You can do lots of things to keep your heart healthy. It may not be easy, but you can change your diet, exercise more, and quit smoking. These steps really work to lower your chance of heart disease.  Follow-up care is a key part of your treatment and safety. Be sure to make and go to all appointments, and call your doctor if you are having problems. It's also a good idea to know your test results and keep a list of the medicines you take.  How can  you care for yourself at home?  Diet   Use less salt when you cook and eat. This helps lower your blood pressure. Taste food before salting. Add only a little salt when you think you need it. With time, your taste buds will adjust to less salt.   Eat fewer snack items, fast foods, canned soups, and other high-salt, high-fat, processed foods.   Read food labels and try to avoid saturated and trans fats. They increase your risk of heart disease by raising cholesterol levels.   Limit the amount of solid fat-butter, margarine, and shortening-you eat. Use olive, peanut, or canola oil when you cook. Bake, broil, and steam foods instead of frying them.   Eating fish can lower your risk for heart disease. Eat at least 2 servings of fish a week. Salmon, mackerel, herring, sardines, and chunk light tuna are very good choices. These fish contain omega-3 fatty acids.   Eat a variety of fruit and vegetables every day. Dark green, deep orange, red, or yellow fruits and vegetables are especially good for you. Examples include spinach, carrots, peaches, and  berries.   Foods high in fiber can reduce your cholesterol and provide important vitamins and minerals. High-fiber foods include whole-grain cereals and breads, oatmeal, beans, brown rice, citrus fruits, and apples.   Limit drinks and foods with added sugar. These include candy, desserts, and soda pop.  Lifestyle changes   If your doctor recommends it, get more exercise. Walking is a good choice. Bit by bit, increase the amount you walk every day. Try for at least 30 minutes on most days of the week. You also may want to swim, bike, or do other activities.   Do not smoke. If you need help quitting, talk to your doctor about stop-smoking programs and medicines. These can increase your chances of quitting for good. Quitting smoking may be the most important step you can take to protect your heart. It is never too late to quit. You will get health benefits right away.   Limit alcohol to 2 drinks a day for men and 1 drink a day for women. Too much alcohol can cause health problems.  Medicines   Take your medicines exactly as prescribed. Call your doctor if you think you are having a problem with your medicine.   If your doctor recommends aspirin, take the amount directed each day. Make sure you take aspirin and not another kind of pain reliever, such as acetaminophen (Tylenol). If you take ibuprofen (such as Advil or Motrin) for other problems, take aspirin at least 2 hours before taking ibuprofen.  When should you call for help?  Call 911 if you have symptoms of a heart attack. These may include:   You have chest pain or pressure. This may occur with:   Chest pain or pressure, or a strange feeling in the chest.   Sweating.   Shortness of breath.   Pain, pressure, or a strange feeling in the back, neck, jaw, or upper belly or in one or both shoulders or arms.   Lightheadedness or sudden weakness.   A fast or irregular heartbeat.  After you call 911, the operator may tell you to chew 1 adult-strength or 2  to 4 low-dose aspirin. Wait for an ambulance. Do not try to drive yourself.  Watch closely for changes in your health, and be sure to contact your doctor if:   Your symptoms are slowly getting worse.   You do not get better as expected.  Where can you learn more?  Go to [email or web address] and sign in to your MyChart account. Enter [phone] in the Search Health Information box to learn more about "A Healthy Heart: Care Instructions."     If you do not have an account, please click on the "Sign Up Now" link.  Current as of: June 03, 2015  Content Version: 11.3   2006-2017 [employer], Incorporated. Care instructions adapted under license by [facility]. If you have questions about a medical condition or this instruction, always ask your healthcare professional. Healthwise, Incorporated disclaims any warranty or liability for your use of this information.    QUIT NOW [place] SMOKING CESSATION PROGRAM    How Do I Get Started  Quitting tobacco is a process. The first step is the hardest. When you are ready to quit, Quit Now [place] can help you with each step in the quitting process.  The Program  Quit Now [place] is a FREE online service available to [place] residents 43 years of age and over. When you become a member, you get special tools, a support team of coaches, research-based information, and a community of others trying to become tobacco free. Our expert coaches can talk to you about overcoming common barriers, such as dealing with stress, fighting cravings, coping with irritability, and controlling weight gain.  We also offer a free telephone service, so you can speak to a coach in person, if you would prefer. Call the [place] at [place] or [phone].   What if I don't qualify for the Program?  You must be 19 years of age or older to participate in the program. It is also helpful to discuss quitting with your doctor.   How do I enroll in the Quit Now [place] online  program?  Complete the brief Enroll Now form. If you are a [place] resident over 49 years of age, you will receive an email letting you know that you are enrolled. You can use the online service as often as you want!   How do I enroll in both the online and telephone program?  Complete the brief Enroll Now form. If you qualify, you will receive an email letting you know that you are enrolled. You can use the online service as often as you want!  While completing the Enroll Now form you indicate the best time for a coach to give you a call. If you would like, you can call the QuitLine at 1-800-QUIT-NOW.  We're here 7 days a week.   Monday - Sunday 7 a.m. - 12 a.m. CST  Monday - Sunday 8 a.m. - 1 a.m. EST  If you call when we are closed, please leave a message and we will call you back.          Tips to Help You Stop Smoking   Cigarette smoking is a preventable cause of death in the [place].  If you have thought about quitting but haven't been able to, here are some reasons why you should and some ways to do it.       Here's Why   Quitting smoking now can decrease your risk of getting smoking-related illnesses like:   Heart disease   Stroke   Several types of cancer, including:   Lung, Mouth, Esophagus, Larynx, Bladder, Pancreas, Kidney   Chronic lung diseases:   Bronchitis, Emphysema, Asthma   Cataracts   Macular degeneration   Thyroid conditions   Hearing loss   Erectile dysfunction   Dementia  Osteoporosis     Here's How   Once you've decided to quit smoking, set your target quit date a few weeks away. In the time leading up to your quit day, try some of these ideas offered by the Tobacco Control Research Branch of the National Cancer Institute to help you successfully quit smoking.   For the best results, work with your doctor. Together, you can test your lung function and compare the results to those of a nonsmoking person. The results can be given to you as your lung age. Finding out your lung age right  after having the test done may help you to stop smoking.     Your doctor can also discuss with you all of your options and refer you to smoking-cessation support groups. You may wish to use nicotine replacement (gum, patches, inhaler) or one of the prescription medications that have been shown to increase quit rates and prolong abstinence from smoking. But whatever you and your doctor decide on these matters, it will still be you who decides when an how to quit. Here are some techniques:     Switch Brands   Switch to a brand you find distasteful.   Change to a brand that is low in tar and nicotine a couple of weeks before your target quit date. This will help change your smoking behavior. However, do not smoke more cigarettes, inhale them more often or more deeply, or place your fingertips over the holes in the filters. All of these actions will increase your nicotine intake, and the idea is to get your body used to functioning without nicotine.     Cut Down the Number of Cigarettes You Smoke   Smoke only half of each cigarette.   Each day, postpone the lighting of your first cigarette by one hour.   Decide you'll only smoke during odd or even hours of the day.   Decide beforehand how many cigarettes you'll smoke during the day. For each additional cigarette, give a dollar to your favorite charity.   Change your eating habits to help you cut down. For example, drink milk, which many people consider incompatible with smoking. End meals or snacks with something that won't lead to a cigarette.   Reach for a glass of juice instead of a cigarette for a "pick-me-up."   Remember: Cutting down can help you quit, but it's not a substitute for quitting. If you're down to about seven cigarettes a day, it's time to set your target quit date, and get ready to stick to it.     Don't Smoke "Automatically"   Smoke only those cigarettes you really want. Catch yourself before you light up a cigarette out of pure habit.   Don't empty  your ashtrays. This will remind you of how many cigarettes you've smoked each day, and the sight and the smell of stale cigarettes butts will be very unpleasant.   Make yourself aware of each cigarette by using the opposite hand or putting cigarettes in an unfamiliar location or a different pocket to break the automatic reach.   If you light up many times during the day without even thinking about it, try to look in a mirror each time you put a match to your cigarette. You may decide you don't need it.     Make Smoking Inconvenient   Stop buying cigarettes by the carton. Wait until one pack is empty before you buy another.   Stop carrying cigarettes with you at home  or at work. Make them difficult to get to.     Make Smoking Unpleasant   Smoke only under circumstances that aren't especially pleasurable for you. If you like to smoke with others, smoke alone. Turn your chair to an empty corner and focus only on the cigarette you are smoking and all its many negative effects.   Collect all your cigarette butts in one large glass container as a visual reminder of the filth made by smoking.     Just Before Quitting   Practice going without cigarettes.   Don't think of never smoking again. Think of quitting in terms of one day at a time .   Tell yourself you won't smoke today, and then don't.   Clean your clothes to rid them of the cigarette smell, which can linger a long time.     On the Day You Quit   Throw away all your cigarettes and matches. Hide Engineer, mining.   Visit the dentist and have your teeth cleaned to get rid of tobacco stains. Notice how nice they look and resolve to keep them that way.   Make a list of things you'd like to buy for yourself or someone else. Estimate the cost in terms of packs of cigarettes, and put the money aside to buy these presents.   Keep very busy on the big day. Go to the movies, exercise, take long walks, or go bike riding.   Remind your family and friends that this is  your quit date, and ask them to help you over the rough spots of the first couple of days and weeks.   Buy yourself a treat or do something special to celebrate.     Telephone and Internet Support   Call the [place] Tobacco Quit [employer].  Telephone, web-, and computer-based programs can offer you the support that you need to quit and to stay smoke-free. You can find many programs online.    Immediately After Quitting   Develop a clean, fresh, nonsmoking environment around yourself at work and at home. Buy yourself flowers you may be surprised how much you can enjoy their scent now.   The first few days after you quit, spend as much free time as possible in places where smoking isn't allowed, such as libraries, museums, theaters, department stores, and churches.   Drink large quantities of water and fruit juice (but avoid sodas that contain caffeine).   Try to avoid alcohol, coffee, and other beverages that you associate with cigarette smoking.   Strike up conversation instead of a Microbiologist for a cigarette.   If you miss the sensation of having a cigarette in your hand, play with something else a pencil, a paper clip, a marble.   If you miss having something in your mouth, try toothpicks or a fake cigarette.          Deciding About Using Medicines To Quit Smoking  What are the medicines you can use?  Your doctor may prescribe varenicline (Chantix) or bupropion (Zyban). These medicines can help you cope with cravings for tobacco. They are pills that don't contain nicotine.  You also can use nicotine replacement products. These do contain nicotine. There are many types.   Gum and lozenges slowly release nicotine into your mouth.   Patches stick to your skin. They slowly release nicotine into your bloodstream.   An inhaler has a holder that contains nicotine. You breathe in a puff of nicotine vapor through your  mouth and throat.   Nasal spray releases a mist that contains nicotine.  What are key points about  this decision?   Using medicines can double your chances of quitting smoking. They can ease cravings and withdrawal symptoms.   Getting counseling along with using medicine can raise your chances of quitting even more.   If you smoke fewer than 5 cigarettes a day, you may not need medicines to help you quit smoking.   These medicines have less nicotine than cigarettes. And by itself, nicotine is not nearly as harmful as smoking. The tars, carbon monoxide, and other toxic chemicals in tobacco cause the harmful effects.   The side effects of nicotine replacement products depend on the type of product. For example, a patch can make your skin red and itchy. Medicines in pill form can make you sick to your stomach. They can also cause dry mouth and trouble sleeping. For most people, the side effects are not bad enough to make them stop using the products.   FDA warning. The U.S. Food and Drug Administration (FDA) warns that people who are taking bupropion or varenicline and who have any serious or unusual changes in mood or behavior or who feel like hurting themselves or someone else should stop taking the medicine and call a doctor right away. If you already have a mood or behavior problem, be sure to tell your doctor before you decide to use these medicines.  Why might you choose to use medicines to quit smoking?   You have tried on your own to stop smoking, but you were not able to stop.   You smoke more than 5 cigarettes a day.   You want to increase your chances of quitting smoking.   You want to reduce your cravings and withdrawal symptoms.   You feel the benefits of medicine outweigh the side effects.  Why might you choose not to use medicine?   You want to try quitting on your own by stopping all at once ("cold [place]").   You want to cut back slowly on the number of cigarettes you smoke.   You smoke fewer than 5 cigarettes a day.   You do not like using medicine.   You feel the side effects of  medicines outweigh the benefits.   You are worried about the cost of medicines.  Your decision  Thinking about the facts and your feelings can help you make a decision that is right for you. Be sure you understand the benefits and risks of your options, and think about what else you need to do before you make the decision.  Where can you learn more?  Go to [email or web address] and sign in to your MyChart account. Enter [phone] in the Search Health Information box to learn more about "Deciding About Using Medicines To Quit Smoking."     If you do not have an account, please click on the "Sign Up Now" link.  Current as of: May 20, 2015  Content Version: 11.3   2006-2017 Healthwise, Incorporated. Care instructions adapted under license by [facility]. If you have questions about a medical condition or this instruction, always ask your healthcare professional. Healthwise, Incorporated disclaims any warranty or liability for your use of this information.         bupropion  Pronunciation:  byoo PRO pee on  Brand:  Aplenzin, Buproban, Forfivo XL, Wellbutrin SR, Wellbutrin XL, Zyban, Zyban Advantage Pack  What is the most important information I should know about  bupropion?  You should not take bupropion if you have seizures, an eating disorder, or if you have suddenly stopped using alcohol, seizure medication, or sedatives. If you take Wellbutrin for depression, do not also take Zyban to quit smoking.   Do not use this medicine if you have used an MAO inhibitor in the past 14 days, such as isocarboxazid, linezolid, methylene blue injection, phenelzine, rasagiline, selegiline, or tranylcypromine.  Some young people have thoughts about suicide when first taking an antidepressant. Your doctor will need to check your progress at regular visits.  Report any new or worsening symptoms to your doctor, such as unusual mood or behavior changes.  What is bupropion?  Bupropion is an antidepressant medication used  to treat major depressive disorder and seasonal affective disorder. The Zyban brand of bupropion is used to help people stop smoking by reducing cravings and other withdrawal effects.  Bupropion may also be used for purposes not listed in this medication guide.  What should I discuss with my healthcare provider before taking bupropion?  You should not take bupropion if you are allergic to it, or if you have:   epilepsy or a seizure disorder;   an eating disorder such as anorexia or bulimia; or   if you have suddenly stopped using alcohol, seizure medication, or a sedative (Valium, Nembutal, Seconal, Solfoton, and others).  Do not take bupropion if you have used an MAO inhibitor in the past 14 days. A dangerous drug interaction could occur. MAO inhibitors include isocarboxazid, linezolid, methylene blue injection, phenelzine, rasagiline, selegiline, tranylcypromine, and others.  Do not take bupropion to treat more than one condition at a time. If you take Wellbutrin for depression, do not also take Zyban to quit smoking.   Bupropion may cause seizures, especially in people who have certain medical conditions or use certain drugs. Tell your doctor about all of your medical conditions and the drugs you use.  To make sure bupropion is safe for you, tell your doctor if you have:   a history of head injury, seizures, or brain or spinal cord tumor;   narrow-angle glaucoma;   heart disease, high blood pressure, history of heart attack;   diabetes;   kidney or liver disease (especially cirrhosis);   bipolar disorder (manic depression); or   if you drink alcohol.  Some young people have thoughts about suicide when first taking an antidepressant. Your doctor will need to check your progress at regular visits. Your family or other caregivers should also be alert to changes in your mood or symptoms.  It is not known whether this medicine will harm an unborn baby. Tell your doctor if you are pregnant or plan to become  pregnant.  Bupropion can pass into breast milk and may harm a nursing baby. You should not breast-feed while using this medicine.  Bupropion is not approved for use by anyone younger than 43 years old.  How should I take bupropion?  Follow all directions on your prescription label. Do not take this medicine in larger or smaller amounts or for longer than recommended. Too much of this medicine can increase your risk of a seizure.  You may take bupropion with or without food.  Do not crush, chew, or break an extended-release tablet. Swallow it whole.  You should not change your dose or stop using bupropion suddenly, unless you have a seizure while taking this medicine. Stopping suddenly can cause unpleasant withdrawal symptoms. Ask your doctor how to safely stop using bupropion.  If  you take Zyban to help you stop smoking, you may continue to smoke for about 1 week after you start the medicine. Set a date to quit smoking during the second week of treatment. Talk to your doctor if you have trouble quitting after taking Zyban for 7 weeks.  Your doctor may prescribe nicotine patches or gum to help you stop smoking. Do not smoke at any time if you are using a nicotine product along with Zyban. Too much nicotine can cause serious side effects.  Read all patient information, medication guides, and instruction sheets provided to you. Ask your doctor or pharmacist if you have any questions.  You may have nicotine withdrawal symptoms when you stop smoking, including: increased appetite, weight gain, trouble sleeping, trouble concentrating, slower heart rate, having the urge to smoke, and feeling anxious, restless, depressed, angry, frustrated, or irritated. These symptoms may occur with or without using medication such as Zyban.   Smoking cessation may also cause new or worsening mental health problems, such as depression.  Bupropion can cause you to have a false positive drug screening test. If you provide a urine sample for  drug screening, tell the laboratory staff that you are taking bupropion.  Store at room temperature away from moisture, heat, and light.  What happens if I miss a dose?  Take the missed dose as soon as you remember. Skip the missed dose if it is almost time for your next scheduled dose. Do not  take extra medicine to make up the missed dose.  What happens if I overdose?  Seek emergency medical attention or call the Poison Help line at [phone]. An overdose of bupropion can be fatal.  Overdose symptoms may include muscle stiffness, hallucinations, fast or uneven heartbeat, shallow breathing, or fainting.  What should I avoid while taking bupropion?  Drinking alcohol with bupropion may increase your risk of seizures. If you drink alcohol regularly, talk with your doctor before changing the amount you drink. Bupropion can also cause seizures in a regular drinker who suddenly stops drinking at the start of treatment with bupropion.  Bupropion may impair your thinking or reactions. Be careful if you drive or do anything that requires you to be alert.  What are the possible side effects of bupropion?  Get emergency medical help if you have signs of an allergic reaction: hives, rash or itching; fever, swollen glands, joint pain, general ill feeling; difficult breathing; swelling of your face, lips, tongue, or throat.  You may have changes in thinking or behavior while taking Zyban to help you quit smoking. If you have any of the following symptoms, stop taking Zyban and call your doctor right away:   mood or behavior changes, unusual thoughts or feelings;   feeling anxious, restless, or depressed;   agitation, hostility, aggression;   impulsive or risk-taking behavior;   confusion, paranoia, hallucinations, panic; or   thoughts about suicide or hurting yourself.  Report any new or worsening symptoms to your doctor.  Call your doctor at once if you have:   a seizure (convulsions);   a manic episode --racing  thoughts, increased energy, reckless behavior, feeling extremely happy or irritable, talking more than usual, severe problems with sleep;   blurred vision, tunnel vision, eye pain or swelling, or seeing halos around lights;   fast heartbeats;   severe skin reaction --fever, sore throat, swelling in your face or tongue, burning in your eyes, skin pain, followed by a red or purple skin rash that spreads (especially  in the face or upper body) and causes blistering and peeling.  Common side effects may include:   dry mouth, stuffy nose;   nausea, vomiting, constipation;   feeling anxious, nervous, or shaky;   headache, dizziness;   sleep problems (insomnia);   heavy sweating; or   joint pain.  This is not a complete list of side effects and others may occur. Call your doctor for medical advice about side effects. You may report side effects to FDA at 1-800-FDA-1088.  What other drugs will affect bupropion?  You may have a higher risk of seizures if you use certain other medicines while taking bupropion.   Many drugs can interact with bupropion.  Tell your doctor about all medicines you use, and those you start or stop using during your treatment with bupropion. This includes prescription and over-the-counter medicines, vitamins, and herbal products. Not all possible interactions are listed in this medication guide.   Where can I get more information?  Your pharmacist can provide more information about bupropion.    Remember, keep this and all other medicines out of the reach of children, never share your medicines with others, and use this medication only for the indication prescribed.  Every effort has been made to ensure that the information provided by [employer]') is accurate, up-to-date, and complete, but no guarantee is made to that effect. Drug information contained herein may be time sensitive. [employer] information has been compiled for use by healthcare practitioners and consumers in  the [place] and therefore [employer] does not warrant that uses outside of the [place] are appropriate, unless specifically indicated otherwise. [employer]'s drug information does not endorse drugs, diagnose patients or recommend therapy. [employer]'s drug information is an [employer] corporate to assist licensed healthcare practitioners in caring for their patients and/or to serve consumers viewing this service as a supplement to, and not a substitute for, the expertise, skill, knowledge and judgment of healthcare practitioners. The absence of a warning for a given drug or drug combination in no way should be construed to indicate that the drug or drug combination is safe, effective or appropriate for any given patient. [employer] does not assume any responsibility for any aspect of healthcare administered with the aid of information [employer] provides. The information contained herein is not intended to cover all possible uses, directions, precautions, warnings, drug interactions, allergic reactions, or adverse effects. If you have questions about the drugs you are taking, check with your doctor, nurse or pharmacist.  Copyright [id number] [employer] Version: 19.01. Revision date: 02/27/2015.  Care instructions adapted under license by [facility]. If you have questions about a medical condition or this instruction, always ask your healthcare professional. [employer] disclaims any warranty or liability for your use of this information.

## 2015-11-27 NOTE — Progress Notes (Signed)
[facility], [place].  [facility]  [address], [address], [place]  [address]  [phone] office  [phone] fax      OFFICE VISIT:  11/27/2015    Gregory Escobar - DOB: 1970-11-16    Reason For Visit:  Gregory Escobar is a 45 y.o. male who is here for Follow-up (Patient presents for cardiology follow up today doing well. ); Coronary Artery Disease; Hypertension; Hyperlipidemia; and Cardiomyopathy    The patient presents today for cardiology follow up.   Overall, the patient is doing well from a cardiac standpoint without symptoms to suggest myocardial ischemia.  BP is well controlled on current regimen.  The patient is due for lipid and liver panel. He is tolerating Lipitor well.  The patient's job is physically strenuous and is able to perform without angina or SOA.  He continues to smoke without a plan for cessation.  He experesses fears regarding Chantix due to side effects. The patient's PCP monitors cholesterol.  The patient was admitted emergently to [facility] on 03/13/14 with acute extensive anterior wall MI.  He underwent direct infarct angioplasty with DES to LAD.  EF was 30-35% at that time. Follow up 2 D echo in March showed EF at 35-40%.    Subjective  Gregory Escobar denies exertional chest pain, shortness of breath, orthopnea, paroxysmal nocturnal dyspnea, syncope, presyncope, sustained arrythmia, edema and fatigue.  The patient denies numbness or weakness to suggest cerebrovascular accident or transient ischemic attack.      Gregory Escobar has the following history as recorded in EpicCare:    Patient Active Problem List   Diagnosis Code   . Coronary artery disease involving native coronary artery I25.10   . CAD (coronary artery disease) I25.10   . HTN (hypertension) I10   . Hyperlipemia E78.5   . Cardiac LV ejection fraction 30-35% R94.30   . Acute MI, anterior wall (HCC) I21.09   . History of myocardial infarction I25.2   . Decreased left ventricular systolic function  I51.9   . Smoker F17.200     Past Medical History:   Diagnosis Date   . 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 CAD (coronary artery disease) 2016   . Cardiac LV ejection fraction 30-35%     MI 03/13/14 cath revealed EF 30-35 at that time   . HTN (hypertension)    . Hyperlipemia    . Kidney stones 05/2013     Past Surgical History:   Procedure Laterality Date   . LITHOTRIPSY  2015   . URETER STENT PLACEMENT  2015     Family History   Problem Relation Age of Onset   . Heart Disease Father    . High Blood Pressure Father    . High Cholesterol Father      Social History   Substance Use Topics   . Smoking status: Current Every Day Smoker     Packs/day: 0.25     Years: 20.00   . Smokeless tobacco: Former Neurosurgeon     Types: Snuff     Quit date: 05/22/1990   . 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 nitroGLYCERIN (NITROSTAT) 0.4 MG SL tablet Place 1 tablet under the tongue every 5 minutes as needed for Chest pain 25 tablet 3   . atorvastatin (LIPITOR) 20 MG tablet Take 1 tablet  by mouth nightly 30 tablet 5   . clopidogrel (PLAVIX) 75 MG tablet Take 1 tablet by mouth daily 30 tablet 5   . lisinopril (PRINIVIL;ZESTRIL) 2.5 MG tablet Take 1 tablet by mouth 2 times daily 60 tablet 5   . metoprolol tartrate (LOPRESSOR) 25 MG tablet Take 1 tablet by mouth 2 times daily 60 tablet 5   . Multiple Vitamins-Minerals (THERAPEUTIC MULTIVITAMIN-MINERALS) tablet Take 1 tablet by mouth daily       No current facility-administered medications for this visit.      Allergies: Review of patient's allergies indicates no known allergies.    Review of Systems  Constitutional - no appetite change, or unexpected weight change. No fever, chills or diaphoresis.  No significant change in activity level or new onset of fatigue.   HEENT - no significant rhinorrhea or epistaxis. No tinnitus or significant hearing loss.   Eyes - no sudden vision change or amaurosis. No corneal  arcus, xantholasma, subconjunctival hemorrhage or discharge.  Respiratory - no significant wheezing, stridor, apnea or cough.  No dyspnea on exertion or shortness of air.  Cardiovascular - no exertional chest pain to suggest myocardial ischemia.  No orthopnea or PND.  No sensation of sustained arrythmia.  No occurrence of slow heart rate.  No palpitations.  No claudication.  No leg edema.  Gastrointestinal - no abdominal swelling or pain. No blood in stool. No severe constipation, diarrhea, nausea, or vomiting.   Genitourinary - no dysuria, frequency, or urgency. No flank pain or hematuria.   Musculoskeletal - no back pain or myalgia.  No problems with gait.  Extremities - no clubbing, cyanosis or edema.  Skin - no color change or rash.  No pallor.  No new surgical incision.   Neurologic - no speech difficulty, facial asymmetry or lateralizing weakness.  No seizures, presyncope or syncope.  No significant dizziness.  Hematologic - no easy bruising or excessive bleeding.   Psychiatric - no severe anxiety or insomnia.  No confusion.   All other review of systems are negative.    Objective  Vital Signs - BP 120/70  Pulse 52  Ht 5\' 7"  (1.702 m)  Wt 155 lb (70.3 kg)  BMI 24.28 kg/m2  General - Gregory Escobar is alert, cooperative, and pleasant.  Well groomed.  No acute distress.    Body habitus - Body mass index is 24.28 kg/(m^2).  HEENT - Head is normocephalic. No circumoral cyanosis.  Dentition is normal.  EYES -   Lids normal without ptosis.  No discharge, edema or subconjunctival hemorrhage.   Neck - Symmetrical without apparent mass or lymphadenopathy.   Respiratory - Normal respiratory effort without use of accessory muscles.  Ausculatation reveals vesicular breath sounds without crackles, wheezes, rub or rhonchi.    Cardiovascular - No jugular venous distention.  Auscultation reveals regular rate and rhythm.  No audible clicks, gallop or rub.  No murmur.  No lower extremity varicosities.  No carotid bruits.     Abdominal -  No visible distention, mass or pulsations.  Extremities - No clubbing or cyanosis.  No statis dermatitis or ulcers. No edema.    Musculoskeletal -   No Osler's nodes.  No kyphosis or scoliosis.  Gait is even and regular without limp or shuffle. Ambulates without assistance.  Skin -  Warm and dry; no rash or pallor.   No new surgical wound.  Neurological - No focal neurological deficits.  Thought processes coherent.  No apparent tremor.   Oriented to person, place  and time.    Psychiatric -  Appropriate affect and mood.     Assessment:    1. Coronary artery disease involving native coronary artery, angina presence unspecified, unspecified whether native or transplanted heart  EKG 12 lead   2. Mixed hyperlipidemia  Lipid Panel    AST    ALT   3. History of myocardial infarction     4. Decreased left ventricular systolic function      05/03/14 2D echo showed EF at 35-40%   5. Smoker       EKG reviewed:  SB 52 BPM; no ectopy or acute ischemic changes.    2D echo report reviewed today with patient:  PROCEDURE DATE: 05/01/2014  QUALITY STATEMENT  This is a technically difficult study.  TWO-DIMENSIONAL AND M-MODE FINDINGS  1.  The aortic root appears to be normal in size.    2.  The aortic valve is tricuspid with grossly normal thickness and mobility.  3.  The mitral valve leaflets are of normal thickness and mobility.    4.  The tricuspid valve leaflets are of normal thickness and mobility.    5.  The left ventricle is mildly enlarged.  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    6.  The right ventricle is normal is size.    7.  The right atrium is normal in size.    8.  The left atrium is mildly enlarged.    9.  No pericardial effusion is detected.  DOPPLER/COLOR FLOW STUDIES  1.  Color flow across the mitral valve fails to reveal significant mitral  insufficiency or evidence of possible diastolic dysfunction.    2.  Color flow across the aortic  valve fails to reveal aortic stenosis or  aortic insufficiency.    3.  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      03/13/14 cardiac cath report reviewed today:  CARDIOLOGIST:  Dan Humphreys, MD  PROCEDURE DATE:  03/13/2014    PROCEDURES PERFORMED    Cardiac catheterization with angioplasty and stenting of the left anterior  descending coronary artery and placement of an intraaortic balloon pump.    INDICATION   Acute anterolateral wall myocardial infarction.    DESCRIPTION OF PROCEDURE    The right femoral region was sterilely prepped and draped.  Following  anesthesia with 2% Xylocaine, a 6-French short sheath was placed in the right  femoral artery and 5-French catheters were advanced without difficulty.    HEMODYNAMICS    1.  The AO is 120/80.    2.  The LV is 120/10 with an LVEDP of 30.    LEFT VENTRICULOGRAM    Left ventricle was viewed in the RAO position.  Optiray was injected 15 mL a  second for 2 seconds.  These views revealed apical dyskinesis with the  overall ejection fraction estimated to be 30% to 35%.      SELECTIVE CORONARY ARTERIOGRAPHY   LEFT MAIN CORONARY ARTERY:  The left main coronary artery is a large-caliber  vessel, intermediate in length, free of significant occlusive disease.    LEFT ANTERIOR DESCENDING:  The left anterior descending is a large-caliber  vessel, totally occluded in its proximal portion.    LEFT CIRCUMFLEX:  The left circumflex is a large-caliber vessel  with a 70% to  80% stenosis in an early arising small obtuse marginal branch.  The left  circumflex is a large-caliber dominant vessel with a large region of  distribution.    RIGHT CORONARY ARTERY:  The right coronary artery is an intermediate caliber,  nondominant vessel with subtotal focused stenosis of the acute  marginal  branch.    Following the above-mentioned coronary angiograms, the left main coronary  artery was engaged with a 6-French Q guide.  A Cougar _____ wire was placed  in the distal left anterior descending.  The point of total occlusion was  ballooned with a 3.5 x 12 mm TREK balloon with restoration of flow.   Subsequent to this, a 4.0 x 23 mm Xience drug-eluting stent was placed and  dilated to 12 atmospheres.  This resulted in a very satisfactory angiographic  result.  There is noted to be persistent thrombus in the distal portion of  the left anterior descending diagonal.    Following the angioplasty and stenting, an 8-French sheath was placed in the  right femoral artery with a 8-French balloon placed in the descending aorta.   The balloon pump was secured to the skin with provided locking devices.    IMPRESSION    1.  Left ventriculogram revealing apical dyskinesis with the overall ejection  fraction estimated to be 30% to 35%.    2.  Total occlusion of the left anterior descending with revascularization  employing a 4.0 x 23 mm Xience drug-eluting stent.    3.  Severe disease of an early arising obtuse marginal branch and acute  marginal branch of the nondominant right coronary artery.    Stable CV status without symptoms of decompensated heart failure, arrhythmia or angina.  Patient education provided for 3 minutes regarding importance of smoking cessation and heart disease.  Medication options discussed.  Education material provided on smoking cessation medications, tips and contact for [employer] program. Patient does not report willingness to stop smoking at this point. Orders provided for lipid and liver panel.  Patient is compliant with medication regimen.    BP Readings from Last 3 Encounters:   11/27/15 120/70   05/22/15 118/62   11/27/14 124/76    Pulse Readings from Last 3 Encounters:   11/27/15 52   05/22/15 68   11/27/14 78        Wt Readings from Last 3 Encounters:   11/27/15 155 lb  (70.3 kg)   05/22/15 156 lb (70.8 kg)   11/27/14 149 lb 12.8 oz (67.9 kg)     Plan  Previous cardiac history and records reviewed.  Lab orders:  Lipid panel, AST, ALT.  Continue current medications as prescribed.   Continue to follow up with primary care provider for non cardiac medical problems.  Call the office with any problems, questions or concerns at [phone].  Follow up as scheduled with your cardiologist - 6 months Dr. Chase Picket.  The following educational material has been included in this after visit summary for your review: heart health, heart disease, smoking cessation.    Additional instructions:  You have been given orders for a cholesterol check.  You will need to fast after midnight.  You may have black coffee, diet soda or water before the test.  You may take your medications before the test.  Coronary artery disease risk factors you can control: Smoking, high blood pressure, high cholesterol, diabetes, being overweight, lack of exercise and stress.  Continue heart healthy diet.  Take medications as directed.  Exercise as tolerated. Strive for 15 minutes of exercise most days of the week.   If asked to keep a blood pressure log, do so for 2 weeks. Call the office to report readings at [phone].  Blood pressure goal is 140/90 or less. If you are a diabetic, the goal is 130/80 or less.  If you are taking cholesterol lowering medications, it is recommended that lab work be checked annually.  Always keep a current medication list. Bring your medications to every office visit.     How to take:  NITROGLYCERIN (Nitrostat) 0.4 mg tablets, sublingual.  Nitroglycerin is in a group of drugs called nitrates. Nitroglycerin dilates (widens) blood vessels, making it easier for blood to flow through them and easier for the heart to pump.  Dosing Guidelines for Nitroglycerin Tablets   At the start of an angina (chest pain) attack, place one tablet under the tongue or between the cheek and gum.  Do not  swallow or chew the tablet; let it dissolve on its own.  If necessary, a second and third tablet may be used, with five minutes between using each tablet.  If you use a third tablet and your chest pain continues, it is time to seek immediate medical attention.  Call 911 immediately and have someone drive you to the emergency room.  You may be having a heart attack or other serious heart problem.   To prevent angina from exercise or stress, use 1 tablet 5 to 10 minutes before the activity.    Learning About Coronary Artery Disease (CAD)  What is coronary artery disease?    Coronary artery disease (CAD) occurs when plaque builds up in the arteries that bring oxygen-rich blood to your heart. Plaque is a fatty substance made of cholesterol, calcium, and other substances in the blood. This process is called hardening of the arteries, or atherosclerosis.  What happens when you have coronary artery disease?   Plaque may narrow the coronary arteries. Narrowed arteries cause poor blood flow. This can lead to angina symptoms such as chest pain or discomfort. If blood flow is completely blocked, you could have a heart attack.   You can slow CAD and reduce the risk of future problems by making changes in your lifestyle. These include quitting smoking and eating heart-healthy foods.   Treatments for CAD, along with changes in your lifestyle, can help you live a longer and healthier life.  How can you prevent coronary artery disease?   Do not smoke. It may be the best thing you can do to prevent heart disease. If you need help quitting, talk to your doctor about stop-smoking programs and medicines. These can increase your chances of quitting for good.   Be active. Get at least 30 minutes of exercise on most days of the week. Walking is a good choice. You also may want to do other activities, such as running, swimming, cycling, or playing tennis or team sports.   Eat heart-healthy foods. Eat more fruits and vegetables and  less foods that contain saturated and trans fats. Limit alcohol, sodium, and sweets.   Stay at a healthy weight. Lose weight if you need to.   Manage other health problems such as diabetes, high blood pressure, and high cholesterol.   Manage stress. Stress can hurt your heart. To keep stress low, talk about your problems and feelings. Don't keep your feelings hidden.   If you have talked about it with your doctor, take a  low-dose aspirin every day. Aspirin can help certain people lower their risk of a heart attack or stroke. But taking aspirin isn't right for everyone, because it can cause serious bleeding. Do not start taking daily aspirin unless your doctor knows about it.  How is coronary artery disease treated?   Your doctor will suggest that you make lifestyle changes. For example, your doctor may ask you to eat healthy foods, quit smoking, lose extra weight, and be more active.   You will have to take medicines.   Your doctor may suggest a procedure to open narrowed or blocked arteries. This is called angioplasty. Or your doctor may suggest using healthy blood vessels to create detours around narrowed or blocked arteries. This is called bypass surgery.  Follow-up care is a key part of your treatment and safety. Be sure to make and go to all appointments, and call your doctor if you are having problems. It's also a good idea to know your test results and keep a list of the medicines you take.  Where can you learn more?  Go to [email or web address] and sign in to your MyChart account. Enter [phone] in the Search Health Information box to learn more about "Learning About Coronary Artery Disease (CAD)."     If you do not have an account, please click on the "Sign Up Now" link.  Current as of: February 27, 2015  Content Version: 11.3   2006-2017 [employer]. Care instructions adapted under license by [facility]. If you have questions about a medical condition or this  instruction, always ask your healthcare professional. Healthwise, Incorporated disclaims any warranty or liability for your use of this information.     A Healthy Heart: Care Instructions  Your Care Instructions    Heart disease occurs when a substance called plaque builds up in the vessels that supply oxygen-rich blood to your heart. This can narrow the blood vessels and reduce blood flow. A heart attack happens when blood flow is completely blocked. A high-fat diet, smoking, and other factors increase the risk of heart disease.  Your doctor has found that you have a chance of having heart disease. You can do lots of things to keep your heart healthy. It may not be easy, but you can change your diet, exercise more, and quit smoking. These steps really work to lower your chance of heart disease.  Follow-up care is a key part of your treatment and safety. Be sure to make and go to all appointments, and call your doctor if you are having problems. It's also a good idea to know your test results and keep a list of the medicines you take.  How can you care for yourself at home?  Diet   Use less salt when you cook and eat. This helps lower your blood pressure. Taste food before salting. Add only a little salt when you think you need it. With time, your taste buds will adjust to less salt.   Eat fewer snack items, fast foods, canned soups, and other high-salt, high-fat, processed foods.   Read food labels and try to avoid saturated and trans fats. They increase your risk of heart disease by raising cholesterol levels.   Limit the amount of solid fat-butter, margarine, and shortening-you eat. Use olive, peanut, or canola oil when you cook. Bake, broil, and steam foods instead of frying them.   Eating fish can lower your risk for heart disease. Eat at least 2 servings of fish a week. Salmon, mackerel,  herring, sardines, and chunk light tuna are very good choices. These fish contain omega-3 fatty acids.   Eat a variety  of fruit and vegetables every day. Dark green, deep orange, red, or yellow fruits and vegetables are especially good for you. Examples include spinach, carrots, peaches, and berries.   Foods high in fiber can reduce your cholesterol and provide important vitamins and minerals. High-fiber foods include whole-grain cereals and breads, oatmeal, beans, brown rice, citrus fruits, and apples.   Limit drinks and foods with added sugar. These include candy, desserts, and soda pop.  Lifestyle changes   If your doctor recommends it, get more exercise. Walking is a good choice. Bit by bit, increase the amount you walk every day. Try for at least 30 minutes on most days of the week. You also may want to swim, bike, or do other activities.   Do not smoke. If you need help quitting, talk to your doctor about stop-smoking programs and medicines. These can increase your chances of quitting for good. Quitting smoking may be the most important step you can take to protect your heart. It is never too late to quit. You will get health benefits right away.   Limit alcohol to 2 drinks a day for men and 1 drink a day for women. Too much alcohol can cause health problems.  Medicines   Take your medicines exactly as prescribed. Call your doctor if you think you are having a problem with your medicine.   If your doctor recommends aspirin, take the amount directed each day. Make sure you take aspirin and not another kind of pain reliever, such as acetaminophen (Tylenol). If you take ibuprofen (such as Advil or Motrin) for other problems, take aspirin at least 2 hours before taking ibuprofen.  When should you call for help?  Call 911 if you have symptoms of a heart attack. These may include:   You have chest pain or pressure. This may occur with:   Chest pain or pressure, or a strange feeling in the chest.   Sweating.   Shortness of breath.   Pain, pressure, or a strange feeling in the back, neck, jaw, or upper belly or in one or  both shoulders or arms.   Lightheadedness or sudden weakness.   A fast or irregular heartbeat.  After you call 911, the operator may tell you to chew 1 adult-strength or 2 to 4 low-dose aspirin. Wait for an ambulance. Do not try to drive yourself.  Watch closely for changes in your health, and be sure to contact your doctor if:   Your symptoms are slowly getting worse.   You do not get better as expected.  Where can you learn more?  Go to [email or web address] and sign in to your MyChart account. Enter [phone] in the Search Health Information box to learn more about "A Healthy Heart: Care Instructions."     If you do not have an account, please click on the "Sign Up Now" link.  Current as of: June 03, 2015  Content Version: 11.3   2006-2017 Healthwise, Inc[employer]. Care instructions adapted under license by [facility]. If you have questions about a medical condition or this instruction, always ask your healthcare professional. Healthwise, Inc[employer] disclaims any warranty or liability for your use of this information.    QUIT NOW [place] SMOKING CESSATION PROGRAM    How Do I Get Started  Quitting tobacco is a process. The first step is the hardest. When you are ready to quit,  Quit Now [place] can help you with each step in the quitting process.  The Program  Quit Now [place] is a FREE online service available to [place] residents 42 years of age and over. When you become a member, you get special tools, a support team of coaches, research-based information, and a community of others trying to become tobacco free. Our expert coaches can talk to you about overcoming common barriers, such as dealing with stress, fighting cravings, coping with irritability, and controlling weight gain.  We also offer a free telephone service, so you can speak to a coach in person, if you would prefer. Call the [place] [facility] at [place] or [phone].   What if I don't qualify for the Program?  You must  be 52 years of age or older to participate in the program. It is also helpful to discuss quitting with your doctor.   How do I enroll in the Quit Now [place] online program?  Complete the brief Enroll Now form. If you are a [place] resident over 31 years of age, you will receive an email letting you know that you are enrolled. You can use the online service as often as you want!   How do I enroll in both the online and telephone program?  Complete the brief Enroll Now form. If you qualify, you will receive an email letting you know that you are enrolled. You can use the online service as often as you want!  While completing the Enroll Now form you indicate the best time for a coach to give you a call. If you would like, you can call the QuitLine at 1-800-QUIT-NOW.  We're here 7 days a week.   Monday - Sunday 7 a.m. - 12 a.m. CST  Monday - Sunday 8 a.m. - 1 a.m. EST  If you call when we are closed, please leave a message and we will call you back.          Tips to Help You Stop Smoking   Cigarette smoking is a preventable cause of death in the [place].  If you have thought about quitting but haven't been able to, here are some reasons why you should and some ways to do it.     Here's Why   Quitting smoking now can decrease your risk of getting smoking-related illnesses like:   Heart disease   Stroke   Several types of cancer, including:   Lung, Mouth, Esophagus, Larynx, Bladder, Pancreas, Kidney   Chronic lung diseases:   Bronchitis, Emphysema, Asthma   Cataracts   Macular degeneration   Thyroid conditions   Hearing loss   Erectile dysfunction   Dementia   Osteoporosis   Here's How   Once you've decided to quit smoking, set your target quit date a few weeks away. In the time leading up to your quit day, try some of these ideas offered by the Tobacco Control Research Branch of the [employer] to help you successfully quit smoking.   For the best results, work with your doctor. Together, you can  test your lung function and compare the results to those of a nonsmoking person. The results can be given to you as your lung age. Finding out your lung age right after having the test done may help you to stop smoking.   Your doctor can also discuss with you all of your options and refer you to smoking-cessation support groups. You may wish to use nicotine replacement (gum, patches, inhaler) or one  of the prescription medications that have been shown to increase quit rates and prolong abstinence from smoking. But whatever you and your doctor decide on these matters, it will still be you who decides when an how to quit. Here are some techniques:   Switch Brands   Switch to a brand you find distasteful.   Change to a brand that is low in tar and nicotine a couple of weeks before your target quit date. This will help change your smoking behavior. However, do not smoke more cigarettes, inhale them more often or more deeply, or place your fingertips over the holes in the filters. All of these actions will increase your nicotine intake, and the idea is to get your body used to functioning without nicotine.   Cut Down the Number of Cigarettes You Smoke   Smoke only half of each cigarette.   Each day, postpone the lighting of your first cigarette by one hour.   Decide you'll only smoke during odd or even hours of the day.   Decide beforehand how many cigarettes you'll smoke during the day. For each additional cigarette, give a dollar to your favorite charity.   Change your eating habits to help you cut down. For example, drink milk, which many people consider incompatible with smoking. End meals or snacks with something that won't lead to a cigarette.   Reach for a glass of juice instead of a cigarette for a "pick-me-up."   Remember: Cutting down can help you quit, but it's not a substitute for quitting. If you're down to about seven cigarettes a day, it's time to set your target quit date, and get ready to stick to it.      Don't Smoke "Automatically"   Smoke only those cigarettes you really want. Catch yourself before you light up a cigarette out of pure habit.   Don't empty your ashtrays. This will remind you of how many cigarettes you've smoked each day, and the sight and the smell of stale cigarettes butts will be very unpleasant.   Make yourself aware of each cigarette by using the opposite hand or putting cigarettes in an unfamiliar location or a different pocket to break the automatic reach.   If you light up many times during the day without even thinking about it, try to look in a mirror each time you put a match to your cigarette. You may decide you don't need it.   Make Smoking Inconvenient   Stop buying cigarettes by the carton. Wait until one pack is empty before you buy another.   Stop carrying cigarettes with you at home or at work. Make them difficult to get to.   Make Smoking Unpleasant   Smoke only under circumstances that aren't especially pleasurable for you. If you like to smoke with others, smoke alone. Turn your chair to an empty corner and focus only on the cigarette you are smoking and all its many negative effects.   Collect all your cigarette butts in one large glass container as a visual reminder of the filth made by smoking.   Just Before Quitting   Practice going without cigarettes.   Don't think of never smoking again. Think of quitting in terms of one day at a time .   Tell yourself you won't smoke today, and then don't.   Clean your clothes to rid them of the cigarette smell, which can linger a long time.   On the Day You Quit   Throw away all your  cigarettes and matches. Hide Engineer, mining.   Visit the dentist and have your teeth cleaned to get rid of tobacco stains. Notice how nice they look and resolve to keep them that way.   Make a list of things you'd like to buy for yourself or someone else. Estimate the cost in terms of packs of cigarettes, and put the money aside to buy these  presents.   Keep very busy on the big day. Go to the movies, exercise, take long walks, or go bike riding.   Remind your family and friends that this is your quit date, and ask them to help you over the rough spots of the first couple of days and weeks.   Buy yourself a treat or do something special to celebrate.     Telephone and Internet Support   Call the [facility] [employer] [facility] [employer].  Telephone, web-, and computer-based programs can offer you the support that you need to quit and to stay smoke-free. You can find many programs online.  Immediately After Quitting   Develop a clean, fresh, nonsmoking environment around yourself at work and at home. Buy yourself flowers you may be surprised how much you can enjoy their scent now.   The first few days after you quit, spend as much free time as possible in places where smoking isn't allowed, such as libraries, museums, theaters, department stores, and churches.   Drink large quantities of water and fruit juice (but avoid sodas that contain caffeine).   Try to avoid alcohol, coffee, and other beverages that you associate with cigarette smoking.   Strike up conversation instead of a Microbiologist for a cigarette.   If you miss the sensation of having a cigarette in your hand, play with something else a pencil, a paper clip, a marble.   If you miss having something in your mouth, try toothpicks or a fake cigarette.      Deciding About Using Medicines To Quit Smoking  What are the medicines you can use?  Your doctor may prescribe varenicline (Chantix) or bupropion (Zyban). These medicines can help you cope with cravings for tobacco. They are pills that don't contain nicotine.  You also can use nicotine replacement products. These do contain nicotine. There are many types.   Gum and lozenges slowly release nicotine into your mouth.   Patches stick to your skin. They slowly release nicotine into your bloodstream.   An inhaler has a holder that contains nicotine. You  breathe in a puff of nicotine vapor through your mouth and throat.   Nasal spray releases a mist that contains nicotine.  What are key points about this decision?   Using medicines can double your chances of quitting smoking. They can ease cravings and withdrawal symptoms.   Getting counseling along with using medicine can raise your chances of quitting even more.   If you smoke fewer than 5 cigarettes a day, you may not need medicines to help you quit smoking.   These medicines have less nicotine than cigarettes. And by itself, nicotine is not nearly as harmful as smoking. The tars, carbon monoxide, and other toxic chemicals in tobacco cause the harmful effects.   The side effects of nicotine replacement products depend on the type of product. For example, a patch can make your skin red and itchy. Medicines in pill form can make you sick to your stomach. They can also cause dry mouth and trouble sleeping. For most people, the side effects are  not bad enough to make them stop using the products.   FDA warning. The U.S. Food and Drug Administration (FDA) warns that people who are taking bupropion or varenicline and who have any serious or unusual changes in mood or behavior or who feel like hurting themselves or someone else should stop taking the medicine and call a doctor right away. If you already have a mood or behavior problem, be sure to tell your doctor before you decide to use these medicines.  Why might you choose to use medicines to quit smoking?   You have tried on your own to stop smoking, but you were not able to stop.   You smoke more than 5 cigarettes a day.   You want to increase your chances of quitting smoking.   You want to reduce your cravings and withdrawal symptoms.   You feel the benefits of medicine outweigh the side effects.  Why might you choose not to use medicine?   You want to try quitting on your own by stopping all at once ("cold [place]").   You want to cut back slowly on  the number of cigarettes you smoke.   You smoke fewer than 5 cigarettes a day.   You do not like using medicine.   You feel the side effects of medicines outweigh the benefits.   You are worried about the cost of medicines.  Your decision  Thinking about the facts and your feelings can help you make a decision that is right for you. Be sure you understand the benefits and risks of your options, and think about what else you need to do before you make the decision.  Where can you learn more?  Go to [email or web address] and sign in to your MyChart account. Enter [phone] in the Search Health Information box to learn more about "Deciding About Using Medicines To Quit Smoking."   If you do not have an account, please click on the "Sign Up Now" link.  Current as of: May 20, 2015  Content Version: 11.3   2006-2017 [employer], Inc[employer]. Care instructions adapted under license by [facility]. If you have questions about a medical condition or this instruction, always ask your healthcare professional. Health[employer], Incorporated disclaims any warranty or liability for your use of this information.    bupropion  Pronunciation:  byoo PRO pee on  Brand:  Aplenzin, Buproban, Forfivo XL, Wellbutrin SR, Wellbutrin XL, Zyban, Zyban Advantage Pack  What is the most important information I should know about bupropion?  You should not take bupropion if you have seizures, an eating disorder, or if you have suddenly stopped using alcohol, seizure medication, or sedatives. If you take Wellbutrin for depression, do not also take Zyban to quit smoking.   Do not use this medicine if you have used an MAO inhibitor in the past 14 days, such as isocarboxazid, linezolid, methylene blue injection, phenelzine, rasagiline, selegiline, or tranylcypromine.  Some young people have thoughts about suicide when first taking an antidepressant. Your doctor will need to check your progress at regular visits.  Report any new or  worsening symptoms to your doctor, such as unusual mood or behavior changes.  What is bupropion?  Bupropion is an antidepressant medication used to treat major depressive disorder and seasonal affective disorder. The Zyban brand of bupropion is used to help people stop smoking by reducing cravings and other withdrawal effects.  Bupropion may also be used for purposes not listed in this medication guide.  What should I discuss  with my healthcare provider before taking bupropion?  You should not take bupropion if you are allergic to it, or if you have:   epilepsy or a seizure disorder;   an eating disorder such as anorexia or bulimia; or   if you have suddenly stopped using alcohol, seizure medication, or a sedative (Valium, Nembutal, Seconal, Solfoton, and others).  Do not take bupropion if you have used an MAO inhibitor in the past 14 days. A dangerous drug interaction could occur. MAO inhibitors include isocarboxazid, linezolid, methylene blue injection, phenelzine, rasagiline, selegiline, tranylcypromine, and others.  Do not take bupropion to treat more than one condition at a time. If you take Wellbutrin for depression, do not also take Zyban to quit smoking.   Bupropion may cause seizures, especially in people who have certain medical conditions or use certain drugs. Tell your doctor about all of your medical conditions and the drugs you use.  To make sure bupropion is safe for you, tell your doctor if you have:   a history of head injury, seizures, or brain or spinal cord tumor;   narrow-angle glaucoma;   heart disease, high blood pressure, history of heart attack;   diabetes;   kidney or liver disease (especially cirrhosis);   bipolar disorder (manic depression); or   if you drink alcohol.  Some young people have thoughts about suicide when first taking an antidepressant. Your doctor will need to check your progress at regular visits. Your family or other caregivers should also be alert to changes in  your mood or symptoms.  It is not known whether this medicine will harm an unborn baby. Tell your doctor if you are pregnant or plan to become pregnant.  Bupropion can pass into breast milk and may harm a nursing baby. You should not breast-feed while using this medicine.  Bupropion is not approved for use by anyone younger than 43 years old.  How should I take bupropion?  Follow all directions on your prescription label. Do not take this medicine in larger or smaller amounts or for longer than recommended. Too much of this medicine can increase your risk of a seizure.  You may take bupropion with or without food.  Do not crush, chew, or break an extended-release tablet. Swallow it whole.  You should not change your dose or stop using bupropion suddenly, unless you have a seizure while taking this medicine. Stopping suddenly can cause unpleasant withdrawal symptoms. Ask your doctor how to safely stop using bupropion.  If you take Zyban to help you stop smoking, you may continue to smoke for about 1 week after you start the medicine. Set a date to quit smoking during the second week of treatment. Talk to your doctor if you have trouble quitting after taking Zyban for 7 weeks.  Your doctor may prescribe nicotine patches or gum to help you stop smoking. Do not smoke at any time if you are using a nicotine product along with Zyban. Too much nicotine can cause serious side effects.  Read all patient information, medication guides, and instruction sheets provided to you. Ask your doctor or pharmacist if you have any questions.  You may have nicotine withdrawal symptoms when you stop smoking, including: increased appetite, weight gain, trouble sleeping, trouble concentrating, slower heart rate, having the urge to smoke, and feeling anxious, restless, depressed, angry, frustrated, or irritated. These symptoms may occur with or without using medication such as Zyban.   Smoking cessation may also cause new or worsening mental  health problems, such as depression.  Bupropion can cause you to have a false positive drug screening test. If you provide a urine sample for drug screening, tell the laboratory staff that you are taking bupropion.  Store at room temperature away from moisture, heat, and light.  What happens if I miss a dose?  Take the missed dose as soon as you remember. Skip the missed dose if it is almost time for your next scheduled dose. Do not  take extra medicine to make up the missed dose.  What happens if I overdose?  Seek emergency medical attention or call the Poison Help line at [phone]. An overdose of bupropion can be fatal.  Overdose symptoms may include muscle stiffness, hallucinations, fast or uneven heartbeat, shallow breathing, or fainting.  What should I avoid while taking bupropion?  Drinking alcohol with bupropion may increase your risk of seizures. If you drink alcohol regularly, talk with your doctor before changing the amount you drink. Bupropion can also cause seizures in a regular drinker who suddenly stops drinking at the start of treatment with bupropion.  Bupropion may impair your thinking or reactions. Be careful if you drive or do anything that requires you to be alert.  What are the possible side effects of bupropion?  Get emergency medical help if you have signs of an allergic reaction: hives, rash or itching; fever, swollen glands, joint pain, general ill feeling; difficult breathing; swelling of your face, lips, tongue, or throat.  You may have changes in thinking or behavior while taking Zyban to help you quit smoking. If you have any of the following symptoms, stop taking Zyban and call your doctor right away:   mood or behavior changes, unusual thoughts or feelings;   feeling anxious, restless, or depressed;   agitation, hostility, aggression;   impulsive or risk-taking behavior;   confusion, paranoia, hallucinations, panic; or   thoughts about suicide or hurting yourself.  Report  any new or worsening symptoms to your doctor.  Call your doctor at once if you have:   a seizure (convulsions);   a manic episode --racing thoughts, increased energy, reckless behavior, feeling extremely happy or irritable, talking more than usual, severe problems with sleep;   blurred vision, tunnel vision, eye pain or swelling, or seeing halos around lights;   fast heartbeats;   severe skin reaction --fever, sore throat, swelling in your face or tongue, burning in your eyes, skin pain, followed by a red or purple skin rash that spreads (especially in the face or upper body) and causes blistering and peeling.  Common side effects may include:   dry mouth, stuffy nose;   nausea, vomiting, constipation;   feeling anxious, nervous, or shaky;   headache, dizziness;   sleep problems (insomnia);   heavy sweating; or   joint pain.  This is not a complete list of side effects and others may occur. Call your doctor for medical advice about side effects. You may report side effects to FDA at 1-800-FDA-1088.  What other drugs will affect bupropion?  You may have a higher risk of seizures if you use certain other medicines while taking bupropion.   Many drugs can interact with bupropion.  Tell your doctor about all medicines you use, and those you start or stop using during your treatment with bupropion. This includes prescription and over-the-counter medicines, vitamins, and herbal products. Not all possible interactions are listed in this medication guide.   Where can I get more information?  Your pharmacist can  provide more information about bupropion.    Remember, keep this and all other medicines out of the reach of children, never share your medicines with others, and use this medication only for the indication prescribed.  Every effort has been made to ensure that the information provided by [employer]') is accurate, up-to-date, and complete, but no guarantee is made to that effect. Drug  information contained herein may be time sensitive. [employer] information has been compiled for use by healthcare practitioners and consumers in the [place] and therefore [employer] does not warrant that uses outside of the [place] are appropriate, unless specifically indicated otherwise. [employer]'s drug information does not endorse drugs, diagnose patients or recommend therapy. [employer]'s drug information is an Investment [employer], corporate to assist licensed healthcare practitioners in caring for their patients and/or to serve consumers viewing this service as a supplement to, and not a substitute for, the expertise, skill, knowledge and judgment of healthcare practitioners. The absence of a warning for a given drug or drug combination in no way should be construed to indicate that the drug or drug combination is safe, effective or appropriate for any given patient. [employer] does not assume any responsibility for any aspect of healthcare administered with the aid of information [employer] provides. The information contained herein is not intended to cover all possible uses, directions, precautions, warnings, drug interactions, allergic reactions, or adverse effects. If you have questions about the drugs you are taking, check with your doctor, nurse or pharmacist.  Copyright [phone] [employer] Version: 19.01. Revision date: 02/27/2015.  Care instructions adapted under license by [facility]. If you have questions about a medical condition or this instruction, always ask your healthcare professional. Healthwise, Incorporated disclaims any warranty or liability for your use of this information.     Aloha Gell, APRN

## 2016-02-18 NOTE — Telephone Encounter (Signed)
Patient states he had low blood pressure today at work.  Patient states BP was 103/60 HR 70's.  Patient states he was bending down at work, got hot in the face and thought pressure was up.  Patient took BP at work and it was lower than normal.  Patient states he drank some fluids and BP was 108/58.  Patient denies feeling dizzy, having any c/p, soa, weakness, nausea or diaphoresis.  Patient was advised to continue to monitor BP and let us know if it consistently remains low and we will get appointment or have medications reviewed.  Understanding voiced.

## 2016-02-27 ENCOUNTER — Encounter

## 2016-02-27 MED ORDER — METOPROLOL TARTRATE 25 MG PO TABS
25 | ORAL_TABLET | Freq: Two times a day (BID) | ORAL | 5 refills | Status: DC
Start: 2016-02-27 — End: 2016-08-28

## 2016-02-27 MED ORDER — CLOPIDOGREL BISULFATE 75 MG PO TABS
75 MG | ORAL_TABLET | Freq: Every day | ORAL | 5 refills | Status: DC
Start: 2016-02-27 — End: 2016-08-28

## 2016-02-27 MED ORDER — ATORVASTATIN CALCIUM 20 MG PO TABS
20 MG | ORAL_TABLET | Freq: Every evening | ORAL | 5 refills | Status: DC
Start: 2016-02-27 — End: 2016-08-28

## 2016-02-27 MED ORDER — LISINOPRIL 2.5 MG PO TABS
2.5 MG | ORAL_TABLET | Freq: Two times a day (BID) | ORAL | 5 refills | Status: DC
Start: 2016-02-27 — End: 2016-08-28

## 2016-04-07 NOTE — Telephone Encounter (Signed)
pt wants to resched [place] appt that is 03/28 and move it to April w/ Chase Picket, please call pt to resched

## 2016-05-27 ENCOUNTER — Encounter: Attending: Cardiovascular Disease | Primary: Family Medicine

## 2016-06-18 ENCOUNTER — Ambulatory Visit
Admit: 2016-06-18 | Discharge: 2016-06-18 | Payer: BLUE CROSS/BLUE SHIELD | Attending: Cardiovascular Disease | Primary: Family Medicine

## 2016-06-18 DIAGNOSIS — R9430 Abnormal result of cardiovascular function study, unspecified: Secondary | ICD-10-CM

## 2016-06-18 NOTE — Patient Instructions (Signed)
Smoking Cessation for adults:    Gaining Health Benefits   Many people do not realize that smoking cessation has immediate as well as long-term benefits. Here are some benefits found in individuals, both young and old, who stop smoking:     In one day:   Blood circulation increases.   Carbon monoxide levels in the blood decrease.   Heart rate and blood pressure decrease.   The risk of having a heart attack decreases.   In several days to several weeks:   Sense of taste and smell improves.   Lung capacity increases.   Breathing becomes easier.   In several weeks to nine months:   Energy level increases.   Lungs become cleaner and more functional.   Colds and other respiratory tract infections become less common.   Sinus congestion decreases.   Shortness of breath decreases.   Long-term benefits (several years to 10+ years):   Risk of heart disease and lung cancer decreases. (Risk can eventually be similar to that of a lifelong nonsmoker.)   Risk of cancers of the mouth, esophagus, larynx, bladder, pancreas, and kidney decreases.     Gaining Even More Benefits   Quitting smoking has additional health benefits, such as decreased risk of peripheral vascular disease, stroke, and chronic lung disease ( bronchitis, emphysema, and asthma). Giving up cigarettes may also reduce your risk of cataracts, macular degeneration, thyroid conditions, hearing loss, erectile dysfunction, dementia, and osteoporosis.     Even if you already have a chronic disease, quitting smoking may help reduce the severity of your symptoms and keep you healthier longer. Still think it is too late?     You may be surprised to hear that older smokers are usually more successful at quitting smoking than younger smokers. This is especially true if they already have health problems, particularly those associated with smoking.     Studies suggest that elderly persons who ask their doctors about help for smoking cessation are more likely to get that help and  may be more likely to be successful quitters. At your next medical visit do not forget to ask what you and your doctor together can do to help you kick the habit.     Preparing to Quit   List all the reasons you want to quit smoking and look at your list often.   Get help from your doctor, a smoking cessation specialist, or a group cessation program. Discuss using nicotine replacement products (patch, chewing gum, or nasal spray) along with a behavior change program.   One week before you quit, keep a journal of when and where you smoke each cigarette. Record how you are feeling each time (eg, happy, anxious, relaxed, angry, sad, lonely). This will help you be more aware of your smoking patterns.   Choose a method of quitting, such as gradually cutting back or quitting all at once. Quitting all at once tends to be most effective.   Set a quit date on your calendar.     Using Helpful Strategies   On quit day, throw out all your cigarettes and ashtrays.   Review your smoking journal and identify your smoking patterns. If you regularly smoke in certain places at certain times (in the kitchen after a meal, for example), change your routine (get up from the table after eating). Identify other high-risk situations such as stress, depression, and being around other smokers. Have a plan for every situation.   Create a list of ways to distract  yourself from a cigarette craving. Examples include calling a friend, taking a walk, chewing gum, or taking a warm bath.   Reward yourself with a treat (not food) for every week you do not smoke. Put the money you save in a jar and watch it grow.   Have a supportive "buddy" (preferably an ex-smoker) you can call during the rough times.   To avoid weight gain, eat low-fat meals and snacks with lots of fruit, vegetables, and whole grains. Drink lots of water. Exercise daily. Consult a nutritionist if weight gain becomes a problem.   Withdrawal symptoms should go away in a few days.  Nicotine replacement products and medicines like bupropion can help. Try to get more rest and relaxation.     Learning How to Handle Stress   Many people go back to smoking sometimes years after quitting when a crisis hits. Plan ahead for how you will handle a stressful event such as a death, divorce, retirement, illness, etc. That way, you will not be caught off guard.     Most ex-smokers make several attempts to quit before they are successful. If you start smoking again, do not let feelings of regret, guilt, or failure get a handle on you. Learn from your setbacks and get right back on the program. It is not too late!

## 2016-06-22 NOTE — Telephone Encounter (Signed)
Patient is returning a call to [facility] and ask that you call him after 3:00.

## 2016-06-22 NOTE — Telephone Encounter (Signed)
Discussed with patient

## 2016-06-26 NOTE — Progress Notes (Signed)
[facility]  [place], [address] [place], [place]  [address]  The following was transcribed by Marta Antu, M.T.     Gregory Escobar DOB: 1971/02/14, 46 y.o. Male        Office Visit:  06/18/2016    Chief Complaint   Patient presents with   . 1 Year Follow Up     no cardiac complaints.   . Coronary Artery Disease      HISTORY OF PRESENT ILLNESS  Gregory Escobar is seen here for coronary artery disease and hypertension.  This is a routine follow up.  Gregory Escobar presents doing very well.  He is out, he is active.  There is no angina, no overt heart failure, no syncope or stroke-like problems.      Patient Active Problem List   Diagnosis Code   . Coronary artery disease involving native coronary artery I25.10   . CAD (coronary artery disease) I25.10   . HTN (hypertension) I10   . Hyperlipemia E78.5   . Cardiac LV ejection fraction 30-35% R94.30   . Acute MI, anterior wall (HCC) I21.09   . History of myocardial infarction I25.2   . 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 Smoker F17.200     Past Medical History:   Diagnosis Date   . Acute MI, anterior wall ([facility]) 03/13/14   . CAD (coronary artery disease) 2016   . Cardiac LV ejection fraction 30-35%     MI 03/13/14 cath revealed EF 30-35 at that time   . HTN (hypertension)    . Hyperlipemia    . Kidney stones 05/2013     Past Surgical History:   Procedure Laterality Date   . LITHOTRIPSY  2015   . URETER STENT PLACEMENT  2015      Family History   Problem Relation Age of Onset   . Heart Disease Father    . High Blood Pressure Father    . High Cholesterol Father      Social History   Substance Use Topics   . Smoking status: Current Every Day Smoker     Packs/day: 0.25     Years: 20.00   . Smokeless tobacco: Former Systems developer     Types: Snuff     Quit date: 05/22/1990   . Alcohol use No      Comment: recovering addict      No Known Allergies  Outpatient Prescriptions Marked as Taking for the 06/18/16 encounter (Office Visit) with  Fredirick Maudlin, MD   Medication Sig Dispense Refill   . atorvastatin (LIPITOR) 20 MG tablet Take 1 tablet by mouth nightly 30 tablet 5   . clopidogrel (PLAVIX) 75 MG tablet Take 1 tablet by mouth daily 30 tablet 5   . lisinopril (PRINIVIL;ZESTRIL) 2.5 MG tablet Take 1 tablet by mouth 2 times daily 60 tablet 5   . metoprolol tartrate (LOPRESSOR) 25 MG tablet Take 1 tablet by mouth 2 times daily 60 tablet 5   . nitroGLYCERIN (NITROSTAT) 0.4 MG SL tablet Place 1 tablet under the tongue every 5 minutes as needed for Chest pain 25 tablet 3   . Multiple Vitamins-Minerals (THERAPEUTIC MULTIVITAMIN-MINERALS) tablet Take 1 tablet by mouth daily       Data:  BP Readings from Last 3 Encounters:   06/18/16 120/60   11/27/15 120/70   05/22/15 118/62    Pulse Readings from Last 3 Encounters:   06/18/16 66   11/27/15  52   05/22/15 68        REVIEW OF SYSTEMS - As per HPI, otherwise negative.  Ten organ review performed.     PHYSICAL EXAMINATION  GENERAL:  Alert and oriented x3 in no apparent distress.  Oriented to time, place and person.    Vital Signs:  BP 120/60   Pulse 66   Ht 5' 7"  (1.702 m)   Wt 150 lb (68 kg)   BMI 23.49 kg/m    HEAD:  Normocephalic without evidence of old or recent trauma.  EYES:  Sclerae clear.  Conjunctivae pink.  Pupils equal and round.  NOSE:  Negative nasal discharge or epistaxis.  NECK:  Supple without jugular venous distention.  No carotid bruits.     CHEST:  Equal bilateral expansion.  RESPIRATORY:  The lungs are clear.     CARDIOVASCULAR:  The heart's rhythm is regular without appreciable murmur or rub.  ABDOMEN:  Soft, nontender.  Exhibits no distension.  Bowel sounds are normal.   MUSCULOSKELETAL:  Normal muscle strength and tone.   SKIN:  Warm, dry.  NEUROLOGIC:  Cranial nerves II through XII are grossly intact.      IMPRESSION / PLAN  1.  Coronary artery disease without angina.  2.  We have once again discussed the need to cease tobacco usage.    3.  Continue same medications.   4.  We  will see him back in six months.               __________________________________  Celine Ahr. Andres Labrum, M.D., Ph.D., F.A.C.C.  [facility]    cc (pcp): Ermalene Postin, M.D.

## 2016-08-28 ENCOUNTER — Encounter

## 2016-08-28 MED ORDER — METOPROLOL TARTRATE 25 MG PO TABS
25 MG | ORAL_TABLET | ORAL | 3 refills | Status: DC
Start: 2016-08-28 — End: 2017-09-20

## 2016-08-28 MED ORDER — LISINOPRIL 2.5 MG PO TABS
2.5 MG | ORAL_TABLET | ORAL | 3 refills | Status: DC
Start: 2016-08-28 — End: 2017-09-20

## 2016-08-28 MED ORDER — CLOPIDOGREL BISULFATE 75 MG PO TABS
75 MG | ORAL_TABLET | ORAL | 3 refills | Status: DC
Start: 2016-08-28 — End: 2017-08-23

## 2016-08-28 MED ORDER — ATORVASTATIN CALCIUM 20 MG PO TABS
20 MG | ORAL_TABLET | ORAL | 3 refills | Status: DC
Start: 2016-08-28 — End: 2017-03-11

## 2016-12-21 ENCOUNTER — Ambulatory Visit: Admit: 2016-12-21 | Discharge: 2016-12-21 | Payer: BLUE CROSS/BLUE SHIELD | Attending: Family | Primary: Family Medicine

## 2016-12-21 DIAGNOSIS — I2510 Atherosclerotic heart disease of native coronary artery without angina pectoris: Secondary | ICD-10-CM

## 2016-12-21 NOTE — Patient Instructions (Addendum)
Continue current medications as prescribed.   Continue to follow up with primary care provider for non cardiac medical problems.  Call the office with any problems, questions or concerns at [phone].  Follow up as scheduled with your cardiologist- 6 months - Dr. Ardeth Sportsman.  The following educational material has been included in this after visit summary for your review: Life simple 7; heart health, smoking cessation.    Additional instructions:  You have been given orders for a cholesterol check.  Get done in December.  You will need to fast after midnight.  You may have black coffee, diet soda or water before the test.  You may take your medications before the test.  Coronary artery disease risk factors you can control: Smoking, high blood pressure, high cholesterol, diabetes, being overweight, lack of exercise and stress.  Continue heart healthy diet.  Take medications as directed.  Exercise as tolerated. Strive for 15 minutes of exercise most days of the week.   If asked to keep a blood pressure log, do so for 2 weeks. Call the office to report readings at [phone].  Blood pressure goal  is less than 120/70.    Elevated blood pressure at 120-129/80 or less.  High blood pressure at 130-139/80-89.  If you are taking cholesterol lowering medications, it is recommended that lab work be checked annually.  Always keep a current medication list. Bring your medications to every office visit.     Life simple 7  1) Manage blood pressure - high blood pressure is a major risk factor for heart disease and stroke. Keeping blood pressure in health range reduces strain on your heart, arteries and kidneys.  2) Control cholesterol - contributes to plaque, which can clog arteries and lead to heart disease and stroke. When you control your cholesterol you are giving your arteries their best chance to remain clear.  3) Reduce blood sugar - most of the food we eat is turning into glucose or blood sugar that our body uses for  energy.  Over time, high levels of blood sugar can damage your heart, kidneys, eyes and nerves.  4) Get active - living an active life is one of the most rewarding gifts you can give yourself and those you love.  Simply put, daily physical activity increases your length and quality of life.  5)  Eat better - A healthy diet is one of your best weapons for fighting cardiovascular disease.  When you eat a heart healthy diet, you improve your chances for feeling good and staying healthy for life.  6)  Lose weight - when you shed extra fat an unnecessary pounds, you reduce the burden on your hear, lungs, blood vessels and skeleton.  You give yourself the gift of active living, you lower your blood pressure and help yourself feel better.  7) Stop smoking - cigarette smokers have a higher risk of developing cardiovascular disease.  If  You smoke, quitting is the best thing you can do for your health.  Check American Heart Association on line for more information on Life's Simple 7 and tips for healthy living.    How to take:  NITROGLYCERIN (Nitrostat) 0.4 mg tablets, sublingual.  Nitroglycerin is in a group of drugs called nitrates. Nitroglycerin dilates (widens) blood vessels, making it easier for blood to flow through them and easier for the heart to pump.  Dosing Guidelines for Nitroglycerin Tablets   At the start of an angina (chest pain) attack, place one tablet under the  tongue or between the cheek and gum.  Do not swallow or chew the tablet; let it dissolve on its own.  If necessary, a second and third tablet may be used, with five minutes between using each tablet.  If you use a third tablet and your chest pain continues, it is time to seek immediate medical attention.  Call 911 immediately and have someone drive you to the emergency room.  You may be having a heart attack or other serious heart problem.   To prevent angina from exercise or stress, use 1 tablet 5 to 10 minutes before the activity.    QUIT NOW  [place] SMOKING CESSATION PROGRAM  How Do I Get Started  Quitting tobacco is a process. The first step is the hardest. When you are ready to quit, Quit Now [place] can help you with each step in the quitting process.  The Program  Quit Now [place] is a FREE online service available to [place] residents 44 years of age and over. When you become a member, you get special tools, a support team of coaches, research-based information, and a community of others trying to become tobacco free. Our expert coaches can talk to you about overcoming common barriers, such as dealing with stress, fighting cravings, coping with irritability, and controlling weight gain.  We also offer a free telephone service, so you can speak to a coach in person, if you would prefer. Call the [facility] at [place] or [phone].   What if I don't qualify for the Program?  You must be 55 years of age or older to participate in the program. It is also helpful to discuss quitting with your doctor.   How do I enroll in the Quit Now [place] online program?  Complete the brief Enroll Now form. If you are a [place] resident over 55 years of age, you will receive an email letting you know that you are enrolled. You can use the online service as often as you want!   How do I enroll in both the online and telephone program?  Complete the brief Enroll Now form. If you qualify, you will receive an email letting you know that you are enrolled. You can use the online service as often as you want!  While completing the Enroll Now form you indicate the best time for a coach to give you a call. If you would like, you can call the QuitLine at 1-800-QUIT-NOW.  We're here 7 days a week.   Monday - Sunday 7 a.m. - 12 a.m. CST  Monday - Sunday 8 a.m. - 1 a.m. EST  If you call when we are closed, please leave a message and we will call you back.      [place]izzling Ways to Stop Smoking        Quitting smoking is one of the most daunting challenges you will  face in your life. It is an addiction that is both physical and psychological. But, quitting smoking can be done. In fact, you will have plenty of company: millions of Americans are former smokers.   There are certainly plenty of reasons to quit when you consider smoking's fatal link with lung cancer , emphysema , and heart disease , and the harmful effects of second-hand smoke on your family.   You have seen the warnings. Heard the discussions. Received the advice. Listened to your kids nag you about it. You know you should quit smoking, but where do you start? Knowing what you are up  against can help you form a successful plan to quit smoking.   The Mind and Body Connection   Smoking is addictiveboth physically and psychologically. The physical addiction can be traced to the nicotine in each cigarette. It hooks you just as completely as other drugs, say researchers, and the withdrawal symptomscravings, anxiety , nausea, cramps, depression , and dizzinessare similar.   Nicotine surges through the bloodstream and gives smokers a higha quick jolt that makes them think they feel better. But, what really happens is that smokers develop a tolerance for nicotine, which is why they increase from one pack a day to two packs a day.   The psychological addiction is, in its own way, just as bad. Smoking becomes second nature, like blinking or breathing. If you consider that one pack of cigarettes can turn into 150 to 200 puffs a day, seven days a week, 52 weeks a year, you will see how hard it is to de-program yourself.   The Key to Quitting   "You know, there is no magic bullet, no device that will make it easy," says Tommye Standard, who smoked for 13 years before quitting in 1989, and has written a book and taught seminars on quitting. But, you can quit.   "The thing to keep in mind is that almost everyone who quits has to try more than once," says Macarthur Critchley, MD, a past president of the [employer]. "You should  not be discouraged. It is more rare to quit on the first try than on the fifth"   The key to quitting, say the experts, is patience, perseverance, and having a plan.   How to Do It   Keep these points in mind when you quit:   Know Why You're Quitting   Pick a reason that you believe in, be it for your family or for yourself. If you do not believe in your reason, it is that much harder to quit.   Change Your Environment   Worry about not smoking for just one day, and not for the rest of your life. Besides, it gets easier to stave off the desire the longer you do not smoke. The nicotine will be gone from your system in 3-5 days, and after about a month the worst of the withdrawal symptoms will go away.   Taper Off   Some studies show that a majority of permanent quitters achieved their goal by quitting "cold [place]." But, there are many other options, like slowly decreasing the number of cigarettes you smoke.   The key to tapering off is to cut down the number of cigarettes you smoke each day. One way to do this, says Duffey, is to delay the first cigarette of the day. She recommends the two-hour approach. If you have your first smoke at 7 a.m., try holding out until 9 for a couple of days. Then, push it back until 11, and so on. By the end of four weeks, you will not be smoking at all. Whether you taper or quit cold, your goal must be the same: abstinence. If you choose to taper, do not let the process give you an excuse to delay the final step of quitting entirely. Set a quit day and stick to it.   Overwhelm the Addiction   Think about the things that lead to lighting up, and do not do them. Get rid of the ashtrays at home. Do not come back from lunch 15 minutes early to sneak in  a cigarette break. Avoid places where smoking is part of the atmosphere.   Practice the Three D's   Delay; deep breathing; drink water. When you feel like a smoke, delay. Try to think of something else. Breathe deeply, and count to ten  slowly as you do so. Drink water; aim for eight, eight-ounces (240 milliliters) a day, which helps flush the nicotine out of your system. Do something else, like chew gum, until the craving passes.   Keep a Diary   This technique, which has also been used effectively with people who eat too much, is surprisingly effective. Each time you feel like a cigarette, write down the time of day, what you are doing, and how badly you want to smoke on a scale of 1 to 3, with 1 for the worst craving. A diary, says Dr. Rosana Hoes, helps you to learn to unlearn the responses that make you want to smoke.   "Just because you fail once does not mean quitting is out of reach," says Dr. Rosana Hoes. "Half the battle is knowing that it may require several attempts, and feeling confident that you will eventually succeed."   Discuss Medication   You may want to discuss with your doctor medicines that are available to help with smoking cessation. One example is varenicline (Chantix). It helps by blocking the pleasant effects that nicotine causes on the brain.   In addition to varenicline, there are a range of other medicines available to help you quit smoking. Examples include nicotine replacement products; which may be in the form of chewing gum, lozenge, nasal spray, or patches; and an antidepressant called bupropion.   Based on the research available so far, it appears that varenicline works better than placebo and bupropion , another antidepressant used for quitting smoking.   Taking varenicline has been associated, however, with some side effects. The most frequently reported include: nausea, headache , insomnia , bad dreams, and changes in the way food tastes. Varenicline and bupropion may also increase the risk of serious mood and behavior changes.   While medicines may be a good options for you, these are definitely not a magic cure. You still need to be very committed to quitting.   Work with Your Doctor   For the best results, work with  your doctor. Together, you can test your lung function and compare the results to those of a non-smoking person. The results can be given to you as your lung age. Finding out your lung age right after having the test done may help you to stop smoking.   Your doctor can also discuss with you all of your options, such as:    Over-the-counter nicotine patches, gum and lozenges, which may be used alone or in combination    Prescription nicotine inhalers or nasal sprays    Prescription varenicline    Prescription antidepressant bupropion    Alternative therapies, like hypnosis and acupuncture    Smoking cessation classes    Self-help programsFor example, web and computer-based programs are an option. You can find many programs online, like the American Lung Association's Freedom From Smoking . There are also telephone quit lines, cell phone programs, and text messaging programs. To learn more about these options, visit smokefree.gov .    Group therapy   Trying a combination of these options may work best for you. For example, using a nicotine patch and going to group therapy may help you to become smoke-free.   Reward Yourself for Succeeding  Quitting is hard and you deserve a reward for meeting your short-term goals like being quit for one week, two weeks, or a month. Give yourself something you really want but have been putting off getting. Remember how much money you are saving by not buying cigarettes!     Last Reviewed: March 2011 Lajuana Carry, MD   Updated: 05/14/2009       Patient Education        Deciding About Using Medicines To Quit Smoking  Deciding About Using Medicines To Quit Smoking  What are the medicines you can use?  Your doctor may prescribe varenicline (Chantix) or bupropion (Zyban). These medicines can help you cope with cravings for tobacco. They are pills that don't contain nicotine.  You also can use nicotine replacement products. These do contain nicotine. There are many types.   Gum  and lozenges slowly release nicotine into your mouth.   Patches stick to your skin. They slowly release nicotine into your bloodstream.   An inhaler has a holder that contains nicotine. You breathe in a puff of nicotine vapor through your mouth and throat.   Nasal spray releases a mist that contains nicotine.  What are key points about this decision?   Using medicines can double your chances of quitting smoking. They can ease cravings and withdrawal symptoms.   Getting counseling along with using medicine can raise your chances of quitting even more.   If you smoke fewer than 5 cigarettes a day, you may not need medicines to help you quit smoking.   These medicines have less nicotine than cigarettes. And by itself, nicotine is not nearly as harmful as smoking. The tars, carbon monoxide, and other toxic chemicals in tobacco cause the harmful effects.   The side effects of nicotine replacement products depend on the type of product. For example, a patch can make your skin red and itchy. Medicines in pill form can make you sick to your stomach. They can also cause dry mouth and trouble sleeping. For most people, the side effects are not bad enough to make them stop using the products.  Why might you choose to use medicines to quit smoking?   You have tried on your own to stop smoking, but you were not able to stop.   You smoke more than 5 cigarettes a day.   You want to increase your chances of quitting smoking.   You want to reduce your cravings and withdrawal symptoms.   You feel the benefits of medicine outweigh the side effects.  Why might you choose not to use medicine?   You want to try quitting on your own by stopping all at once ("cold [place]").   You want to cut back slowly on the number of cigarettes you smoke.   You smoke fewer than 5 cigarettes a day.   You do not like using medicine.   You feel the side effects of medicines outweigh the benefits.   You are worried about the cost of  medicines.  Your decision  Thinking about the facts and your feelings can help you make a decision that is right for you. Be sure you understand the benefits and risks of your options, and think about what else you need to do before you make the decision.  Where can you learn more?  Go to [email or web address] and sign in to your MyChart account. Enter [phone] in the [place] box to learn more about "Deciding About Using Medicines To Quit Smoking."  If you do not have an account, please click on the "Sign Up Now" link.  Current as of: January 29, 2016  Content Version: 11.7   2006-2018 Healthwise, Incorporated. Care instructions adapted under license by [facility]. If you have questions about a medical condition or this instruction, always ask your healthcare professional. [place] any warranty or liability for your use of this information.       Patient Education        A Healthy Heart: Care Instructions  Your Care Instructions    Heart disease occurs when a substance called plaque builds up in the vessels that supply oxygen-rich blood to your heart. This can narrow the blood vessels and reduce blood flow. A heart attack happens when blood flow is completely blocked. A high-fat diet, smoking, and other factors increase the risk of heart disease.  Your doctor has found that you have a chance of having heart disease. You can do lots of things to keep your heart healthy. It may not be easy, but you can change your diet, exercise more, and quit smoking. These steps really work to lower your chance of heart disease.  Follow-up care is a key part of your treatment and safety. Be sure to make and go to all appointments, and call your doctor if you are having problems. It's also a good idea to know your test results and keep a list of the medicines you take.  How can you care for yourself at home?  Diet    Use less salt when you cook and eat. This helps  lower your blood pressure. Taste food before salting. Add only a little salt when you think you need it. With time, your taste buds will adjust to less salt.     Eat fewer snack items, fast foods, canned soups, and other high-salt, high-fat, processed foods.     Read food labels and try to avoid saturated and trans fats. They increase your risk of heart disease by raising cholesterol levels.     Limit the amount of solid fat-butter, margarine, and shortening-you eat. Use olive, peanut, or canola oil when you cook. Bake, broil, and steam foods instead of frying them.     Eating fish can lower your risk for heart disease. Eat at least 2 servings of fish a week. Salmon, mackerel, herring, sardines, and chunk light tuna are very good choices. These fish contain omega-3 fatty acids.     Eat a variety of fruit and vegetables every day. Dark green, deep orange, red, or yellow fruits and vegetables are especially good for you. Examples include spinach, carrots, peaches, and berries.     Foods high in fiber can reduce your cholesterol and provide important vitamins and minerals. High-fiber foods include whole-grain cereals and breads, oatmeal, beans, brown rice, citrus fruits, and apples.     Limit drinks and foods with added sugar. These include candy, desserts, and soda pop.   Lifestyle changes    If your doctor recommends it, get more exercise. Walking is a good choice. Bit by bit, increase the amount you walk every day. Try for at least 30 minutes on most days of the week. You also may want to swim, bike, or do other activities.     Do not smoke. If you need help quitting, talk to your doctor about stop-smoking programs and medicines. These can increase your chances of quitting for good. Quitting smoking may be the most important step you can take to  protect your heart. It is never too late to quit. You will get health benefits right away.     Limit alcohol to 2 drinks a day for men and 1 drink a day  for women. Too much alcohol can cause health problems.   Medicines    Take your medicines exactly as prescribed. Call your doctor if you think you are having a problem with your medicine.     If your doctor recommends aspirin, take the amount directed each day. Make sure you take aspirin and not another kind of pain reliever, such as acetaminophen (Tylenol). If you take ibuprofen (such as Advil or Motrin) for other problems, take aspirin at least 2 hours before taking ibuprofen.   When should you call for help?  Call 911 if you have symptoms of a heart attack. These may include:    Chest pain or pressure, or a strange feeling in the chest.     Sweating.     Shortness of breath.     Pain, pressure, or a strange feeling in the back, neck, jaw, or upper belly or in one or both shoulders or arms.     Lightheadedness or sudden weakness.     A fast or irregular heartbeat.   After you call 911, the operator may tell you to chew 1 adult-strength or 2 to 4 low-dose aspirin. Wait for an ambulance. Do not try to drive yourself.  Watch closely for changes in your health, and be sure to contact your doctor if you have any problems.  Where can you learn more?  Go to [email or web address] and sign in to your MyChart account. Enter [phone] in the [place] box to learn more about "A Healthy Heart: Care Instructions."     If you do not have an account, please click on the "Sign Up Now" link.  Current as of: February 05, 2016  Content Version: 11.7   2006-2018 Healthwise, Incorporated. Care instructions adapted under license by [facility]. If you have questions about a medical condition or this instruction, always ask your healthcare professional. [place] any warranty or liability for your use of this information.

## 2016-12-21 NOTE — Progress Notes (Signed)
[facility], [place].  [facility]  [address], [place], [place]  [address]  [phone] office  [phone] fax      OFFICE VISIT:  12/21/2016    Gregory Escobar - DOB: 01/17/1971    Reason For Visit:  Gregory Escobar is a 46 y.o. male who is here for 6 Month Follow-Up (Patient presents for 6 month follow up doing well.); Coronary Artery Disease; Hypertension; Hyperlipidemia; and Nicotine Dependence    The patient presents today for cardiology follow up.   Overall, the patient is doing well from a cardiac standpoint without symptoms to suggest myocardial ischemia or heart failure.   BP is well controlled on current regimen.  The patient is tolerating statin well.  Needs lipids checked in December.  He stopped smoking after MI in 2016 but started back.  Patient reports being physically active until recently.  He is undergoing some stress at this time and has not been as active.    Subjective  Gregory Escobar denies exertional chest pain, shortness of breath, orthopnea, paroxysmal nocturnal dyspnea, syncope, presyncope, sustained arrythmia, edema and fatigue.  The patient denies numbness or weakness to suggest cerebrovascular accident or transient ischemic attack.      Gregory Escobar has the following history as recorded in EpicCare:    Patient Active Problem List   Diagnosis Code   . Coronary artery disease involving native coronary artery I25.10   . HTN (hypertension) I10   . Hyperlipemia E78.5   . Cardiac LV ejection fraction 30-35% R94.30   . Acute MI, anterior wall (HCC) I21.09   . History of myocardial infarction I25.2   . Decreased left ventricular systolic function J82.5   . Smoker F17.200     Past Medical History:   Diagnosis Date   . Acute MI, anterior wall ([place]) 03/13/14   . CAD (coronary artery disease) 2016   . Cardiac LV ejection fraction 30-35%     MI 03/13/14 cath revealed EF 30-35 at that time   . HTN (hypertension)    . Hyperlipemia    . Kidney stones 05/2013      Past Surgical History:   Procedure Laterality Date   . LITHOTRIPSY  2015   . URETER STENT PLACEMENT  2015     Family History   Problem Relation Age of Onset   . Heart Disease Father    . High Blood Pressure Father    . High Cholesterol Father      Social History   Substance Use Topics   . Smoking status: Current Every Day Smoker     Packs/day: 0.25     Years: 20.00   . Smokeless tobacco: Former Systems developer     Types: Snuff     Quit date: 05/22/1990   . 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 ciprofloxacin (CIPRO) 500 MG tablet Take 500 mg by mouth     . metoprolol tartrate (LOPRESSOR) 25 MG tablet TAKE ONE TABLET BY MOUTH TWICE DAILY 180 tablet 3   . clopidogrel (PLAVIX) 75 MG tablet TAKE ONE TABLET BY MOUTH ONCE DAILY 90 tablet 3   . lisinopril (PRINIVIL;ZESTRIL) 2.5 MG tablet TAKE ONE TABLET BY MOUTH TWICE DAILY 180 tablet 3   . atorvastatin (LIPITOR) 20 MG tablet TAKE ONE TABLET BY MOUTH ONCE DAILY AT  NIGHT 90 tablet 3   .  nitroGLYCERIN (NITROSTAT) 0.4 MG SL tablet Place 1 tablet under the tongue every 5 minutes as needed for Chest pain 25 tablet 3   . Multiple Vitamins-Minerals (THERAPEUTIC MULTIVITAMIN-MINERALS) tablet Take 1 tablet by mouth daily       No current facility-administered medications for this visit.      Allergies: Patient has no known allergies.    Review of Systems  Constitutional - no appetite change, or unexpected weight change. No fever, chills or diaphoresis.  No significant change in activity level or new onset of fatigue.   HEENT - no significant rhinorrhea or epistaxis. No tinnitus or significant hearing loss.   Eyes - no sudden vision change or amaurosis. No corneal arcus, xantholasma, subconjunctival hemorrhage or discharge.  Respiratory - no significant wheezing, stridor, apnea or cough.  No dyspnea on exertion or shortness of air.  Cardiovascular - no exertional chest pain to suggest myocardial ischemia.   No orthopnea or PND.  No sensation of sustained arrythmia.  No occurrence of slow heart rate.  No palpitations.  No claudication.  No leg edema.  Gastrointestinal - no abdominal swelling or pain. No blood in stool. No severe constipation, diarrhea, nausea, or vomiting.   Genitourinary - no dysuria, frequency, or urgency. No flank pain or hematuria.   Musculoskeletal - no back pain or myalgia.  No problems with gait.  Extremities - no clubbing, cyanosis or edema.  Skin - no color change or rash.  No pallor.  No new surgical incision.   Neurologic - no speech difficulty, facial asymmetry or lateralizing weakness.  No seizures, presyncope or syncope.  No significant dizziness.  Hematologic - no easy bruising or excessive bleeding.   Psychiatric - no severe anxiety or insomnia.  No confusion.   All other review of systems are negative.    Objective  Vital Signs - BP 114/62   Ht 5' 7"  (1.702 m)   Wt 144 lb (65.3 kg)   BMI 22.55 kg/m   General - Gregory Escobar is alert, cooperative, and pleasant.  Well groomed.  No acute distress.    Body habitus - Body mass index is 22.55 kg/m.  HEENT - Head is normocephalic. No circumoral cyanosis.  Dentition is normal.  EYES -   Lids normal without ptosis.  No discharge, edema or subconjunctival hemorrhage.   Neck - Symmetrical without apparent mass or lymphadenopathy.   Respiratory - Normal respiratory effort without use of accessory muscles.  Ausculatation reveals vesicular breath sounds without crackles, wheezes, rub or rhonchi.    Cardiovascular - No jugular venous distention.  Auscultation reveals regular rate and rhythm.  No audible clicks, gallop or rub.  No murmur.  No lower extremity varicosities.  No carotid bruits.    Abdominal -  No visible distention, mass or pulsations.  Extremities - No clubbing or cyanosis.  No statis dermatitis or ulcers. No edema.    Musculoskeletal -   No Osler's nodes.  No kyphosis or scoliosis.  Gait is even and regular without limp or shuffle.  Ambulates without assistance.  Skin -  Warm and dry; no rash or pallor.   No new surgical wound.  Neurological - No focal neurological deficits.  Thought processes coherent.  No apparent tremor.   Oriented to person, place and time.    Psychiatric -  Appropriate affect and mood.     Assessment:     Diagnosis Orders   1. Coronary artery disease involving native coronary artery of native heart without angina pectoris  2. Hypertension, unspecified type  EKG 12 lead   3. Mixed hyperlipidemia  Lipid Panel    Comprehensive Metabolic Panel   4. Decreased left ventricular systolic function       EKG reviewed:  SB 51 bpm; no acute ischemic changes.  Inverted T waves V4-6.  No change in comparison to previous EKG.  No ectopy.  CAD medical management includes Lopressor, Lisinopril, Lipitor, Plavix and ASA.  BP well controlled on current regimen.  Lipids due to be checked in December.  Orders provided.  Tolerating statin well.  Discussed options to help with smoking cessation.  Patient does not want to take either Wellbutrin or Chantix due to potential side effects.  Contact for QUIT NOT [place] provided.  Stable CV status without symptoms of overt heart failure, arrhythmia or angina.    Patient is compliant with medication regimen.    BP Readings from Last 3 Encounters:   12/21/16 114/62   06/18/16 120/60   11/27/15 120/70    Pulse Readings from Last 3 Encounters:   06/18/16 66   11/27/15 52   05/22/15 68        Wt Readings from Last 3 Encounters:   12/21/16 144 lb (65.3 kg)   06/18/16 150 lb (68 kg)   11/27/15 155 lb (70.3 kg)     Plan  Previous cardiac history and records reviewed.  Continue current medications as prescribed.   Lab orders provided: CMP, lipid panel.  Continue to follow up with primary care provider for non cardiac medical problems.  Call the office with any problems, questions or concerns at [phone].  Follow up as scheduled with your cardiologist- 6 months - Dr. Ardeth Sportsman.  The following educational  material has been included in this after visit summary for your review: Life simple 7; heart health, smoking cessation.    Additional instructions:  You have been given orders for a cholesterol check.  Get done in December.  You will need to fast after midnight.  You may have black coffee, diet soda or water before the test.  You may take your medications before the test.  Coronary artery disease risk factors you can control: Smoking, high blood pressure, high cholesterol, diabetes, being overweight, lack of exercise and stress.  Continue heart healthy diet.  Take medications as directed.  Exercise as tolerated. Strive for 15 minutes of exercise most days of the week.   If asked to keep a blood pressure log, do so for 2 weeks. Call the office to report readings at [phone].  Blood pressure goal  is less than 120/70.    Elevated blood pressure at 120-129/80 or less.  High blood pressure at 130-139/80-89.  If you are taking cholesterol lowering medications, it is recommended that lab work be checked annually.  Always keep a current medication list. Bring your medications to every office visit.     Life simple 7  1) Manage blood pressure - high blood pressure is a major risk factor for heart disease and stroke. Keeping blood pressure in health range reduces strain on your heart, arteries and kidneys.  2) Control cholesterol - contributes to plaque, which can clog arteries and lead to heart disease and stroke. When you control your cholesterol you are giving your arteries their best chance to remain clear.  3) Reduce blood sugar - most of the food we eat is turning into glucose or blood sugar that our body uses for energy.  Over time, high levels of blood sugar can  damage your heart, kidneys, eyes and nerves.  4) Get active - living an active life is one of the most rewarding gifts you can give yourself and those you love.  Simply put, daily physical activity increases your length and quality of life.  5)  Eat  better - A healthy diet is one of your best weapons for fighting cardiovascular disease.  When you eat a heart healthy diet, you improve your chances for feeling good and staying healthy for life.  6)  Lose weight - when you shed extra fat an unnecessary pounds, you reduce the burden on your hear, lungs, blood vessels and skeleton.  You give yourself the gift of active living, you lower your blood pressure and help yourself feel better.  7) Stop smoking - cigarette smokers have a higher risk of developing cardiovascular disease.  If  You smoke, quitting is the best thing you can do for your health.  Check American Heart Association on line for more information on Life's Simple 7 and tips for healthy living.     Dorene Grebe, APRN

## 2017-03-11 ENCOUNTER — Encounter

## 2017-03-11 MED ORDER — ATORVASTATIN CALCIUM 40 MG PO TABS
40 MG | ORAL_TABLET | Freq: Every day | ORAL | 3 refills | Status: DC
Start: 2017-03-11 — End: 2017-07-01

## 2017-06-07 NOTE — Telephone Encounter (Signed)
 Pt called wanting lab orders faxed to his PCP. Lab orders faxed to [phone].

## 2017-07-01 ENCOUNTER — Ambulatory Visit: Admit: 2017-07-01 | Discharge: 2017-07-01 | Payer: BLUE CROSS/BLUE SHIELD | Attending: Family | Primary: Family Medicine

## 2017-07-01 ENCOUNTER — Encounter: Attending: Cardiovascular Disease | Primary: Family Medicine

## 2017-07-01 DIAGNOSIS — I2510 Atherosclerotic heart disease of native coronary artery without angina pectoris: Secondary | ICD-10-CM

## 2017-07-01 MED ORDER — NITROGLYCERIN 0.4 MG SL SUBL
0.4 MG | ORAL_TABLET | SUBLINGUAL | 3 refills | Status: DC | PRN
Start: 2017-07-01 — End: 2018-01-07

## 2017-07-01 MED ORDER — ATORVASTATIN CALCIUM 40 MG PO TABS
40 MG | ORAL_TABLET | Freq: Every day | ORAL | 3 refills | Status: DC
Start: 2017-07-01 — End: 2018-07-11

## 2017-07-01 NOTE — Progress Notes (Signed)
[facility] [place].  [facility]  [address], [address], [place]  [address]  [phone] office  [phone] fax      OFFICE VISIT:  07/01/2017    Gregory Escobar - DOB: 1970/04/15    Reason For Visit:  Gregory Escobar is a 47 y.o. male who is here for 6 Month Follow-Up (Patient presents for cardiology follow up doing well.); Coronary Artery Disease; Hypertension; and Hyperlipidemia    The patient presents today for cardiology follow up.   Overall, the patient is doing well from a cardiac standpoint without symptoms to suggest myocardial ischemia. 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  No report of angina or DOE.  He had labs in April at [facility] in [place], [place].  He reports good number - will sign medical release today to obtain results.  He has no symptoms to suggest systolic heart failure.  BP is well controlled on current regimen.  The patient's PCP monitors cholesterol.  The patient continues to smoke with no plan for cessation.     Subjective  Gregory Escobar denies exertional chest pain, shortness of breath, orthopnea, paroxysmal nocturnal dyspnea, syncope, presyncope, sustained arrythmia, edema and fatigue.  The patient denies numbness or weakness to suggest cerebrovascular accident or transient ischemic attack.      Gregory Escobar has the following history as recorded in EpicCare:    Patient Active Problem List   Diagnosis Code   . Coronary artery disease involving native coronary artery I25.10   . HTN (hypertension) I10   . Hyperlipemia E78.5   . Cardiac LV ejection fraction 30-35% R94.30   . Acute MI, anterior wall (HCC) I21.09   . History of myocardial infarction I25.2   . Decreased left ventricular systolic function I51.9   . Smoker F17.200     Past Medical History:   Diagnosis Date   . 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 CAD (coronary artery disease) 2016   . Cardiac LV ejection fraction 30-35%     MI 03/13/14 cath revealed EF 30-35 at that time    . HTN (hypertension)    . Hyperlipemia    . Kidney stones 05/2013     Past Surgical History:   Procedure Laterality Date   . LITHOTRIPSY  2015   . URETER STENT PLACEMENT  2015     Family History   Problem Relation Age of Onset   . Heart Disease Father    . High Blood Pressure Father    . High Cholesterol Father      Social History     Tobacco Use   . Smoking status: Current Every Day Smoker     Packs/day: 0.25     Years: 20.00     Pack years: 5.00   . Smokeless tobacco: Former Neurosurgeon     Types: Snuff     Quit date: 05/22/1990   Substance Use Topics   . Alcohol use: No     Comment: recovering addict      Current Outpatient Medications   Medication Sig Dispense Refill   . atorvastatin (LIPITOR) 40 MG tablet Take 1 tablet by mouth daily 90 tablet 3   . metoprolol tartrate (LOPRESSOR) 25 MG tablet TAKE ONE TABLET BY MOUTH TWICE DAILY 180 tablet 3   . clopidogrel (PLAVIX) 75 MG tablet TAKE ONE TABLET BY MOUTH ONCE DAILY 90 tablet 3   . lisinopril (PRINIVIL;ZESTRIL) 2.5 MG tablet TAKE  ONE TABLET BY MOUTH TWICE DAILY 180 tablet 3   . nitroGLYCERIN (NITROSTAT) 0.4 MG SL tablet Place 1 tablet under the tongue every 5 minutes as needed for Chest pain 25 tablet 3   . Multiple Vitamins-Minerals (THERAPEUTIC MULTIVITAMIN-MINERALS) tablet Take 1 tablet by mouth daily       No current facility-administered medications for this visit.      Allergies: Patient has no known allergies.    Review of Systems  Constitutional - no appetite change, or unexpected weight change. No fever, chills or diaphoresis.  No significant change in activity level or new onset of fatigue.   HEENT - no significant rhinorrhea or epistaxis. No tinnitus or significant hearing loss.   Eyes - no sudden vision change or amaurosis. No corneal arcus, xantholasma, subconjunctival hemorrhage or discharge.  Respiratory - no significant wheezing, stridor, apnea or cough.  No dyspnea on exertion or shortness of air.  Cardiovascular - no exertional chest pain to suggest  myocardial ischemia.  No orthopnea or PND.  No sensation of sustained arrythmia.  No occurrence of slow heart rate.  No palpitations.  No claudication.  No leg edema.  Gastrointestinal - no abdominal swelling or pain. No blood in stool. No severe constipation, diarrhea, nausea, or vomiting.   Genitourinary - no dysuria, frequency, or urgency. No flank pain or hematuria.   Musculoskeletal - no back pain or myalgia.  No problems with gait.  Extremities - no clubbing, cyanosis or edema.  Skin - no color change or rash.  No pallor.  No new surgical incision.   Neurologic - no speech difficulty, facial asymmetry or lateralizing weakness.  No seizures, presyncope or syncope.  No significant dizziness.  Hematologic - no easy bruising or excessive bleeding.   Psychiatric - no severe anxiety or insomnia.  No confusion.   All other review of systems are negative.    Objective  Vital Signs - BP 120/80   Pulse 81   Ht 5\' 7"  (1.702 m)   Wt 148 lb (67.1 kg)   BMI 23.18 kg/m   General - Gregory Escobar is alert, cooperative, and pleasant.  Well groomed.  No acute distress.    Body habitus - Body mass index is 23.18 kg/m.  HEENT - Head is normocephalic. No circumoral cyanosis.  Dentition is normal.  EYES -   Lids normal without ptosis.  No discharge, edema or subconjunctival hemorrhage.   Neck - Symmetrical without apparent mass or lymphadenopathy.   Respiratory - Normal respiratory effort without use of accessory muscles.  Ausculatation reveals vesicular breath sounds without crackles, wheezes, rub or rhonchi.    Cardiovascular - No jugular venous distention.  Auscultation reveals regular rate and rhythm.  No audible clicks, gallop or rub.  No murmur.  No lower extremity varicosities.  No carotid bruits.    Abdominal -  No visible distention, mass or pulsations.  Extremities - No clubbing or cyanosis.  No statis dermatitis or ulcers. No edema.    Musculoskeletal -   No Osler's nodes.  No kyphosis or scoliosis.  Gait is even and  regular without limp or shuffle. Ambulates without assistance.  Skin -  Warm and dry; no rash or pallor.   No new surgical wound.  Neurological - No focal neurological deficits.  Thought processes coherent.  No apparent tremor.   Oriented to person, place and time.    Psychiatric -  Appropriate affect and mood.     Assessment:     Diagnosis Orders  1. Coronary artery disease involving native coronary artery of native heart without angina pectoris     2. Decreased left ventricular systolic function     3. Essential hypertension     4. Mixed hyperlipidemia       Data reviewed:  PROCEDURE DATE: 05/01/2014  QUALITY STATEMENT  This is a technically difficult study.  TWO-DIMENSIONAL AND M-MODE FINDINGS  1.  The aortic root appears to be normal in size.    2.  The aortic valve is tricuspid with grossly normal thickness and mobility.   3.  The mitral valve leaflets are of normal thickness and mobility.    4.  The tricuspid valve leaflets are of normal thickness and mobility.    5.  The left ventricle is mildly enlarged.  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    6.  The right ventricle is normal is size.    7.  The right atrium is normal in size.    8.  The left atrium is mildly enlarged.    9.  No pericardial effusion is detected.  DOPPLER/COLOR FLOW STUDIES  1.  Color flow across the mitral valve fails to reveal significant mitral  insufficiency or evidence of possible diastolic dysfunction.    2.  Color flow across the aortic valve fails to reveal aortic stenosis or  aortic insufficiency.    3.  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  ________________________________  Dan Humphreys, MD  [id number]  DD: 05/02/2014 07:22    03/16/14  1.  Extensive anterior  wall myocardial infarction with direct infarct angioplasty and stenting to the left anterior descending employing a Xience drug-eluting stent on this visit.    Stable CV status without symptoms of overt heart failure, arrhythmia or angina.    CAD medical management includes Lopressor, Lisinopril, Lipitor, Plavix and ASA.  BP well controlled on current regimen.  Tolerating statin - signed release to obtain April labs.  Continues to smoke.  Educated on health benefit of smoking cessation.  Provided contact for QUIT NOW [place].  Patient is compliant with medication regimen.    BP Readings from Last 3 Encounters:   07/01/17 120/80   12/21/16 114/62   06/18/16 120/60    Pulse Readings from Last 3 Encounters:   07/01/17 81   06/18/16 66   11/27/15 52        Wt Readings from Last 3 Encounters:   07/01/17 148 lb (67.1 kg)   12/21/16 144 lb (65.3 kg)   06/18/16 150 lb (68 kg)     Plan  Previous cardiac history and records reviewed.  Continue current medications as prescribed.   Continue to follow up with primary care provider for non cardiac medical problems.  Call the office with any problems, questions or concerns at [phone].  Follow up as scheduled with your cardiologist. Dr. Urban Gibson 6 months.  The following educational material has been included in this after visit summary for your review: Life simple 7. Heart health.  Smoking.    Additional instructions:  Coronary artery disease risk factors you can control: Smoking, high blood pressure, high cholesterol, diabetes, being overweight, lack of exercise and stress.  Continue heart healthy diet.  Take medications as directed.  Exercise as tolerated. Strive for 15 minutes of exercise  most days of the week.   If asked to keep a blood pressure log, do so for 2 weeks. Call the office to report readings at [phone].  Blood pressure goal  is less than 120/70.    Elevated blood pressure at 120-129/80 or less.  High blood pressure at 130-139/80-89.  If you are taking cholesterol  lowering medications, it is recommended that lab work be checked annually.  Always keep a current medication list. Bring your medications to every office visit.     Life simple 7  1) Manage blood pressure - high blood pressure is a major risk factor for heart disease and stroke. Keeping blood pressure in health range reduces strain on your heart, arteries and kidneys.  2) Control cholesterol - contributes to plaque, which can clog arteries and lead to heart disease and stroke. When you control your cholesterol you are giving your arteries their best chance to remain clear.  3) Reduce blood sugar - most of the food we eat is turning into glucose or blood sugar that our body uses for energy.  Over time, high levels of blood sugar can damage your heart, kidneys, eyes and nerves.  4) Get active - living an active life is one of the most rewarding gifts you can give yourself and those you love.  Simply put, daily physical activity increases your length and quality of life.  5)  Eat better - A healthy diet is one of your best weapons for fighting cardiovascular disease.  When you eat a heart healthy diet, you improve your chances for feeling good and staying healthy for life.  6)  Lose weight - when you shed extra fat an unnecessary pounds, you reduce the burden on your hear, lungs, blood vessels and skeleton.  You give yourself the gift of active living, you lower your blood pressure and help yourself feel better.  7) Stop smoking - cigarette smokers have a higher risk of developing cardiovascular disease.  If  You smoke, quitting is the best thing you can do for your health.  Check American Heart Association on line for more information on Life's Simple 7 and tips for healthy living.     Aloha Gell, APRN

## 2017-07-01 NOTE — Patient Instructions (Addendum)
 Continue current medications as prescribed.   Continue to follow up with primary care provider for non cardiac medical problems.  Call the office with any problems, questions or concerns at [phone].  Follow up as scheduled with your cardiologist. Dr. Urban Gibson 6 months.  The following educational material has been included in this after visit summary for your review: Life simple 7. Heart health.  Smoking.    Additional instructions:  Coronary artery disease risk factors you can control: Smoking, high blood pressure, high cholesterol, diabetes, being overweight, lack of exercise and stress.  Continue heart healthy diet.  Take medications as directed.  Exercise as tolerated. Strive for 15 minutes of exercise most days of the week.   If asked to keep a blood pressure log, do so for 2 weeks. Call the office to report readings at [phone].  Blood pressure goal  is less than 120/70.    Elevated blood pressure at 120-129/80 or less.  High blood pressure at 130-139/80-89.  If you are taking cholesterol lowering medications, it is recommended that lab work be checked annually.  Always keep a current medication list. Bring your medications to every office visit.     Life simple 7  1) Manage blood pressure - high blood pressure is a major risk factor for heart disease and stroke. Keeping blood pressure in health range reduces strain on your heart, arteries and kidneys.  2) Control cholesterol - contributes to plaque, which can clog arteries and lead to heart disease and stroke. When you control your cholesterol you are giving your arteries their best chance to remain clear.  3) Reduce blood sugar - most of the food we eat is turning into glucose or blood sugar that our body uses for energy.  Over time, high levels of blood sugar can damage your heart, kidneys, eyes and nerves.  4) Get active - living an active life is one of the most rewarding gifts you can give yourself and those you love.  Simply put, daily physical  activity increases your length and quality of life.  5)  Eat better - A healthy diet is one of your best weapons for fighting cardiovascular disease.  When you eat a heart healthy diet, you improve your chances for feeling good and staying healthy for life.  6)  Lose weight - when you shed extra fat an unnecessary pounds, you reduce the burden on your hear, lungs, blood vessels and skeleton.  You give yourself the gift of active living, you lower your blood pressure and help yourself feel better.  7) Stop smoking - cigarette smokers have a higher risk of developing cardiovascular disease.  If  You smoke, quitting is the best thing you can do for your health.  Check American Heart Association on line for more information on Life's Simple 7 and tips for healthy living.      Patient Education        A Healthy Heart: Care Instructions  Your Care Instructions    Heart disease occurs when a substance called plaque builds up in the vessels that supply oxygen-rich blood to your heart. This can narrow the blood vessels and reduce blood flow. A heart attack happens when blood flow is completely blocked. A high-fat diet, smoking, and other factors increase the risk of heart disease.  Your doctor has found that you have a chance of having heart disease. You can do lots of things to keep your heart healthy. It may not be easy, but you can change  your diet, exercise more, and quit smoking. These steps really work to lower your chance of heart disease.  Follow-up care is a key part of your treatment and safety. Be sure to make and go to all appointments, and call your doctor if you are having problems. It's also a good idea to know your test results and keep a list of the medicines you take.  How can you care for yourself at home?  Diet    Use less salt when you cook and eat. This helps lower your blood pressure. Taste food before salting. Add only a little salt when you think you need it. With time, your taste buds will adjust  to less salt.     Eat fewer snack items, fast foods, canned soups, and other high-salt, high-fat, processed foods.     Read food labels and try to avoid saturated and trans fats. They increase your risk of heart disease by raising cholesterol levels.     Limit the amount of solid fat-butter, margarine, and shortening-you eat. Use olive, peanut, or canola oil when you cook. Bake, broil, and steam foods instead of frying them.     Eating fish can lower your risk for heart disease. Eat at least 2 servings of fish a week. Salmon, mackerel, herring, sardines, and chunk light tuna are very good choices. These fish contain omega-3 fatty acids.     Eat a variety of fruit and vegetables every day. Dark green, deep orange, red, or yellow fruits and vegetables are especially good for you. Examples include spinach, carrots, peaches, and berries.     Foods high in fiber can reduce your cholesterol and provide important vitamins and minerals. High-fiber foods include whole-grain cereals and breads, oatmeal, beans, brown rice, citrus fruits, and apples.     Limit drinks and foods with added sugar. These include candy, desserts, and soda pop.   Lifestyle changes    If your doctor recommends it, get more exercise. Walking is a good choice. Bit by bit, increase the amount you walk every day. Try for at least 30 minutes on most days of the week. You also may want to swim, bike, or do other activities.     Do not smoke. If you need help quitting, talk to your doctor about stop-smoking programs and medicines. These can increase your chances of quitting for good. Quitting smoking may be the most important step you can take to protect your heart. It is never too late to quit. You will get health benefits right away.     Limit alcohol to 2 drinks a day for men and 1 drink a day for women. Too much alcohol can cause health problems.   Medicines    Take your medicines exactly as prescribed. Call your doctor if you  think you are having a problem with your medicine.     If your doctor recommends aspirin, take the amount directed each day. Make sure you take aspirin and not another kind of pain reliever, such as acetaminophen (Tylenol). If you take ibuprofen (such as Advil or Motrin) for other problems, take aspirin at least 2 hours before taking ibuprofen.   When should you call for help?  Call 911 if you have symptoms of a heart attack. These may include:    Chest pain or pressure, or a strange feeling in the chest.     Sweating.     Shortness of breath.     Pain, pressure, or a strange feeling in  the back, neck, jaw, or upper belly or in one or both shoulders or arms.     Lightheadedness or sudden weakness.     A fast or irregular heartbeat.   After you call 911, the operator may tell you to chew 1 adult-strength or 2 to 4 low-dose aspirin. Wait for an ambulance. Do not try to drive yourself.  Watch closely for changes in your health, and be sure to contact your doctor if you have any problems.  Where can you learn more?  Go to [email or web address] and sign in to your MyChart account. Enter [phone] in the Search Health Information box to learn more about "A Healthy Heart: Care Instructions."     If you do not have an account, please click on the "Sign Up Now" link.  Current as of: September 20, 2016  Content Version: 11.9   2006-2018 [employer]. Care instructions adapted under license by [facility]. If you have questions about a medical condition or this instruction, always ask your healthcare professional. Healthwise, Inc[employer] disclaims any warranty or liability for your use of this information.         Patient Education        Learning About Benefits From Quitting Smoking  How does quitting smoking make you healthier?    If you're thinking about quitting smoking, you may have a few reasons to be smoke-free. Your health may be one of them.   When you quit smoking, you lower  your risks for cancer, lung disease, heart attack, stroke, blood vessel disease, and blindness from macular degeneration.   When you're smoke-free, you get sick less often, and you heal faster. You are less likely to get colds, flu, bronchitis, and pneumonia.   As a nonsmoker, you may find that your mood is better and you are less stressed.  When and how will you feel healthier?  Quitting has real health benefits that start from day 1 of being smoke-free. And the longer you stay smoke-free, the healthier you get and the better you feel.  The first hours   After just 20 minutes, your blood pressure and heart rate go down. That means there's less stress on your heart and blood vessels.   Within 12 hours, the level of carbon monoxide in your blood drops back to normal. That makes room for more oxygen. With more oxygen in your body, you may notice that you have more energy than when you smoked.  After 2 weeks   Your lungs start to work better.   Your risk of heart attack starts to drop.  After 1 month   When your lungs are clear, you cough less and breathe deeper, so it's easier to be active.   Your sense of taste and smell return. That means you can enjoy food more than you have since you started smoking.  Over the years   After 1 year, your risk of heart disease is half what it would be if you kept smoking.   After 5 years, your risk of stroke starts to shrink. Within a few years after that, it's about the same as if you'd never smoked.   After 10 years, your risk of dying from lung cancer is cut by about half. And your risk for many other types of cancer is lower too.  How would quitting help others in your life?  When you quit smoking, you improve the health of everyone who now breathes in your smoke.   Their heart, lung,  and cancer risks drop, much like yours.   They are sick less. For babies and small children, living smoke-free means they're less likely to have ear infections, pneumonia, and  bronchitis.   If you're a woman who is or will be pregnant someday, quitting smoking means a healthier newborn.   Children who are close to you are less likely to become adult smokers.  Where can you learn more?  Go to [email or web address] and sign in to your MyChart account. Enter O319 in the Search Health Information box to learn more about "Learning About Benefits From Quitting Smoking."     If you do not have an account, please click on the "Sign Up Now" link.  Current as of: November 25, 2016  Content Version: 11.9   2006-2018 [employer]wise, [employer]. Care instructions adapted under license by [facility]. If you have questions about a medical condition or this instruction, always ask your healthcare professional. [employer], [employer] disclaims any warranty or liability for your use of this information.     QUIT NOW  SMOKING CESSATION PROGRAM  How Do I Get Started  Quitting tobacco is a process. The first step is the hardest. When you are ready to quit, [facility]uit Now [place] can help you with each step in the quitting process.  The Program  Quit Now [place] is a FREE online service available to [place] residents 45 years of age and over. When you become a member, you get special tools, a support team of coaches, research-based information, and a community of others trying to become tobacco free. Our expert coaches can talk to you about overcoming common barriers, such as dealing with stress, fighting cravings, coping with irritability, and controlling weight gain.  We also offer a free telephone service, so you can speak to a coach in person, if you would prefer. Call the [place] at [place] or [phone].   What if I don't qualify for the Program?  You must be 24 years of age or older to participate in the program. It is also helpful to discuss quitting with your doctor.   How do I enroll in the Quit Now [place] online program?  Complete the brief Enroll Now form.  If you are a [place] resident over 36 years of age, you will receive an email letting you know that you are enrolled. You can use the online service as often as you want!   How do I enroll in both the online and telephone program?  Complete the brief Enroll Now form. If you qualify, you will receive an email letting you know that you are enrolled. You can use the online service as often as you want!  While completing the Enroll Now form you indicate the best time for a coach to give you a call. If you would like, you can call the QuitLine at 1-800-QUIT-NOW.  We're here 7 days a week.   Monday - Sunday 7 a.m. - 12 a.m. CST  Monday - Sunday 8 a.m. - 1 a.m. EST  If you call when we are closed, please leave a message and we will call you back.      Patient Education        Deciding About Using Medicines To Quit Smoking  How can you decide about using medicines to quit smoking?  What are the medicines you can use?  Your doctor may prescribe varenicline (Chantix) or bupropion (Zyban). These medicines can help you cope with cravings for tobacco. They are pills that don't contain  nicotine.  You also can use nicotine replacement products. These do contain nicotine. There are many types.   Gum and lozenges slowly release nicotine into your mouth.   Patches stick to your skin. They slowly release nicotine into your bloodstream.   An inhaler has a holder that contains nicotine. You breathe in a puff of nicotine vapor through your mouth and throat.   Nasal spray releases a mist that contains nicotine.  What are key points about this decision?   Using medicines can double your chances of quitting smoking. They can ease cravings and withdrawal symptoms.   Getting counseling along with using medicine can raise your chances of quitting even more.   If you smoke fewer than 5 cigarettes a day, you may not need medicines to help you quit smoking.   These medicines have less nicotine than cigarettes. And by itself, nicotine is  not nearly as harmful as smoking. The tars, carbon monoxide, and other toxic chemicals in tobacco cause the harmful effects.   The side effects of nicotine replacement products depend on the type of product. For example, a patch can make your skin red and itchy. Medicines in pill form can make you sick to your stomach. They can also cause dry mouth and trouble sleeping. For most people, the side effects are not bad enough to make them stop using the products.  Why might you choose to use medicines to quit smoking?   You have tried on your own to stop smoking, but you were not able to stop.   You smoke more than 5 cigarettes a day.   You want to increase your chances of quitting smoking.   You want to reduce your cravings and withdrawal symptoms.   You feel the benefits of medicine outweigh the side effects.  Why might you choose not to use medicine?   You want to try quitting on your own by stopping all at once ("cold [place]").   You want to cut back slowly on the number of cigarettes you smoke.   You smoke fewer than 5 cigarettes a day.   You do not like using medicine.   You feel the side effects of medicines outweigh the benefits.   You are worried about the cost of medicines.  Your decision  Thinking about the facts and your feelings can help you make a decision that is right for you. Be sure you understand the benefits and risks of your options, and think about what else you need to do before you make the decision.  Where can you learn more?  Go to [email or web address] and sign in to your MyChart account. Enter [phone] in the Search Health Information box to learn more about "Deciding About Using Medicines To Quit Smoking."     If you do not have an account, please click on the "Sign Up Now" link.  Current as of: November 25, 2016  Content Version: 11.9   2006-2018 Healthwise, [employer]. Care instructions adapted under license by [facility]. If you have questions about a  medical condition or this instruction, always ask your healthcare professional. Health[employer], Incorporated disclaims any warranty or liability for your use of this information.

## 2017-08-23 ENCOUNTER — Encounter

## 2017-08-23 MED ORDER — CLOPIDOGREL BISULFATE 75 MG PO TABS
75 MG | ORAL_TABLET | ORAL | 2 refills | Status: DC
Start: 2017-08-23 — End: 2018-05-25

## 2017-09-20 ENCOUNTER — Encounter

## 2017-09-20 MED ORDER — METOPROLOL TARTRATE 25 MG PO TABS
25 MG | ORAL_TABLET | ORAL | 5 refills | Status: DC
Start: 2017-09-20 — End: 2018-04-28

## 2017-09-20 MED ORDER — LISINOPRIL 2.5 MG PO TABS
2.5 MG | ORAL_TABLET | ORAL | 5 refills | Status: DC
Start: 2017-09-20 — End: 2018-04-28

## 2018-01-07 ENCOUNTER — Ambulatory Visit
Admit: 2018-01-07 | Discharge: 2018-01-07 | Payer: BLUE CROSS/BLUE SHIELD | Attending: Clinical Nurse Specialist | Primary: Family Medicine

## 2018-01-07 ENCOUNTER — Encounter: Attending: Cardiovascular Disease | Primary: Family Medicine

## 2018-01-07 DIAGNOSIS — I2510 Atherosclerotic heart disease of native coronary artery without angina pectoris: Secondary | ICD-10-CM

## 2018-01-07 MED ORDER — NITROGLYCERIN 0.4 MG SL SUBL
0.4 MG | ORAL_TABLET | SUBLINGUAL | 3 refills | Status: DC | PRN
Start: 2018-01-07 — End: 2018-08-19

## 2018-01-07 NOTE — Patient Instructions (Signed)
Recommend smoking cessation   Follow up in 6 mos With Dr. Urban Gibson   Call with any questions or concerns  Follow up with [name] for non cardiac problems  Report any new problems  Cardiovascular Fitness-Exercise as tolerated.  Strive for 30 minutes of exercise most days of the week.    Cardiac / Healthy Diet  Continue current medications as directed  Continue plan of treatment  It is always recommended that you bring your medications bottles with you to each visit - this is for your safety!

## 2018-01-07 NOTE — Progress Notes (Signed)
 [facility]  [address], [place]  [address]  Phone: [phone]  Fax: [phone]    OFFICE VISIT:  01/07/2018    Gregory Escobar - DOB: [birth year]    Reason For Visit:  Gregory Escobar is a 47 y.o. male who is here for 6 Month Follow-Up (no cardiac symptoms) and Coronary Artery Disease  History of anterior wall MI in 2016.  Since stenting of the LAD  The echo in March 2016 improvement of LV function with EF 35 to 40%.  Anterior apical hypokinesis noted.  No significant valvular disease    He is here in follow up.  Has had recent breakup with his girlfriend and noticed some palpitations.  Otherwise he is active and doing well.  He has not had any signs of angina or change of activity tolerance.  He does smoke a pack a day still.      Subjective  Gregory Escobar denies exertional chest pain, shortness of breath, orthopnea, paroxysmal nocturnal dyspnea, syncope, presyncope, arrhythmia, edema and fatigue.  The patient denies numbness or weakness to suggest cerebrovascular accident or transient ischemic attack.    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  Gregory Escobar has the following history as recorded in EpicCare:    Patient Active Problem List    Diagnosis Date Noted   . History of myocardial infarction 11/27/2015   . Decreased left ventricular systolic function 11/27/2015   . Smoker 11/27/2015   . HTN (hypertension)    . Hyperlipemia    . Cardiac LV ejection fraction 30-35%    . Coronary artery disease involving native coronary artery 03/13/2014   . Acute MI, anterior wall (HCC) 03/13/2014     Past Medical History:   Diagnosis Date   . 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 CAD (coronary artery disease) 2016   . Cardiac LV ejection fraction 30-35%     MI 03/13/14 cath revealed EF 30-35 at that time   . HTN (hypertension)    . Hyperlipemia    . Kidney stones 05/2013     Past Surgical History:   Procedure Laterality Date   . LITHOTRIPSY  2015   . URETER STENT PLACEMENT  2015     Family History   Problem  Relation Age of Onset   . Heart Disease Father    . High Blood Pressure Father    . High Cholesterol Father      Social History     Tobacco Use   . Smoking status: Current Every Day Smoker     Packs/day: 0.25     Years: 20.00     Pack years: 5.00   . Smokeless tobacco: Former Neurosurgeon     Types: Snuff     Quit date: 05/22/1990   Substance Use Topics   . Alcohol use: No     Comment: recovering addict      Current Outpatient Medications   Medication Sig Dispense Refill   . nitroGLYCERIN  (NITROSTAT ) 0.4 MG SL tablet Place 1 tablet under the tongue every 5 minutes as needed for Chest pain (if you have to take 3 then go the ER) 25 tablet 3   . metoprolol  tartrate (LOPRESSOR ) 25 MG tablet TAKE 1 TABLET BY MOUTH TWICE DAILY 60 tablet 5   . lisinopril  (PRINIVIL ;ZESTRIL ) 2.5 MG tablet TAKE 1 TABLET BY MOUTH TWICE DAILY 60 tablet 5   . clopidogrel  (PLAVIX ) 75 MG tablet TAKE 1 TABLET BY MOUTH ONCE DAILY  90 tablet 2   . atorvastatin  (LIPITOR ) 40 MG tablet Take 1 tablet by mouth daily 90 tablet 3   . Multiple Vitamins-Minerals (THERAPEUTIC MULTIVITAMIN-MINERALS) tablet Take 1 tablet by mouth daily       No current facility-administered medications for this visit.      Allergies: Patient has no known allergies.    Review of Systems  Constitutional - no significant activity change, appetite change, or unexpected weight change. No fever, chills or diaphoresis.  No fatigue.   HEENT - no significant rhinorrhea or epistaxis. No tinnitus or significant hearing loss.   Eyes - no sudden vision change or amaurosis.   Respiratory - no significant wheezing, stridor, apnea or cough.  No dyspnea on exertion or shortness of breath.  Cardiovascular - no exertional chest pain, orthopnea or PND.  No sensation of arrhythmia or slow heart rate.   No claudication or leg edema.  Gastrointestinal - no abdominal swelling or pain. No blood in stool. No severe constipation, diarrhea, nausea, or vomiting.   Genitourinary - no difficulty urinating, dysuria,  frequency, or urgency. No flank pain or hematuria.   Musculoskeletal - no back pain, gait disturbance, or myalgia.   Skin - no color change or rash.  No pallor.  No new surgical incision.  Neurologic - no speech difficulty, facial asymmetry or lateralizing weakness.  No seizures, presyncope, syncope, or significant dizziness.  Hematologic - no easy bruising or excessive bleeding.   Psychiatric - no severe anxiety or insomnia.  No confusion.   All other review of systems are negative.      Objective  Vital Signs - BP 118/64   Pulse 50   Ht 5\' 7"  (1.702 m)   Wt 144 lb (65.3 kg)   BMI 22.55 kg/m   General - Gregory Escobar is alert, cooperative, and pleasant.  Well groomed.  No acute distress.    Body habitus is normal.  HEENT - The head is normocephalic. No circumoral cyanosis.  Dentition - missing several teeth  EYES -  No Xanthelasma, no arcus senilis, no conjunctival hemorrhages or discharge.   Neck - Supple, without increased jugular venous pressures.  No carotid bruits.  No mass.   Respiratory - Lungs are clear bilaterally.  No wheezes or rales.  Normal effort without use of accessory muscles.  Cardiovascular - Heart has regular rhythm and rate.  No murmurs, rubs or gallops.    + pedal pulses and no varicosities.      Abdominal -  Soft, nontender, nondistended.  Bowel sounds are intact.   Extremities - No clubbing, cyanosis, or  edema.   Musculoskeletal -  No clubbing .  No Osler's nodes.   Gait normal .  No kyphosis or scoliosis.   Skin - no statis ulcers or dermatitis.  Neurological - No focal signs are identified.  Oriented to person, place and time.    Psychiatric -  Appropriate affect and mood.       Assessment:     Diagnosis Orders   1. Coronary artery disease involving native coronary artery of native heart without angina pectoris     2. Essential hypertension  EKG 12 lead   3. Decreased left ventricular systolic function     4. Mixed hyperlipidemia     5. Smoker       Data:  BP Readings from Last 3  Encounters:   01/07/18 118/64   07/01/17 120/80   12/21/16 114/62    Pulse Readings from Last 3 Encounters:  01/07/18 50   07/01/17 81   06/18/16 66        Wt Readings from Last 3 Encounters:   01/07/18 144 lb (65.3 kg)   07/01/17 148 lb (67.1 kg)   12/21/16 144 lb (65.3 kg)   KG today shows sinus bradycardia with a rate of 50.  Old anterior infarct.  Compared to previous EKG is unchanged    Blood pressure and heart rate controlled.  Medical management includes beta-blocker, ACE inhibitor, Plavix  and statin  Labs checked earlier this year, stable    Discussed the importance of smoking cessation rationales.    States taking medications as prescribed  Stable cardiovascular status. No evidence of overt heart failure, angina or dysrhythmia.     Plan  Recommend smoking cessation   Follow up in 6 mos With Dr. Pinkey Brier   Call with any questions or concerns  Follow up with David Zetter for non cardiac problems  Report any new problems  Cardiovascular Fitness-Exercise as tolerated.  Strive for 30 minutes of exercise most days of the week.    Cardiac / Healthy Diet  Continue current medications as directed  Continue plan of treatment  It is always recommended that you bring your medications bottles with you to each visit - this is for your safety!       Gregory Lied, APRN    EMR dragon/transcription disclaimer: Much of this encounter note is electronic transcription/translation of spoken language to printed tach. Electronic translation of spoken language may be erroneous, or at times, nonsensical words or phrases may be inadvertently transcribed. Although, I have reviewed the note for such errors, some may still exist.

## 2018-04-28 ENCOUNTER — Encounter

## 2018-04-29 MED ORDER — LISINOPRIL 2.5 MG PO TABS
2.5 MG | ORAL_TABLET | ORAL | 5 refills | Status: DC
Start: 2018-04-29 — End: 2018-08-19

## 2018-04-29 MED ORDER — METOPROLOL TARTRATE 25 MG PO TABS
25 MG | ORAL_TABLET | ORAL | 5 refills | Status: DC
Start: 2018-04-29 — End: 2018-08-19

## 2018-05-25 ENCOUNTER — Encounter

## 2018-05-25 MED ORDER — CLOPIDOGREL BISULFATE 75 MG PO TABS
75 MG | ORAL_TABLET | ORAL | 3 refills | Status: DC
Start: 2018-05-25 — End: 2018-08-19

## 2018-07-11 MED ORDER — ATORVASTATIN CALCIUM 40 MG PO TABS
40 MG | ORAL_TABLET | ORAL | 0 refills | Status: DC
Start: 2018-07-11 — End: 2018-09-07

## 2018-08-19 ENCOUNTER — Ambulatory Visit
Admit: 2018-08-19 | Discharge: 2018-08-19 | Payer: BLUE CROSS/BLUE SHIELD | Attending: Clinical Nurse Specialist | Primary: Family Medicine

## 2018-08-19 DIAGNOSIS — I2510 Atherosclerotic heart disease of native coronary artery without angina pectoris: Secondary | ICD-10-CM

## 2018-08-19 MED ORDER — NITROGLYCERIN 0.4 MG SL SUBL
0.4 MG | ORAL_TABLET | SUBLINGUAL | 3 refills | Status: DC | PRN
Start: 2018-08-19 — End: 2019-05-19

## 2018-08-19 MED ORDER — CLOPIDOGREL BISULFATE 75 MG PO TABS
75 MG | ORAL_TABLET | ORAL | 3 refills | Status: DC
Start: 2018-08-19 — End: 2018-11-03

## 2018-08-19 MED ORDER — LISINOPRIL 2.5 MG PO TABS
2.5 MG | ORAL_TABLET | ORAL | 3 refills | Status: DC
Start: 2018-08-19 — End: 2018-11-03

## 2018-08-19 MED ORDER — METOPROLOL TARTRATE 25 MG PO TABS
25 MG | ORAL_TABLET | ORAL | 3 refills | Status: DC
Start: 2018-08-19 — End: 2018-11-03

## 2018-08-19 NOTE — Patient Instructions (Addendum)
 Recommend smoking cessation  Follow up in 6 -9 mos With Dr. Christopher  Call with any questions or concerns  Follow up with David Zetter for non cardiac problems  Report any new problems  Cardiovascular Fitness-Exercise as tolerated.  Strive for 30 minutes of exercise most days of the week.    Cardiac / Healthy Diet  Continue current medications as directed  Continue plan of treatment  It is always recommended that you bring your medications bottles with you to each visit - this is for your safety!       Tips to Help You Stop Smoking   Cigarette smoking is a preventable cause of death in the [place] .  If you have thought about quitting but haven't been able to, here are some reasons why you should and some ways to do it.       Here's Why   Quitting smoking now can decrease your risk of getting smoking-related illnesses like:   Heart disease, Stroke,  Cataracts, Macular degeneration, Thyroid conditions, Hearing loss, Erectile dysfunction, Dementia, Osteoporosis.    Several types of cancer, including:   Lung, Mouth, Esophagus, Larynx, Bladder, Pancreas, Kidney   Chronic lung diseases:   Bronchitis, Emphysema, Asthma   Here's How   Once you've decided to quit smoking, set your target quit date a few weeks away. In the time leading up to your quit day, try some of these ideas offered by the Tobacco Control Research Branch of the [employer] to help you successfully quit smoking.   For the best results, work with your doctor. Together, you can test your lung function and compare the results to those of a nonsmoking person. The results can be given to you as your lung age. Finding out your lung age right after having the test done may help you to stop smoking.     Your doctor can also discuss with you all of your options and refer you to smoking-cessation support groups. You may wish to use nicotine replacement (gum, patches, inhaler) or one of the prescription medications that have been shown to increase  quit rates and prolong abstinence from smoking. But whatever you and your doctor decide on these matters, it will still be you who decides when an how to quit. Here are some techniques:     Switch Brands   Switch to a brand you find distasteful.   Change to a brand that is low in tar and nicotine a couple of weeks before your target quit date. This will help change your smoking behavior. However, do not smoke more cigarettes, inhale them more often or more deeply, or place your fingertips over the holes in the filters. All of these actions will increase your nicotine intake, and the idea is to get your body used to functioning without nicotine.     Cut Down the Number of Cigarettes You Smoke   Smoke only half of each cigarette.   Each day, postpone the lighting of your first cigarette by one hour.   Decide you'll only smoke during odd or even hours of the day.   Decide beforehand how many cigarettes you'll smoke during the day. For each additional cigarette, give a dollar to your favorite charity.   Change your eating habits to help you cut down. For example, drink milk, which many people consider incompatible with smoking. End meals or snacks with something that won't lead to a cigarette.   Reach for a glass of juice instead of a  cigarette for a pick-me-up.   Remember: Cutting down can help you quit, but it's not a substitute for quitting. If you're down to about seven cigarettes a day, it's time to set your target quit date, and get ready to stick to it.     Don't Smoke Automatically   Smoke only those cigarettes you really want. Catch yourself before you light up a cigarette out of pure habit.   Don't empty your ashtrays. This will remind you of how many cigarettes you've smoked each day, and the sight and the smell of stale cigarettes butts will be very unpleasant.   Make yourself aware of each cigarette by using the opposite hand or putting cigarettes in an unfamiliar location or a different pocket to break  the automatic reach.   If you light up many times during the day without even thinking about it, try to look in a mirror each time you put a match to your cigarette. You may decide you don't need it.     Make Smoking Inconvenient   Stop buying cigarettes by the carton. Wait until one pack is empty before you buy another.   Stop carrying cigarettes with you at home or at work. Make them difficult to get to.     Make Smoking Unpleasant   Smoke only under circumstances that aren't especially pleasurable for you. If you like to smoke with others, smoke alone. Turn your chair to an empty corner and focus only on the cigarette you are smoking and all its many negative effects.   Collect all your cigarette butts in one large glass container as a visual reminder of the filth made by smoking.     Just Before Quitting   Practice going without cigarettes.   Don't think of never smoking again. Think of quitting in terms of one day at a time .   Tell yourself you won't smoke today, and then don't.   Clean your clothes to rid them of the cigarette smell, which can linger a long time.     On the Day You Quit   Throw away all your cigarettes and matches. Hide Engineer, mining.   Visit the dentist and have your teeth cleaned to get rid of tobacco stains. Notice how nice they look and resolve to keep them that way.   Make a list of things you'd like to buy for yourself or someone else. Estimate the cost in terms of packs of cigarettes, and put the money aside to buy these presents.   Keep very busy on the big day. Go to the movies, exercise, take long walks, or go bike riding.   Remind your family and friends that this is your quit date, and ask them to help you over the rough spots of the first couple of days and weeks.   Buy yourself a treat or do something special to celebrate.     Telephone and Internet Support   Call the [place] [employer] [facility] [employer].  Telephone, web-, and computer-based programs can offer you  the support that you need to quit and to stay smoke-free. You can find many programs online.    Immediately After Quitting   Develop a clean, fresh, nonsmoking environment around yourself at work and at home. Buy yourself flowers you may be surprised how much you can enjoy their scent now.   The first few days after you quit, spend as much free time as possible in places where smoking isn't allowed,  such as libraries, museums, theaters, department stores, and churches.   Drink large quantities of water and fruit juice (but avoid sodas that contain caffeine).   Try to avoid alcohol, coffee, and other beverages that you associate with cigarette smoking.   Strike up conversation instead of a Microbiologist for a cigarette.   If you miss the sensation of having a cigarette in your hand, play with something else a pencil, a paper clip, a marble.   If you miss having something in your mouth, try toothpicks or a fake cigarette.

## 2018-08-19 NOTE — Progress Notes (Signed)
 [facility]  [address], [place]  [address]  Phone: [phone]  Fax: [phone]    OFFICE VISIT:  08/19/2018    Gregory Escobar - DOB: [birth year]    Reason For Visit:  Gregory Escobar is a 48 y.o. male who is here for 6 Month Follow-Up (no cardiac symptoms) and Coronary Artery Disease  History of anterior wall MI in 2016.  Since stenting of the LAD  The echo in March 2016 improvement of LV function with EF 35 to 40%.  Anterior apical hypokinesis noted.  No significant valvular disease    He returns today in follow-up.  He states overall he is doing well.  He is not noticed any recurrent symptoms.  He does continue to smoke    Subjective  Gregory Escobar denies exertional chest pain, shortness of breath, orthopnea, paroxysmal nocturnal dyspnea, syncope, presyncope, arrhythmia, edema and fatigue.  The patient denies numbness or weakness to suggest cerebrovascular accident or transient ischemic attack.    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 Gregory Escobar has the following history as recorded in EpicCare:    Patient Active Problem List    Diagnosis Date Noted   . History of myocardial infarction 11/27/2015   . Decreased left ventricular systolic function 11/27/2015   . Smoker 11/27/2015   . HTN (hypertension)    . Hyperlipemia    . Cardiac LV ejection fraction 30-35%    . Coronary artery disease involving native coronary artery 03/13/2014   . Acute MI, anterior wall (HCC) 03/13/2014     Past Medical History:   Diagnosis Date   . 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 CAD (coronary artery disease) 2016   . Cardiac LV ejection fraction 30-35%     MI 03/13/14 cath revealed EF 30-35 at that time   . HTN (hypertension)    . Hyperlipemia    . Kidney stones 05/2013     Past Surgical History:   Procedure Laterality Date   . CARDIAC CATHETERIZATION  2016    Stent to LAD   . LITHOTRIPSY  2015   . URETER STENT PLACEMENT  2015     Family History   Problem Relation Age of Onset   . Heart Disease Father     . High Blood Pressure Father    . High Cholesterol Father      Social History     Tobacco Use   . Smoking status: Current Every Day Smoker     Packs/day: 0.25     Years: 20.00     Pack years: 5.00   . Smokeless tobacco: Former Neurosurgeon     Types: Snuff     Quit date: 05/22/1990   Substance Use Topics   . Alcohol use: No     Comment: recovering addict      Current Outpatient Medications   Medication Sig Dispense Refill   . nitroGLYCERIN  (NITROSTAT ) 0.4 MG SL tablet Place 1 tablet under the tongue every 5 minutes as needed for Chest pain (if you have to take 3 then go the ER) 25 tablet 3   . lisinopril  (PRINIVIL ;ZESTRIL ) 2.5 MG tablet TAKE 1 TABLET BY MOUTH TWICE DAILY 180 tablet 3   . metoprolol  tartrate (LOPRESSOR ) 25 MG tablet Take 1 tablet by mouth twice daily 180 tablet 3   . clopidogrel  (PLAVIX ) 75 MG tablet Take 1 tablet by mouth once daily 90 tablet 3   .  atorvastatin  (LIPITOR ) 40 MG tablet Take 1 tablet by mouth once daily 90 tablet 0   . Multiple Vitamins-Minerals (THERAPEUTIC MULTIVITAMIN-MINERALS) tablet Take 1 tablet by mouth daily       No current facility-administered medications for this visit.      Allergies: Patient has no known allergies.    Review of Systems  Constitutional - no significant activity change, appetite change, or unexpected weight change. No fever, chills or diaphoresis.  No fatigue.   HEENT - no significant rhinorrhea or epistaxis. No tinnitus or significant hearing loss.   Eyes - no sudden vision change or amaurosis.   Respiratory - no significant wheezing, stridor, apnea or cough.  No dyspnea on exertion or shortness of breath.  Cardiovascular - no exertional chest pain, orthopnea or PND.  No sensation of arrhythmia or slow heart rate.   No claudication or leg edema.  Gastrointestinal - no abdominal swelling or pain. No blood in stool. No severe constipation, diarrhea, nausea, or vomiting.   Genitourinary - no difficulty urinating, dysuria, frequency, or urgency. No flank pain or  hematuria.   Musculoskeletal - no back pain, gait disturbance, or myalgia.   Skin - no color change or rash.  No pallor.  No new surgical incision.  Neurologic - no speech difficulty, facial asymmetry or lateralizing weakness.  No seizures, presyncope, syncope, or significant dizziness.  Hematologic - no easy bruising or excessive bleeding.   Psychiatric - no severe anxiety or insomnia.  No confusion.   All other review of systems are negative.      Objective  Vital Signs - BP 124/64   Pulse 57   Ht 5' 7 (1.702 m)   Wt 148 lb (67.1 kg)   BMI 23.18 kg/m   General - Gregory Escobar is alert, cooperative, and pleasant.  Well groomed.  No acute distress.    Body habitus is normal.  HEENT - The head is normocephalic. No circumoral cyanosis.  Dentition-missing some bottom teeth.   EYES -  No Xanthelasma, no arcus senilis, no conjunctival hemorrhages or discharge.   Neck - Supple, without increased jugular venous pressures.  No carotid bruits.  No mass.   Respiratory - Lungs are clear bilaterally.  No wheezes or rales.  Normal effort without use of accessory muscles.  Cardiovascular - Heart has regular rhythm and rate.  No murmurs, rubs or gallops.    + pedal pulses and no varicosities.      Abdominal -  Soft, nontender, nondistended.  Bowel sounds are intact.   Extremities - No clubbing, cyanosis, or  edema.   Musculoskeletal -  No clubbing .  No Osler's nodes.   Gait normal .  No kyphosis or scoliosis.   Skin -  no statis ulcers or dermatitis.  Neurological - No focal signs are identified.  Oriented to person, place and time.    Psychiatric -  Appropriate affect and mood.       Assessment:     Diagnosis Orders   1. Coronary artery disease involving native coronary artery of native heart without angina pectoris     2. Essential hypertension  lisinopril  (PRINIVIL ;ZESTRIL ) 2.5 MG tablet    metoprolol  tartrate (LOPRESSOR ) 25 MG tablet   3. Mixed hyperlipidemia     4. Smoker     5. Decreased left ventricular systolic function      6. Coronary artery disease involving native heart without angina pectoris, unspecified vessel or lesion type  clopidogrel  (PLAVIX ) 75 MG tablet  Data:  BP Readings from Last 3 Encounters:   08/19/18 124/64   01/07/18 118/64   07/01/17 120/80    Pulse Readings from Last 3 Encounters:   08/19/18 57   01/07/18 50   07/01/17 81        Wt Readings from Last 3 Encounters:   08/19/18 148 lb (67.1 kg)   01/07/18 144 lb (65.3 kg)   07/01/17 148 lb (67.1 kg)     Blood pressure heart rate controlled.  Medical management includes beta-blocker, ACE inhibitor, statin and Plavix   Had long discussion on the importance of smoking cessation.  We discussed early heart disease and ongoing management.  Approximately 5 to 7 minutes was dedicated to smoking cessation counseling  Having some changes in insurance.  We will get his medicines changed over to 90 days and change pharmacy when he requests.  States taking medications as prescribed  Stable cardiovascular status. No evidence of overt heart failure, angina or dysrhythmia.     Plan  Recommend smoking cessation  Follow up in 6 -9 mos With Dr. Christopher  Call with any questions or concerns  Follow up with David Zetter for non cardiac problems  Report any new problems  Cardiovascular Fitness-Exercise as tolerated.  Strive for 30 minutes of exercise most days of the week.    Cardiac / Healthy Diet  Continue current medications as directed  Continue plan of treatment  It is always recommended that you bring your medications bottles with you to each visit - this is for your safety!       Rexene Sellers, APRN    EMR dragon/transcription disclaimer: Much of this encounter note is electronic transcription/translation of spoken language to printed tach. Electronic translation of spoken language may be erroneous, or at times, nonsensical words or phrases may be inadvertently transcribed. Although, I have reviewed the note for such errors, some may still exist.

## 2018-08-30 NOTE — Telephone Encounter (Signed)
Called and spoke with Gregory Escobar (he was on break at work) advised him I had received information stating he had received a bill for a copay that he had already paid on 01-07-18 appt with Gregory Escobar.   I told him I would be handling his case and that I pulled the cash drawer records for that date of 01-07-18 for cash-credit/debit card and checks collected for that date and unfortunately it didn't show where we had collected a payment for his account-copay.Told him it doesn't mean that he didn't pay as the credit card reader can at times not connect/capture payments  and collect the payment, I then asked how he submitted his payment and he stated he paid by debit card.   I then explained how to get this 30 deducted from his bad debit account he would need to supply me his bank statement for that date showing the amount of 30 deducted from his account so that I could in turn submit to corporate do get his account here credited.  He seem to think he couldn't find his statements and then advised him he could easily obtain this from his bank directly and pt understood.

## 2018-09-07 MED ORDER — ATORVASTATIN CALCIUM 40 MG PO TABS
40 MG | ORAL_TABLET | ORAL | 0 refills | Status: DC
Start: 2018-09-07 — End: 2019-01-18

## 2018-11-03 ENCOUNTER — Encounter

## 2018-11-03 MED ORDER — LISINOPRIL 2.5 MG PO TABS
2.5 MG | ORAL_TABLET | ORAL | 3 refills | Status: DC
Start: 2018-11-03 — End: 2019-08-14

## 2018-11-03 MED ORDER — CLOPIDOGREL BISULFATE 75 MG PO TABS
75 MG | ORAL_TABLET | ORAL | 3 refills | Status: DC
Start: 2018-11-03 — End: 2019-08-14

## 2018-11-03 MED ORDER — METOPROLOL TARTRATE 25 MG PO TABS
25 MG | ORAL_TABLET | ORAL | 3 refills | Status: DC
Start: 2018-11-03 — End: 2019-08-14

## 2019-01-10 ENCOUNTER — Encounter

## 2019-01-18 MED ORDER — ATORVASTATIN CALCIUM 40 MG PO TABS
40 MG | ORAL_TABLET | ORAL | 3 refills | Status: DC
Start: 2019-01-18 — End: 2019-12-07

## 2019-01-24 NOTE — Telephone Encounter (Signed)
Can send letter.  Does have depressed LV function with EF 35 to 40% at last check.

## 2019-01-24 NOTE — Telephone Encounter (Signed)
Letter sent.

## 2019-01-24 NOTE — Telephone Encounter (Signed)
Date: TBD    Cardiologist: Dr. Urban Gibson    Procedure: Teeth fillings with 2% lid with epi     Surgeon: Mindi Curling     Last Office Visit: 08-19-18    Reason for office visit and medical concerns addressed at this office visit: CAD, HTN, HL, Hx of MI    Testing Performed and Date of Service:  EKG 01-07-18    RCRI = low, 1pt, 0.9%   METs 4    Current Medications: Plavix, lipitor, lisinopril, lopressor, nitro     Is the patient currently taking an anticoagulant? If so, what is the diagnosis the patient has been given to warrant the need for the anticoagulant? Plavix     Additional Notes: Cardiac Risk Request

## 2019-05-19 ENCOUNTER — Ambulatory Visit
Admit: 2019-05-19 | Discharge: 2019-05-19 | Payer: BLUE CROSS/BLUE SHIELD | Attending: Clinical Nurse Specialist | Primary: Family Medicine

## 2019-05-19 DIAGNOSIS — I2510 Atherosclerotic heart disease of native coronary artery without angina pectoris: Secondary | ICD-10-CM

## 2019-05-19 MED ORDER — NITROGLYCERIN 0.4 MG SL SUBL
0.4 MG | ORAL_TABLET | SUBLINGUAL | 3 refills | Status: DC | PRN
Start: 2019-05-19 — End: 2020-02-13

## 2019-05-19 NOTE — Progress Notes (Signed)
[facility]  [address], [place]  [address]  Phone: [phone]  Fax: [phone]    OFFICE VISIT:  05/19/2019    Gregory Escobar - DOB: 21-Dec-1970    Reason For Visit:  Gregory Escobar is a 49 y.o. male who is here for Follow-up (No cardiac symptoms.) and Coronary Artery Disease  History of anterior wall MI in 2016. Since stenting of the LAD  The echo in March 2016 improvement of LV function with EF 35 to 40%. Anterior apical hypokinesis noted. No significant valvular disease    He returns today in follow-up.  He states overall he is doing well.  He is not noticed any recurrent symptoms.  He does continue to smoke      Subjective  Gregory Escobar denies exertional chest pain, shortness of breath, orthopnea, paroxysmal nocturnal dyspnea, syncope, presyncope, arrhythmia, edema and fatigue.  The patient denies numbness or weakness to suggest cerebrovascular accident or transient ischemic attack.    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    Gregory Escobar has the following history as recorded in EpicCare:    Patient Active Problem List    Diagnosis Date Noted   . History of myocardial infarction 11/27/2015   . Decreased left ventricular systolic function 11/27/2015   . Smoker 11/27/2015   . HTN (hypertension)    . Hyperlipemia    . Cardiac LV ejection fraction 30-35%    . Coronary artery disease involving native coronary artery 03/13/2014   . Acute MI, anterior wall (HCC) 03/13/2014     Past Medical History:   Diagnosis Date   . 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 CAD (coronary artery disease) 2016   . Cardiac LV ejection fraction 30-35%     MI 03/13/14 cath revealed EF 30-35 at that time   . HTN (hypertension)    . Hyperlipemia    . Kidney stones 05/2013     Past Surgical History:   Procedure Laterality Date   . CARDIAC CATHETERIZATION  2016    Stent to LAD   . LITHOTRIPSY  2015   . URETER STENT PLACEMENT  2015     Family History   Problem Relation Age of Onset   . Heart Disease Father     . High Blood Pressure Father    . High Cholesterol Father      Social History     Tobacco Use   . Smoking status: Current Every Day Smoker     Packs/day: 0.50     Years: 20.00     Pack years: 10.00   . Smokeless tobacco: Former Neurosurgeon     Types: Snuff     Quit date: 05/22/1990   Substance Use Topics   . Alcohol use: No     Comment: recovering addict      Current Outpatient Medications   Medication Sig Dispense Refill   . nitroGLYCERIN (NITROSTAT) 0.4 MG SL tablet Place 1 tablet under the tongue every 5 minutes as needed for Chest pain (if you have to take 3 then go the ER) 25 tablet 3   . atorvastatin (LIPITOR) 40 MG tablet Take 1 tablet by mouth once daily 90 tablet 3   . lisinopril (PRINIVIL;ZESTRIL) 2.5 MG tablet TAKE 1 TABLET BY MOUTH TWICE DAILY 180 tablet 3   . metoprolol tartrate (LOPRESSOR) 25 MG tablet Take 1 tablet by mouth twice daily 180 tablet 3   . clopidogrel (PLAVIX) 75 MG  tablet Take 1 tablet by mouth once daily 90 tablet 3   . Multiple Vitamins-Minerals (THERAPEUTIC MULTIVITAMIN-MINERALS) tablet Take 1 tablet by mouth daily       No current facility-administered medications for this visit.      Allergies: Patient has no known allergies.    Review of Systems  Constitutional - no significant activity change, appetite change, or unexpected weight change. No fever, chills or diaphoresis.  No fatigue.   HEENT - no significant rhinorrhea or epistaxis. No tinnitus or significant hearing loss.   Eyes - no sudden vision change or amaurosis.   Respiratory - no significant wheezing, stridor, apnea or cough.  No dyspnea on exertion or shortness of breath.  Cardiovascular - no exertional chest pain, orthopnea or PND.  No sensation of arrhythmia or slow heart rate.   No claudication or leg edema.  Gastrointestinal - no abdominal swelling or pain. No blood in stool. No severe constipation, diarrhea, nausea, or vomiting.   Genitourinary - no difficulty urinating, dysuria, frequency, or urgency. No flank pain or  hematuria.   Musculoskeletal - no back pain, gait disturbance, or myalgia.   Skin - no color change or rash.  No pallor.  No new surgical incision.  Neurologic - no speech difficulty, facial asymmetry or lateralizing weakness.  No seizures, presyncope, syncope, or significant dizziness.  Hematologic - no easy bruising or excessive bleeding.   Psychiatric - no severe anxiety or insomnia.  No confusion.   All other review of systems are negative.      Objective  Vital Signs - BP 108/72   Pulse 54   Ht 5\' 7"  (1.702 m)   Wt 152 lb (68.9 kg)   BMI 23.81 kg/m   General - Gregory Escobar is alert, cooperative, and pleasant.  Well groomed.  No acute distress.    Body habitus is normla.  HEENT - The head is normocephalic. No circumoral cyanosis.  Dentition is normal.   EYES -  No Xanthelasma, no arcus senilis, no conjunctival hemorrhages or discharge.   Neck - Supple, without increased jugular venous pressures.  No carotid bruits.  No mass.   Respiratory - Lungs are clear bilaterally.  No wheezes or rales.  Normal effort without use of accessory muscles.  Cardiovascular - Heart has regular rhythm and rate.  No murmurs, rubs or gallops.    + pedal pulses and no varicosities.      Abdominal -  Soft, nontender, nondistended.  Bowel sounds are intact.   Extremities - No clubbing, cyanosis, or  edema.   Musculoskeletal -  No clubbing .  No Osler's nodes.   Gait normal .  No kyphosis or scoliosis.   Skin -  no statis ulcers or dermatitis.  Neurological - No focal signs are identified.  Oriented to person, place and time.    Psychiatric -  Appropriate affect and mood.       Assessment:     Diagnosis Orders   1. Coronary artery disease involving native coronary artery of native heart without angina pectoris     2. Acute MI, anterior wall (HCC)  EKG 12 lead    nitroGLYCERIN (NITROSTAT) 0.4 MG SL tablet   3. Essential hypertension     4. Mixed hyperlipidemia     5. Smoker       Data:  BP Readings from Last 3 Encounters:   05/19/19 108/72    08/19/18 124/64   01/07/18 118/64    Pulse Readings from Last 3 Encounters:  05/19/19 54   08/19/18 57   01/07/18 50        Wt Readings from Last 3 Encounters:   05/19/19 152 lb (68.9 kg)   08/19/18 148 lb (67.1 kg)   01/07/18 144 lb (65.3 kg)     EKG today shows sinus bradycardia with a rate of 54.  Negative T waves appears to be anterior ischemia however compared to previous EKG is unchanged    Medical management includes beta-blocker, ACE inhibitor, Plavix and statin  Still smoking -discussed rationale and strategies to quit smoking.  We discussed short and long-term effects of smoking cessation.  Approximately 30 minutes were spent on smoking cessation counseling  Is active but no regular exercise   States taking medications as prescribed  Stable cardiovascular status. No evidence of overt heart failure, angina or dysrhythmia.     Plan  Recommend smoking cessation  Follow up in 9 mos With Dr. Urban Gibson   Call with any questions or concerns  Follow up with Gregory Escobar for non cardiac problems  Report any new problems  Cardiovascular Fitness-Exercise as tolerated.  Strive for 30 minutes of exercise most days of the week.    Cardiac / Healthy Diet  Continue current medications as directed  Continue plan of treatment  It is always recommended that you bring your medications bottles with you to each visit - this is for your safety!       Gregory Bruckner, APRN    EMR dragon/transcription disclaimer: Much of this encounter note is electronic transcription/translation of spoken language to printed tach. Electronic translation of spoken language may be erroneous, or at times, nonsensical words or phrases may be inadvertently transcribed. Although, I have reviewed the note for such errors, some may still exist.

## 2019-05-19 NOTE — Patient Instructions (Addendum)
Recommend smoking cessation  Follow up in 9 mos With Dr. Urban Gibson   Call with any questions or concerns  Follow up with [name] for non cardiac problems  Report any new problems  Cardiovascular Fitness-Exercise as tolerated.  Strive for 30 minutes of exercise most days of the week.    Cardiac / Healthy Diet  Continue current medications as directed  Continue plan of treatment  It is always recommended that you bring your medications bottles with you to each visit - this is for your safety!           Tips to Help You Stop Smoking   Cigarette smoking is a preventable cause of death in the [place].  If you have thought about quitting but haven't been able to, here are some reasons why you should and some ways to do it.       Here's Why   Quitting smoking now can decrease your risk of getting smoking-related illnesses like:   Heart disease, Stroke,  Cataracts, Macular degeneration, Thyroid conditions, Hearing loss, Erectile dysfunction, Dementia, Osteoporosis.    Several types of cancer, including:   Lung, Mouth, Esophagus, Larynx, Bladder, Pancreas, Kidney   Chronic lung diseases:   Bronchitis, Emphysema, Asthma   Here's How   Once you've decided to quit smoking, set your target quit date a few weeks away. In the time leading up to your quit day, try some of these ideas offered by the Tobacco Control Research Branch of the National Cancer Institute to help you successfully quit smoking.   For the best results, work with your doctor. Together, you can test your lung function and compare the results to those of a nonsmoking person. The results can be given to you as your lung age. Finding out your lung age right after having the test done may help you to stop smoking.     Your doctor can also discuss with you all of your options and refer you to smoking-cessation support groups. You may wish to use nicotine replacement (gum, patches, inhaler) or one of the prescription medications that have been shown to increase  quit rates and prolong abstinence from smoking. But whatever you and your doctor decide on these matters, it will still be you who decides when an how to quit. Here are some techniques:     Switch Brands   Switch to a brand you find distasteful.   Change to a brand that is low in tar and nicotine a couple of weeks before your target quit date. This will help change your smoking behavior. However, do not smoke more cigarettes, inhale them more often or more deeply, or place your fingertips over the holes in the filters. All of these actions will increase your nicotine intake, and the idea is to get your body used to functioning without nicotine.     Cut Down the Number of Cigarettes You Smoke   Smoke only half of each cigarette.   Each day, postpone the lighting of your first cigarette by one hour.   Decide you'll only smoke during odd or even hours of the day.   Decide beforehand how many cigarettes you'll smoke during the day. For each additional cigarette, give a dollar to your favorite charity.   Change your eating habits to help you cut down. For example, drink milk, which many people consider incompatible with smoking. End meals or snacks with something that won't lead to a cigarette.   Reach for a glass of  juice instead of a cigarette for a "pick-me-up."   Remember: Cutting down can help you quit, but it's not a substitute for quitting. If you're down to about seven cigarettes a day, it's time to set your target quit date, and get ready to stick to it.     Don't Smoke "Automatically"   Smoke only those cigarettes you really want. Catch yourself before you light up a cigarette out of pure habit.   Don't empty your ashtrays. This will remind you of how many cigarettes you've smoked each day, and the sight and the smell of stale cigarettes butts will be very unpleasant.   Make yourself aware of each cigarette by using the opposite hand or putting cigarettes in an unfamiliar location or a different pocket to break  the automatic reach.   If you light up many times during the day without even thinking about it, try to look in a mirror each time you put a match to your cigarette. You may decide you don't need it.     Make Smoking Inconvenient   Stop buying cigarettes by the carton. Wait until one pack is empty before you buy another.   Stop carrying cigarettes with you at home or at work. Make them difficult to get to.     Make Smoking Unpleasant   Smoke only under circumstances that aren't especially pleasurable for you. If you like to smoke with others, smoke alone. Turn your chair to an empty corner and focus only on the cigarette you are smoking and all its many negative effects.   Collect all your cigarette butts in one large glass container as a visual reminder of the filth made by smoking.     Just Before Quitting   Practice going without cigarettes.   Don't think of never smoking again. Think of quitting in terms of one day at a time .   Tell yourself you won't smoke today, and then don't.   Clean your clothes to rid them of the cigarette smell, which can linger a long time.     On the Day You Quit   Throw away all your cigarettes and matches. Hide Engineer, mining.   Visit the dentist and have your teeth cleaned to get rid of tobacco stains. Notice how nice they look and resolve to keep them that way.   Make a list of things you'd like to buy for yourself or someone else. Estimate the cost in terms of packs of cigarettes, and put the money aside to buy these presents.   Keep very busy on the big day. Go to the movies, exercise, take long walks, or go bike riding.   Remind your family and friends that this is your quit date, and ask them to help you over the rough spots of the first couple of days and weeks.   Buy yourself a treat or do something special to celebrate.     Telephone and Internet Support   Call the [place] [employer] Quit [employer].  Telephone, web-, and computer-based programs can offer you  the support that you need to quit and to stay smoke-free. You can find many programs online.    Immediately After Quitting   Develop a clean, fresh, nonsmoking environment around yourself at work and at home. Buy yourself flowers you may be surprised how much you can enjoy their scent now.   The first few days after you quit, spend as much free time as possible in places  where smoking isn't allowed, such as libraries, museums, theaters, department stores, and churches.   Drink large quantities of water and fruit juice (but avoid sodas that contain caffeine).   Try to avoid alcohol, coffee, and other beverages that you associate with cigarette smoking.   Strike up conversation instead of a Microbiologist for a cigarette.   If you miss the sensation of having a cigarette in your hand, play with something else a pencil, a paper clip, a marble.   If you miss having something in your mouth, try toothpicks or a fake cigarette.

## 2019-08-11 ENCOUNTER — Encounter

## 2019-08-14 MED ORDER — CLOPIDOGREL BISULFATE 75 MG PO TABS
75 MG | ORAL_TABLET | ORAL | 3 refills | Status: DC
Start: 2019-08-14 — End: 2020-07-17

## 2019-08-14 MED ORDER — LISINOPRIL 2.5 MG PO TABS
2.5 MG | ORAL_TABLET | ORAL | 3 refills | Status: DC
Start: 2019-08-14 — End: 2020-07-17

## 2019-08-14 MED ORDER — METOPROLOL TARTRATE 25 MG PO TABS
25 MG | ORAL_TABLET | ORAL | 3 refills | Status: DC
Start: 2019-08-14 — End: 2020-07-17

## 2019-11-29 ENCOUNTER — Encounter

## 2019-11-29 NOTE — Telephone Encounter (Signed)
Pt called needing lab orders faxed to his PCP. Lab orders printed and faxed to [phone].

## 2019-12-05 LAB — LIPID PANEL
Cholesterol, Total: 141 mg/dL (ref 100–199)
HDL: 40 mg/dL (ref >39)
LDL Calculated: 87 mg/dL (ref 0–99)
Triglycerides: 67 mg/dL (ref 0–149)
VLDL Cholesterol Calculated: 14 mg/dL (ref 5–40)

## 2019-12-05 LAB — AST: AST: 29 IU/L (ref 0–40)

## 2019-12-05 LAB — ALT: ALT: 48 IU/L — ABNORMAL HIGH (ref 0–44)

## 2019-12-07 MED ORDER — ATORVASTATIN CALCIUM 40 MG PO TABS
40 MG | ORAL_TABLET | ORAL | 3 refills | Status: DC
Start: 2019-12-07 — End: 2020-09-24

## 2020-02-09 ENCOUNTER — Encounter: Payer: BLUE CROSS/BLUE SHIELD | Attending: Clinical Nurse Specialist | Primary: Family Medicine

## 2020-02-12 ENCOUNTER — Ambulatory Visit: Payer: Self-pay | Admitting: Family Medicine

## 2020-02-13 ENCOUNTER — Ambulatory Visit
Admit: 2020-02-13 | Discharge: 2020-02-13 | Payer: BLUE CROSS/BLUE SHIELD | Attending: Clinical Nurse Specialist | Primary: Family Medicine

## 2020-02-13 DIAGNOSIS — I2510 Atherosclerotic heart disease of native coronary artery without angina pectoris: Secondary | ICD-10-CM

## 2020-02-13 MED ORDER — NITROGLYCERIN 0.4 MG SL SUBL
0.4 MG | ORAL_TABLET | SUBLINGUAL | 3 refills | Status: AC | PRN
Start: 2020-02-13 — End: ?

## 2020-02-13 NOTE — Patient Instructions (Signed)
Strongly recommend smoking cessation   Stress echo soon  Follow up in 6-9 mos Dr Urban Gibson   Call with any questions or concerns  Follow up with [name] for non cardiac problems  Report any new problems  Cardiovascular Fitness-Exercise as tolerated.  Strive for 30 minutes of exercise most days of the week.    Cardiac / Healthy Diet  Continue current medications as directed  Continue plan of treatment  It is always recommended that you bring your medications bottles with you to each visit - this is for your safety!           Tips to Help You Stop Smoking   Cigarette smoking is a preventable cause of death in the [place].  If you have thought about quitting but haven't been able to, here are some reasons why you should and some ways to do it.       Here's Why   Quitting smoking now can decrease your risk of getting smoking-related illnesses like:   Heart disease, Stroke,  Cataracts, Macular degeneration, Thyroid conditions, Hearing loss, Erectile dysfunction, Dementia, Osteoporosis.    Several types of cancer, including:   Lung, Mouth, Esophagus, Larynx, Bladder, Pancreas, Kidney   Chronic lung diseases:   Bronchitis, Emphysema, Asthma   Here's How   Once you've decided to quit smoking, set your target quit date a few weeks away. In the time leading up to your quit day, try some of these ideas offered by the Tobacco Control Research Branch of the National Cancer Institute to help you successfully quit smoking.   For the best results, work with your doctor. Together, you can test your lung function and compare the results to those of a nonsmoking person. The results can be given to you as your lung age. Finding out your lung age right after having the test done may help you to stop smoking.     Your doctor can also discuss with you all of your options and refer you to smoking-cessation support groups. You may wish to use nicotine replacement (gum, patches, inhaler) or one of the prescription medications that have  been shown to increase quit rates and prolong abstinence from smoking. But whatever you and your doctor decide on these matters, it will still be you who decides when an how to quit. Here are some techniques:     Switch Brands   Switch to a brand you find distasteful.   Change to a brand that is low in tar and nicotine a couple of weeks before your target quit date. This will help change your smoking behavior. However, do not smoke more cigarettes, inhale them more often or more deeply, or place your fingertips over the holes in the filters. All of these actions will increase your nicotine intake, and the idea is to get your body used to functioning without nicotine.     Cut Down the Number of Cigarettes You Smoke   Smoke only half of each cigarette.   Each day, postpone the lighting of your first cigarette by one hour.   Decide you'll only smoke during odd or even hours of the day.   Decide beforehand how many cigarettes you'll smoke during the day. For each additional cigarette, give a dollar to your favorite charity.   Change your eating habits to help you cut down. For example, drink milk, which many people consider incompatible with smoking. End meals or snacks with something that won't lead to a cigarette.  Reach for a glass of juice instead of a cigarette for a "pick-me-up."   Remember: Cutting down can help you quit, but it's not a substitute for quitting. If you're down to about seven cigarettes a day, it's time to set your target quit date, and get ready to stick to it.     Don't Smoke "Automatically"   Smoke only those cigarettes you really want. Catch yourself before you light up a cigarette out of pure habit.   Don't empty your ashtrays. This will remind you of how many cigarettes you've smoked each day, and the sight and the smell of stale cigarettes butts will be very unpleasant.   Make yourself aware of each cigarette by using the opposite hand or putting cigarettes in an unfamiliar location or a  different pocket to break the automatic reach.   If you light up many times during the day without even thinking about it, try to look in a mirror each time you put a match to your cigarette. You may decide you don't need it.     Make Smoking Inconvenient   Stop buying cigarettes by the carton. Wait until one pack is empty before you buy another.   Stop carrying cigarettes with you at home or at work. Make them difficult to get to.     Make Smoking Unpleasant   Smoke only under circumstances that aren't especially pleasurable for you. If you like to smoke with others, smoke alone. Turn your chair to an empty corner and focus only on the cigarette you are smoking and all its many negative effects.   Collect all your cigarette butts in one large glass container as a visual reminder of the filth made by smoking.     Just Before Quitting   Practice going without cigarettes.   Don't think of never smoking again. Think of quitting in terms of one day at a time .   Tell yourself you won't smoke today, and then don't.   Clean your clothes to rid them of the cigarette smell, which can linger a long time.     On the Day You Quit   Throw away all your cigarettes and matches. Hide Engineer, mining.   Visit the dentist and have your teeth cleaned to get rid of tobacco stains. Notice how nice they look and resolve to keep them that way.   Make a list of things you'd like to buy for yourself or someone else. Estimate the cost in terms of packs of cigarettes, and put the money aside to buy these presents.   Keep very busy on the big day. Go to the movies, exercise, take long walks, or go bike riding.   Remind your family and friends that this is your quit date, and ask them to help you over the rough spots of the first couple of days and weeks.   Buy yourself a treat or do something special to celebrate.     Telephone and Internet Support   Call the [place] Tobacco [facility]uit [employer].  Telephone, web-, and computer-based  programs can offer you the support that you need to quit and to stay smoke-free. You can find many programs online.    Immediately After Quitting   Develop a clean, fresh, nonsmoking environment around yourself at work and at home. Buy yourself flowers you may be surprised how much you can enjoy their scent now.   The first few days after you quit, spend as much free  time as possible in places where smoking isn't allowed, such as libraries, museums, theaters, department stores, and churches.   Drink large quantities of water and fruit juice (but avoid sodas that contain caffeine).   Try to avoid alcohol, coffee, and other beverages that you associate with cigarette smoking.   Strike up conversation instead of a Microbiologist for a cigarette.   If you miss the sensation of having a cigarette in your hand, play with something else a pencil, a paper clip, a marble.   If you miss having something in your mouth, try toothpicks or a fake cigarette.

## 2020-02-13 NOTE — Progress Notes (Signed)
[facility]  [address], [place]  [address]  Phone: [phone]  Fax: [phone]    OFFICE VISIT:  02/13/2020    Gregory Escobar - DOB: 01-28-1971    Reason For Visit:  Gregory Escobar is a 49 y.o. male who is here for Follow-up (no cardiac symptoms) and Coronary Artery Disease  History of anterior wall MI in 2016. Since stenting of the LAD  The echo in March 2016 improvement of LV function with EF 35 to 40%. Anterior apical hypokinesis noted. No significant valvular disease    He returns today for routine follow-up.  He states overall he is active and doing well.  No regular routine exercise but is very active at his job and walks a lot  He states he does try to eat fairly healthy.  Unfortunately he is still smoking.  Down about 3/4 pack cigarettes a day      Subjective  Gregory Escobar denies exertional chest pain, shortness of breath, orthopnea, paroxysmal nocturnal dyspnea, syncope, presyncope, arrhythmia, edema and fatigue.  The patient denies numbness or weakness to suggest cerebrovascular accident or transient ischemic attack.    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  Gregory Escobar has the following history as recorded in EpicCare:    Patient Active Problem List    Diagnosis Date Noted   . History of myocardial infarction 11/27/2015   . Decreased left ventricular systolic function 11/27/2015   . Smoker 11/27/2015   . HTN (hypertension)    . Hyperlipemia    . Cardiac LV ejection fraction 30-35%    . Coronary artery disease involving native coronary artery 03/13/2014   . Acute MI, anterior wall (HCC) 03/13/2014     Past Medical History:   Diagnosis Date   . 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 CAD (coronary artery disease) 2016   . Cardiac LV ejection fraction 30-35%     MI 03/13/14 cath revealed EF 30-35 at that time   . HTN (hypertension)    . Hyperlipemia    . Kidney stones 05/2013     Past Surgical History:   Procedure Laterality Date   . CARDIAC CATHETERIZATION  2016    Stent to LAD   .  LITHOTRIPSY  2015   . URETER STENT PLACEMENT  2015     Family History   Problem Relation Age of Onset   . Heart Disease Father    . High Blood Pressure Father    . High Cholesterol Father      Social History     Tobacco Use   . Smoking status: Current Every Day Smoker     Packs/day: 0.50     Years: 20.00     Pack years: 10.00   . Smokeless tobacco: Former Neurosurgeon     Types: Snuff     Quit date: 05/22/1990   Substance Use Topics   . Alcohol use: No     Comment: recovering addict      Current Outpatient Medications   Medication Sig Dispense Refill   . atorvastatin (LIPITOR) 40 MG tablet Take 1 tablet by mouth once daily 90 tablet 3   . metoprolol tartrate (LOPRESSOR) 25 MG tablet TAKE 1 TABLET BY MOUTH  TWICE DAILY 180 tablet 3   . clopidogrel (PLAVIX) 75 MG tablet TAKE 1 TABLET BY MOUTH ONCE DAILY 90 tablet 3   . lisinopril (PRINIVIL;ZESTRIL) 2.5 MG tablet TAKE 1 TABLET BY MOUTH  TWICE DAILY 180  tablet 3   . nitroGLYCERIN (NITROSTAT) 0.4 MG SL tablet Place 1 tablet under the tongue every 5 minutes as needed for Chest pain (if you have to take 3 then go the ER) 25 tablet 3   . Multiple Vitamins-Minerals (THERAPEUTIC MULTIVITAMIN-MINERALS) tablet Take 1 tablet by mouth daily       No current facility-administered medications for this visit.     Allergies: Patient has no known allergies.    Review of Systems  Constitutional - no significant activity change, appetite change, or unexpected weight change. No fever, chills or diaphoresis.  No fatigue.   HEENT - no significant rhinorrhea or epistaxis. No tinnitus or significant hearing loss.   Eyes - no sudden vision change or amaurosis.   Respiratory - no significant wheezing, stridor, apnea or cough.  No dyspnea on exertion or shortness of breath.  Cardiovascular - no exertional chest pain, orthopnea or PND.  No sensation of arrhythmia or slow heart rate.   No claudication or leg edema.  Gastrointestinal - no abdominal swelling or pain. No blood in stool. No severe  constipation, diarrhea, nausea, or vomiting.   Genitourinary - no difficulty urinating, dysuria, frequency, or urgency. No flank pain or hematuria.   Musculoskeletal - no back pain, gait disturbance, or myalgia.   Skin - no color change or rash.  No pallor.  No new surgical incision.  Neurologic - no speech difficulty, facial asymmetry or lateralizing weakness.  No seizures, presyncope, syncope, or significant dizziness.  Hematologic - no easy bruising or excessive bleeding.   Psychiatric - no severe anxiety or insomnia.  No confusion.   All other review of systems are negative.      Objective  Vital Signs - BP 128/88   Pulse 55   Ht 5\' 7"  (1.702 m)   Wt 158 lb (71.7 kg)   BMI 24.75 kg/m   General - Gregory Escobar is alert, cooperative, and pleasant.  Well groomed.  No acute distress.    Body habitus is normal.  HEENT - The head is normocephalic. No circumoral cyanosis.  Dentition is normal.   EYES -  No Xanthelasma, no arcus senilis, no conjunctival hemorrhages or discharge.   Neck - Supple, without increased jugular venous pressures.  No carotid bruits.  No mass.   Respiratory - Lungs are clear bilaterally.  No wheezes or rales.  Normal effort without use of accessory muscles.  Cardiovascular - Heart has regular rhythm and rate.  No murmurs, rubs or gallops.    + pedal pulses and no varicosities.      Abdominal -  Soft, nontender, nondistended.  Bowel sounds are intact.   Extremities - No clubbing, cyanosis, or  edema.   Musculoskeletal -  No clubbing .  No Osler's nodes.   Gait normal .  No kyphosis or scoliosis.   Skin -  no statis ulcers or dermatitis.  Neurological - No focal signs are identified.  Oriented to person, place and time.    Psychiatric -  Appropriate affect and mood.       Assessment:     Diagnosis Orders   1. Coronary artery disease involving native coronary artery of native heart without angina pectoris     2. Acute MI, anterior wall (HCC)     3. Tobacco abuse     4. Mixed hyperlipidemia        Data:  BP Readings from Last 3 Encounters:   02/13/20 128/88   05/19/19 108/72   08/19/18 124/64  Pulse Readings from Last 3 Encounters:   02/13/20 55   05/19/19 54   08/19/18 57        Wt Readings from Last 3 Encounters:   02/13/20 158 lb (71.7 kg)   05/19/19 152 lb (68.9 kg)   08/19/18 148 lb (67.1 kg)     Blood pressure and heart rate controlled.  Medical management includes beta-blocker, ACE inhibitor, Plavix and statin     Reviewed recent labs  Lab Results   Component Value Date    CHOL 141 12/04/2019    CHOL 125 (L) 03/14/2014     Lab Results   Component Value Date    TRIG 67 12/04/2019    TRIG 70 (L) 03/14/2014     Lab Results   Component Value Date    HDL 40 12/04/2019    HDL 42 (L) 03/14/2014     Lab Results   Component Value Date    LDLCALC 87 12/04/2019    LDLCALC 69 03/14/2014     Lab Results   Component Value Date    LABVLDL 14 12/04/2019    LABVLDL 14 03/14/2014     Overall patient is doing well.  Reviewed his catheterization from his initial event and does have diffuse disease.  He continues to smoke.  We discussed the importance of smoking cessation.  We discussed strategies along with shortening long-term benefits.  Approximately 3 minutes was dedicated to smoking cessation counseling  We will get him set up for stress echo.  If that is okay we will see him back in 6 to 9 months.   States taking medications as prescribed  Stable cardiovascular status. No evidence of overt heart failure, angina or dysrhythmia.   30 minutes were spent preparing, reviewing and seeing patient.  All questions answered    Plan  Strongly recommend smoking cessation   Stress echo soon  Follow up in 6-9 mos Dr Urban Gibson   Call with any questions or concerns  Follow up with Gregory Escobar for non cardiac problems  Report any new problems  Cardiovascular Fitness-Exercise as tolerated.  Strive for 30 minutes of exercise most days of the week.    Cardiac / Healthy Diet  Continue current medications as directed  Continue plan of  treatment  It is always recommended that you bring your medications bottles with you to each visit - this is for your safety!       Octavia Bruckner, APRN    EMR dragon/transcription disclaimer: Much of this encounter note is electronic transcription/translation of spoken language to printed tach. Electronic translation of spoken language may be erroneous, or at times, nonsensical words or phrases may be inadvertently transcribed. Although, I have reviewed the note for such errors, some may still exist.

## 2020-03-30 ENCOUNTER — Other Ambulatory Visit: Payer: Self-pay

## 2020-03-30 ENCOUNTER — Emergency Department: Payer: 59

## 2020-03-30 ENCOUNTER — Inpatient Hospital Stay
Admission: EM | Admit: 2020-03-30 | Discharge: 2020-04-06 | DRG: 177 | Disposition: A | Discharge status: Home / Self Care | Payer: 59 | Attending: Internal Medicine | Admitting: Internal Medicine

## 2020-03-30 DIAGNOSIS — A0839 Other viral enteritis: Secondary | ICD-10-CM | POA: Diagnosis not present

## 2020-03-30 DIAGNOSIS — U071 COVID-19: Principal | ICD-10-CM | POA: Diagnosis present

## 2020-03-30 DIAGNOSIS — J9601 Acute respiratory failure with hypoxia: Secondary | ICD-10-CM | POA: Diagnosis not present

## 2020-03-30 DIAGNOSIS — R7401 Elevation of levels of liver transaminase levels: Secondary | ICD-10-CM

## 2020-03-30 DIAGNOSIS — L409 Psoriasis, unspecified: Secondary | ICD-10-CM | POA: Diagnosis not present

## 2020-03-30 DIAGNOSIS — Z79899 Other long term (current) drug therapy: Secondary | ICD-10-CM

## 2020-03-30 DIAGNOSIS — J1282 Pneumonia due to coronavirus disease 2019: Secondary | ICD-10-CM | POA: Diagnosis present

## 2020-03-30 HISTORY — DX: Other viral enteritis: A08.39

## 2020-03-30 LAB — FIBRINOGEN: Fibrinogen: 499 mg/dL — ABNORMAL HIGH (ref 210–475)

## 2020-03-30 LAB — LACTIC ACID, PLASMA
Lactic Acid, Venous: 1.5 mmol/L (ref 0.5–1.9)
Lactic Acid, Venous: 1.2 mmol/L (ref 0.5–1.9)

## 2020-03-30 LAB — PROCALCITONIN: Procalcitonin: <0.1 ng/mL

## 2020-03-30 LAB — URINALYSIS, COMPLETE (UACMP) WITH MICROSCOPIC
Bilirubin Urine: NEGATIVE
Glucose, UA: NEGATIVE mg/dL
Ketones, ur: NEGATIVE mg/dL
Leukocytes,Ua: NEGATIVE
Nitrite: NEGATIVE
Protein, ur: 100 mg/dL — AB
Specific Gravity, Urine: 1.02 (ref 1.005–1.030)
pH: 5 (ref 5.0–8.0)

## 2020-03-30 LAB — COMPREHENSIVE METABOLIC PANEL
ALT: 47 U/L — ABNORMAL HIGH (ref 0–44)
AST: 102 U/L — ABNORMAL HIGH (ref 15–41)
Albumin: 3.7 g/dL (ref 3.5–5.0)
Alkaline Phosphatase: 42 U/L (ref 38–126)
Anion gap: 13 (ref 5–15)
BUN: 11 mg/dL (ref 6–20)
CO2: 26 mmol/L (ref 22–32)
Calcium: 8.9 mg/dL (ref 8.9–10.3)
Chloride: 97 mmol/L — ABNORMAL LOW (ref 98–111)
Creatinine, Ser: 0.98 mg/dL (ref 0.61–1.24)
GFR, Estimated: >60 mL/min (ref >60)
Glucose, Bld: 109 mg/dL — ABNORMAL HIGH (ref 70–99)
Potassium: 3.5 mmol/L (ref 3.5–5.1)
Sodium: 136 mmol/L (ref 135–145)
Total Bilirubin: 0.9 mg/dL (ref 0.3–1.2)
Total Protein: 7.4 g/dL (ref 6.5–8.1)

## 2020-03-30 LAB — LACTATE DEHYDROGENASE: LDH: 516 U/L — ABNORMAL HIGH (ref 98–192)

## 2020-03-30 LAB — CBC
HCT: 50.2 % (ref 39.0–52.0)
Hemoglobin: 16.7 g/dL (ref 13.0–17.0)
MCH: 28.9 pg (ref 26.0–34.0)
MCHC: 33.3 g/dL (ref 30.0–36.0)
MCV: 87 fL (ref 80.0–100.0)
Platelets: 187 10*3/uL (ref 150–400)
RBC: 5.77 MIL/uL (ref 4.22–5.81)
RDW: 12.4 % (ref 11.5–15.5)
WBC: 8 10*3/uL (ref 4.0–10.5)
nRBC: 0 % (ref 0.0–0.2)

## 2020-03-30 LAB — FERRITIN: Ferritin: 654 ng/mL — ABNORMAL HIGH (ref 24–336)

## 2020-03-30 LAB — MAGNESIUM: Magnesium: 2.2 mg/dL (ref 1.7–2.4)

## 2020-03-30 LAB — GLUCOSE, CAPILLARY
Glucose-Capillary: 114 mg/dL — ABNORMAL HIGH (ref 70–99)
Glucose-Capillary: 151 mg/dL — ABNORMAL HIGH (ref 70–99)

## 2020-03-30 LAB — TRIGLYCERIDES: Triglycerides: 93 mg/dL (ref <150)

## 2020-03-30 LAB — TROPONIN I (HIGH SENSITIVITY)
Troponin I (High Sensitivity): 10 ng/L (ref <18)
Troponin I (High Sensitivity): 8 ng/L (ref <18)

## 2020-03-30 LAB — FIBRIN DERIVATIVES D-DIMER (ARMC ONLY): Fibrin derivatives D-dimer (ARMC): 642.48 ng/mL (FEU) — ABNORMAL HIGH (ref 0.00–499.00)

## 2020-03-30 LAB — LIPASE, BLOOD: Lipase: 26 U/L (ref 11–51)

## 2020-03-30 MED ORDER — PREDNISONE 50 MG PO TABS: 50.0000 mg | ORAL_TABLET | Freq: Every day | ORAL | Status: DC

## 2020-03-30 MED ORDER — MELATONIN 5 MG PO TABS
5.0000 mg | ORAL_TABLET | Freq: Every evening | ORAL | Status: AC | PRN
  Administered 2020-03-30 – 2020-04-03 (×5): 5 mg via ORAL
  Filled 2020-03-30 (×6): qty 1

## 2020-03-30 MED ORDER — KETOROLAC TROMETHAMINE 15 MG/ML IJ SOLN
15.0000 mg | Freq: Four times a day (QID) | INTRAMUSCULAR | Status: AC | PRN
  Filled 2020-03-30: qty 1

## 2020-03-30 MED ORDER — ONDANSETRON HCL 4 MG/2ML IJ SOLN
4.0000 mg | Freq: Four times a day (QID) | INTRAMUSCULAR | Status: DC | PRN
  Administered 2020-04-02: 09:00:00 4 mg via INTRAVENOUS
  Filled 2020-03-30: qty 2

## 2020-03-30 MED ORDER — SODIUM CHLORIDE 0.9 % IV SOLN
200.0000 mg | Freq: Once | INTRAVENOUS | Status: AC
  Administered 2020-03-30: 200 mg via INTRAVENOUS
  Filled 2020-03-30: qty 200

## 2020-03-30 MED ORDER — ENOXAPARIN SODIUM 80 MG/0.8ML ~~LOC~~ SOLN
0.5000 mg/kg | SUBCUTANEOUS | Status: DC
  Administered 2020-03-30 – 2020-04-02 (×4): 65 mg via SUBCUTANEOUS
  Filled 2020-03-30 (×5): qty 0.8

## 2020-03-30 MED ORDER — IOHEXOL 350 MG/ML SOLN
100.0000 mL | Freq: Once | INTRAVENOUS | Status: AC | PRN
  Administered 2020-03-30: 100 mL via INTRAVENOUS

## 2020-03-30 MED ORDER — SODIUM CHLORIDE 0.9 % IV SOLN
1.0000 mg/kg | Freq: Two times a day (BID) | INTRAVENOUS | Status: DC
  Administered 2020-03-30 – 2020-04-01 (×5): 130 mg via INTRAVENOUS
  Filled 2020-03-30: qty 2.08
  Filled 2020-03-30 (×4): qty 1.04
  Filled 2020-03-30: qty 2
  Filled 2020-03-30: qty 1.04
  Filled 2020-03-30: qty 2
  Filled 2020-03-30: qty 1.04

## 2020-03-30 MED ORDER — INSULIN ASPART 100 UNIT/ML ~~LOC~~ SOLN
0.0000 [IU] | Freq: Three times a day (TID) | SUBCUTANEOUS | Status: DC
  Administered 2020-04-01: 18:00:00 1 [IU] via SUBCUTANEOUS
  Administered 2020-04-02: 13:00:00 2 [IU] via SUBCUTANEOUS
  Administered 2020-04-02: 16:00:00 3 [IU] via SUBCUTANEOUS
  Administered 2020-04-03: 2 [IU] via SUBCUTANEOUS
  Administered 2020-04-03: 1 [IU] via SUBCUTANEOUS
  Administered 2020-04-03: 18:00:00 2 [IU] via SUBCUTANEOUS
  Administered 2020-04-04: 17:00:00 1 [IU] via SUBCUTANEOUS
  Filled 2020-03-30 (×6): qty 1

## 2020-03-30 MED ORDER — ACETAMINOPHEN 325 MG PO TABS: 325.0000 mg | ORAL_TABLET | Freq: Four times a day (QID) | ORAL | Status: AC | PRN

## 2020-03-30 MED ORDER — GUAIFENESIN-DM 100-10 MG/5ML PO SYRP: 10.0000 mL | ORAL_SOLUTION | ORAL | Status: DC | PRN

## 2020-03-30 MED ORDER — ONDANSETRON HCL 4 MG PO TABS: 4.0000 mg | ORAL_TABLET | Freq: Four times a day (QID) | ORAL | Status: DC | PRN

## 2020-03-30 MED ORDER — DEXAMETHASONE SODIUM PHOSPHATE 10 MG/ML IJ SOLN
6.0000 mg | Freq: Once | INTRAMUSCULAR | Status: AC
  Administered 2020-03-30: 6 mg via INTRAVENOUS
  Filled 2020-03-30: qty 1

## 2020-03-30 MED ORDER — ONDANSETRON 4 MG PO TBDP
ORAL_TABLET | ORAL | Status: AC
  Administered 2020-03-30: 4 mg via ORAL
  Filled 2020-03-30: qty 1

## 2020-03-30 MED ORDER — TRIAMCINOLONE ACETONIDE 0.1 % EX CREA
TOPICAL_CREAM | Freq: Two times a day (BID) | CUTANEOUS | Status: DC
  Administered 2020-04-05: 1 via TOPICAL
  Filled 2020-03-30 (×2): qty 15

## 2020-03-30 MED ORDER — HYDROCOD POLST-CPM POLST ER 10-8 MG/5ML PO SUER
5.0000 mL | Freq: Two times a day (BID) | ORAL | Status: DC | PRN
  Administered 2020-03-31: 5 mL via ORAL
  Filled 2020-03-30 (×2): qty 5

## 2020-03-30 MED ORDER — ONDANSETRON 4 MG PO TBDP: 4.0000 mg | ORAL_TABLET | Freq: Once | ORAL | Status: AC

## 2020-03-30 MED ORDER — SODIUM CHLORIDE 0.9 % IV SOLN
100.0000 mg | Freq: Every day | INTRAVENOUS | Status: AC
  Administered 2020-03-31 – 2020-04-03 (×4): 100 mg via INTRAVENOUS
  Filled 2020-03-30 (×4): qty 20

## 2020-03-30 MED ORDER — ACETAMINOPHEN 500 MG PO TABS
1000.0000 mg | ORAL_TABLET | Freq: Once | ORAL | Status: AC
  Administered 2020-03-30: 1000 mg via ORAL
  Filled 2020-03-30: qty 2

## 2020-03-30 NOTE — H&P (Addendum)
History and Physical   Martin Ho LOV:[id number] DOB: [birth year] DOA: 03/30/2020  PCP: Pcp, No  Patient coming from: home  I have personally briefly reviewed patient's old medical records in [place].  Chief Concern: worsening shortness of breath   HPI: Martin Ho is a 50 y.o. male with medical history significant for psoriosis on stellara injections every 12 weeks and triamcinolone topical ointment to affected area, presents to the ED for chief concern of worsening shortness of breath and generalized malaise.   He states he started feeling shortness of breath, body aches, nausea, vomiting on 03/21/20. Shortness of breath worse with laying down. He took a home covid test and tested positive for covid on 03/23/20. He has not been able to keep anything down for a week, both liquid and solids. He endorses cough with dark brown and yellow sputum. He endorses watery brown/dark yellow diarrhea for about one week. His vomitus is white/bile/or food that he attempts to eat.  He endorses fever at home, t max of 102.   Social history: lives with fiance. He denies tobacco use and recreational drug use. He endorses infrequent etoh use and last drink was wine about 1 month ago. He works in [employer].   Vaccinations: he is unvaccinated for covid infection  ROS: Constitutional: no weight change, + fever ENT/Mouth: no sore throat, + rhinorrhea Eyes: no eye pain, no vision changes Cardiovascular: no chest pain, + dyspnea,  no edema, + palpitations Respiratory: + cough, + sputum, no wheezing Gastrointestinal: + nausea, + vomiting, + diarrhea, no constipation Genitourinary: no urinary incontinence, no dysuria, no hematuria Musculoskeletal: no arthralgias, + myalgias Skin: no skin lesions, no pruritus, Neuro: + weakness, no loss of consciousness, no syncope Psych: no anxiety, no depression, + decrease appetite Heme/Lymph: no bruising, no bleeding   ED Course: Discussed with ED provider,  patient requiring hospitalization for acute hypoxic respiratory failure desating to the mid 80s on room air. He started feeling bad about 03/21/20 and took a home test on 03/23/20 and was positive for covid 19.   Heart rate and blood pressure reassuring in ED course and basic labs unremarkable. Procal negative, and elevated d-dimer. EDP ordered CTA of chest to assess for PE.  Assessment/Plan  Active Problems:   Acute hypoxemic respiratory failure due to COVID-19 [facility])   Gastroenteritis due to COVID-19 virus   Psoriasis   Acute hypoxemic respiratory failure secondary to COVID-19 infection Gastroenteritis secondary to covid 19 infection  - IV remdesivir per pharmacy, IV solumedrol 1 g/kg IV q12h initiated  - Procal was negative, therefore clinically I do not feel bacterial infection coverage is indicated at this point in time - Incentive spirometry and flutter valve for 10 reps every 2 hours while awake - Albuterol inhaler 2 puffs every 4 hours while awake - Daily labs: CMP, CBC, CRP, D-dimer - Supplemental oxygen to maintain SPO2 goal of greater than 92% - Airborne and contact precautions - Encourage PO intake as patient has covid and I do not want to volume overload him  Elevated d-dimer - EDP ordered CTA of the chest to assess for PE - We will follow this - CTA of chest to assess for PE was read as negative for PE, we will continue to dvt prophylaxis dosing at this time   Psoriasis - triamcinolone cream bid ordered  Dry mucosa of the nares - patient reports that when he blows his nose, he endorses streaks of bleeding. He denies overt nose bleeds  PRN medications: ondansetron  and acetaminophen  Chart reviewed.   [facility] collected patient's covid test sample on 03/22/20 and it resulted as positive on 03/23/20. An image of the result has been uploaded into [employer] by ED provider. ED provider and myself discussed that no further in-hospital test is indicated as this  would cause undue and unnecessary trauma and discomfort to patient's already dry mucosal nares. Additionally, no further in-hospital testing would help prevent healthcare resource waste.   DVT prophylaxis: enoxaparin Code Status: full code Diet: regular Family Communication: no Disposition Plan: pending clinical course Consults called: not indicated Admission status: observation to medsurg   History reviewed. No pertinent past medical history.  History reviewed. No pertinent surgical history.  Social History:  reports that he has never smoked. He has never used smokeless tobacco. He reports previous alcohol use. He reports previous drug use.  No Known Allergies No family history on file. Family history: Family history reviewed and not pertinent  Prior to Admission medications   Not on File   Physical Exam: Vitals:   03/30/20 1145 03/30/20 1200 03/30/20 1215 03/30/20 1300  BP:      Pulse: 91 88 90 79  Resp:      Temp:      TempSrc:      SpO2: 90% 91% 93% 97%  Weight:      Height:       Constitutional: appears age appropriate, NAD, mildly uncomfortable Eyes: PERRL, lids and conjunctivae normal ENMT: Mucous membranes are moist. Posterior pharynx clear of any exudate or lesions. Age-appropriate dentition. Hearing appropriate Neck: normal, supple, no masses, no thyromegaly Respiratory: clear to auscultation bilaterally, no wheezing, no crackles. Mild increase respiratory effort. Mild increase use accessory muscle.  Cardiovascular: Regular rate and rhythm, no murmurs / rubs / gallops. No extremity edema. 2+ pedal pulses. No carotid bruits.  Abdomen: no tenderness, no masses palpated, no hepatosplenomegaly. Bowel sounds positive.  Musculoskeletal: no clubbing / cyanosis. No joint deformity upper and lower extremities. Good ROM, no contractures, no atrophy. Normal muscle tone.  Skin: bilateral upper extremity psoriasis plaque present Neurologic: Sensation intact. Strength 5/5 in  all 4.  Psychiatric: Normal judgment and insight. Alert and oriented x 3. Normal mood.   EKG: independently reviewed, showing NSR with rate of 94, Qtc 430  Chest x-ray on Admission: I personally reviewed and I agree with radiologist reading as below.  DG Chest 2 View  Result Date: 03/30/2020 CLINICAL DATA:  Shortness of breath EXAM: CHEST - 2 VIEW COMPARISON:  None. FINDINGS: Heart size and mediastinal contours are within normal limits. Diffuse bilateral airspace opacities, most confluent at the RIGHT lung base. No pleural effusion or pneumothorax is seen. Osseous structures about the chest are unremarkable. IMPRESSION: Diffuse bilateral airspace opacities, most likely multifocal pneumonia. Electronically Signed   By: Franki Cabot M.D.   On: 03/30/2020 11:05   CT Angio Chest PE W and/or Wo Contrast  Result Date: 03/30/2020 CLINICAL DATA:  COVID positive, shortness of breath. EXAM: CT ANGIOGRAPHY CHEST WITH CONTRAST TECHNIQUE: Multidetector CT imaging of the chest was performed using the standard protocol during bolus administration of intravenous contrast. Multiplanar CT image reconstructions and MIPs were obtained to evaluate the vascular anatomy. CONTRAST:  158m OMNIPAQUE IOHEXOL 350 MG/ML SOLN COMPARISON:  Same day chest radiograph. FINDINGS: Cardiovascular: Satisfactory opacification of the pulmonary arteries to the segmental level. No evidence of pulmonary embolism. Vascular calcifications are seen in the aortic arch. Normal heart size. No pericardial effusion. Mediastinum/Nodes: Prominent/borderline enlarged bilateral  hilar and mediastinal lymph nodes are likely reactive. Thyroid gland, trachea, and esophagus demonstrate no significant findings. Lungs/Pleura: Moderate to severe patchy bilateral ground-glass opacities with a peripheral predominance are consistent with COVID-19 pneumonia. No pleural effusion or pneumothorax. Upper Abdomen: No acute abnormality. Musculoskeletal: No chest wall  abnormality. No acute or significant osseous findings. Review of the MIP images confirms the above findings. IMPRESSION: 1. No evidence of pulmonary embolism. 2. Moderate to severe patchy bilateral ground-glass opacities with a peripheral predominance are consistent with COVID-19 pneumonia. Prominent/borderline enlarged bilateral hilar and mediastinal lymph nodes are likely reactive. Aortic Atherosclerosis (ICD10-I70.0). Electronically Signed   By: Zerita Boers M.D.   On: 03/30/2020 12:42   Labs on Admission: I have personally reviewed following labs  CBC: Recent Labs  Lab 03/30/20 1038  WBC 8.0  HGB 16.7  HCT 50.2  MCV 87.0  PLT 320   Basic Metabolic Panel: Recent Labs  Lab 03/30/20 1038  NA 136  K 3.5  CL 97*  CO2 26  GLUCOSE 109*  BUN 11  CREATININE 0.98  CALCIUM 8.9  MG 2.2   GFR: Estimated Creatinine Clearance: 143.4 mL/min (by C-G formula based on SCr of 0.98 mg/dL).  Liver Function Tests: Recent Labs  Lab 03/30/20 1038  AST 102*  ALT 47*  ALKPHOS 42  BILITOT 0.9  PROT 7.4  ALBUMIN 3.7   Recent Labs  Lab 03/30/20 1038  LIPASE 26   Urine analysis:    Component Value Date/Time   COLORURINE AMBER (A) 03/30/2020 1038   APPEARANCEUR HAZY (A) 03/30/2020 1038   LABSPEC 1.020 03/30/2020 1038   PHURINE 5.0 03/30/2020 1038   GLUCOSEU NEGATIVE 03/30/2020 1038   HGBUR MODERATE (A) 03/30/2020 1038   BILIRUBINUR NEGATIVE 03/30/2020 1038   [place] 03/30/2020 1038   PROTEINUR 100 (A) 03/30/2020 1038   NITRITE NEGATIVE 03/30/2020 1038   LEUKOCYTESUR NEGATIVE 03/30/2020 1038   Caila Cirelli N Darrious Youman D.O. [facility]  If 7PM-7AM, please contact overnight-coverage provider If 7AM-7PM, please contact day coverage provider [email or web address]  03/30/2020, 1:14 PM

## 2020-03-30 NOTE — ED Notes (Signed)
Pt taken to CT via stretcher.

## 2020-03-30 NOTE — ED Provider Notes (Signed)
[facility] ([facility]) Emergency Department Provider Note  ____________________________________________   Event Date/Time   First MD Initiated Contact with Patient 03/30/20 1054     (approximate)  I have reviewed the triage vital signs and the nursing notes.   HISTORY  Chief Complaint Covid Positive, Emesis, and Abdominal Pain   HPI Martin Ho is a 50 y.o. male with a past medical history of psoriasis as well as recent diagnosis of COVID-19 at urgent care on 1/22 who presents for assessment of persistent shortness of breath, crampy abdominal pain, nonbloody nonbilious vomiting, nonbloody diarrhea, or appetite and decreased intake, myalgias, fevers, chills, fatigue and general malaise that he feels is not improving.  Patient notes he was actually symptomatic a couple days before his diagnosis released [the 20th.  He has been taking Compazine and NyQuil without much improvement in his symptoms.  He denies any tobacco abuse, EtOH use or illicit drug use.  He denies any significant cough and states he has a little bit of chest discomfort only when he is lying on his left but not otherwise.  He denies any earache, headache, sore throat, back pain, urinary symptoms rash or extremity pain.  No recent falls or injuries.         History reviewed. No pertinent past medical history.  There are no problems to display for this patient.   History reviewed. No pertinent surgical history.  Prior to Admission medications   Not on File    Allergies Patient has no known allergies.  No family history on file.  Social History Social History   Tobacco Use  . Smoking status: Never Smoker  . Smokeless tobacco: Never Used  Substance Use Topics  . Alcohol use: Not Currently  . Drug use: Not Currently    Review of Systems  Review of Systems  Constitutional: Positive for chills, fever and malaise/fatigue.  HENT: Negative for sore throat.   Eyes: Negative for pain.   Respiratory: Positive for cough ( very occasionally, nothing significant in last coulpe of days ) and shortness of breath. Negative for stridor.   Cardiovascular: Positive for chest pain ( only when laying on L side).  Gastrointestinal: Positive for abdominal pain, diarrhea, nausea and vomiting.  Genitourinary: Negative for dysuria.  Musculoskeletal: Positive for myalgias.  Skin: Negative for rash.  Neurological: Negative for seizures, loss of consciousness and headaches.  Psychiatric/Behavioral: Negative for suicidal ideas.  All other systems reviewed and are negative.     ____________________________________________   PHYSICAL EXAM:  VITAL SIGNS: ED Triage Vitals [03/30/20 1035]  Enc Vitals Group     BP 135/75     Pulse Rate (!) 101     Resp (!) 28     Temp 100.1 F (37.8 C)     Temp Source Oral     SpO2 92 %     Weight 290 lb (131.5 kg)     Height 6' 7"  (2.007 m)     Head Circumference      Peak Flow      Pain Score 7     Pain Loc      Pain Edu?      Excl. in [place]?    Vitals:   03/30/20 1200 03/30/20 1215  BP:    Pulse: 88 90  Resp:    Temp:    SpO2: 91% 93%   Physical Exam Vitals and nursing note reviewed.  Constitutional:      Appearance: He is well-developed and well-nourished.  HENT:  Head: Normocephalic and atraumatic.     Right Ear: External ear normal.     Left Ear: External ear normal.     Nose: Nose normal.     Mouth/Throat:     Mouth: Mucous membranes are dry.  Eyes:     Conjunctiva/sclera: Conjunctivae normal.  Cardiovascular:     Rate and Rhythm: Regular rhythm. Tachycardia present.     Heart sounds: No murmur heard.   Pulmonary:     Effort: Pulmonary effort is normal. Tachypnea present. No respiratory distress.     Breath sounds: Normal breath sounds.  Abdominal:     Palpations: Abdomen is soft.     Tenderness: There is no abdominal tenderness.  Musculoskeletal:        General: No edema.     Cervical back: Neck supple.  Skin:     General: Skin is warm and dry.  Neurological:     Mental Status: He is alert and oriented to person, place, and time.  Psychiatric:        Mood and Affect: Mood and affect and mood normal.      ____________________________________________   LABS (all labs ordered are listed, but only abnormal results are displayed)  Labs Reviewed  COMPREHENSIVE METABOLIC PANEL - Abnormal; Notable for the following components:      Result Value   Chloride 97 (*)    Glucose, Bld 109 (*)    AST 102 (*)    ALT 47 (*)    All other components within normal limits  URINALYSIS, COMPLETE (UACMP) WITH MICROSCOPIC - Abnormal; Notable for the following components:   Color, Urine AMBER (*)    APPearance HAZY (*)    Hgb urine dipstick MODERATE (*)    Protein, ur 100 (*)    Bacteria, UA RARE (*)    All other components within normal limits  FIBRIN DERIVATIVES D-DIMER ([facility] ONLY) - Abnormal; Notable for the following components:   Fibrin derivatives D-dimer ([facility]) 642.48 (*)    All other components within normal limits  CULTURE, BLOOD (ROUTINE X 2)  CULTURE, BLOOD (ROUTINE X 2)  LIPASE, BLOOD  CBC  LACTIC ACID, PLASMA  MAGNESIUM  LACTIC ACID, PLASMA  PROCALCITONIN  LACTATE DEHYDROGENASE  FERRITIN  TRIGLYCERIDES  FIBRINOGEN  C-REACTIVE PROTEIN  TROPONIN I (HIGH SENSITIVITY)  TROPONIN I (HIGH SENSITIVITY)      ____________________________________________  EKG  Sinus rhythm with a ventricular rate of 94, normal axis, unremarkable intervals, nonspecific ST change in V5 and V6 without any other signs of acute ischemia or other significant underlying arrhythmia.  ____________________________________________  RADIOLOGY  ED MD interpretation: Bilateral multifocal opacities consistent with pneumonia.  No pneumothorax, pulmonary edema, large effusion or other clear acute intrathoracic process.  Official radiology report(s): DG Chest 2 View  Result Date: 03/30/2020 CLINICAL DATA:  Shortness of  breath EXAM: CHEST - 2 VIEW COMPARISON:  None. FINDINGS: Heart size and mediastinal contours are within normal limits. Diffuse bilateral airspace opacities, most confluent at the RIGHT lung base. No pleural effusion or pneumothorax is seen. Osseous structures about the chest are unremarkable. IMPRESSION: Diffuse bilateral airspace opacities, most likely multifocal pneumonia. Electronically Signed   By: Franki Cabot M.D.   On: 03/30/2020 11:05    ____________________________________________   PROCEDURES  Procedure(s) performed (including Critical Care):  .1-3 Lead EKG Interpretation Performed by: Lucrezia Starch, MD Authorized by: Lucrezia Starch, MD     Interpretation: normal     ECG rate assessment: normal     Rhythm:  sinus rhythm     Ectopy: none     Conduction: normal    .Critical Care Performed by: Lucrezia Starch, MD Authorized by: Lucrezia Starch, MD   Critical care provider statement:    Critical care time (minutes):  45   Critical care time was exclusive of:  Separately billable procedures and treating other patients   Critical care was necessary to treat or prevent imminent or life-threatening deterioration of the following conditions:  Respiratory failure   Critical care was time spent personally by me on the following activities:  Discussions with consultants, evaluation of patient's response to treatment, examination of patient, ordering and performing treatments and interventions, ordering and review of laboratory studies, ordering and review of radiographic studies, pulse oximetry, re-evaluation of patient's condition, obtaining history from patient or surrogate and review of old charts     ____________________________________________   INITIAL IMPRESSION / [place] / ED COURSE        Patient presents above-stated exam for assessment of shortness of breath, nonbloody nonbilious vomiting, nonbloody diarrhea, myalgias, fatigue, fevers and decreased  appetite and malaise after recent COVID-19 diagnosis card above.  Endorses some very mild left-sided chest pain that only happens when he is lying on his left side.  On arrival patient is a temperature 100.1 and is borderline hypoxic with an SPO2 of 91% on room air and tachypneic at 28 with normal heart rate and blood pressure.  On trial of ambulation on room air patient's SPO2 did decrease to 88% improved back to 94% on 2 L nasal cannula.  Differential goes but is not limited to symptoms related to COVID-19 pneumonia gastroenteritis possibly complicated by PE, myocarditis, symptomatic anemia, ACS, metabolic derangements and superinfection with bacterial pneumonia.   ECG shows no significant arrhythmia.  CBC is unremarkable and has no leukocytosis or evidence of acute anemia.  UA obtained in triage is not consistent with infectious process.  Lipase 26 not consistent with acute pancreatitis.  CMP shows mild transaminitis which I suspect is related to patient's Covid infection and less likely from supratherapeutic Tylenol use but no other significant Metabolic derangements.  Troponin is 10 and given reassuring EKG with onset of chest pain.  3 hours prior to my interview and lab being drawn very low suspicion for ACS or myocarditis at this time.  Magnesium is WNL.  Lactic acid is less than 2.  Low suspicion for sepsis or bacteremia at this time.  D-dimer is above 500 and will plan to obtain a CTA chest to assess for evidence of PE.  Given hypoxic respiratory failure patient will be admitted to medicine service for further evaluation management.  Decadron and remdesivir ordered.   ____________________________________________   FINAL CLINICAL IMPRESSION(S) / ED DIAGNOSES  Final diagnoses:  COVID  Acute respiratory failure with hypoxia (HCC)  Transaminitis    Medications  remdesivir 200 mg in sodium chloride 0.9% 250 mL IVPB (has no administration in time range)    Followed by  remdesivir 100 mg in  sodium chloride 0.9 % 100 mL IVPB (has no administration in time range)  acetaminophen (TYLENOL) tablet 1,000 mg (1,000 mg Oral Given 03/30/20 1112)  ondansetron (ZOFRAN-ODT) disintegrating tablet 4 mg (4 mg Oral Given 03/30/20 1113)  dexamethasone (DECADRON) injection 6 mg (6 mg Intravenous Given 03/30/20 1212)     ED Discharge Orders    None       Note:  This document was prepared using Dragon voice recognition software and may include unintentional  dictation errors.   Lucrezia Starch, MD 03/30/20 [phone]

## 2020-03-30 NOTE — Progress Notes (Signed)
PHARMACIST - PHYSICIAN COMMUNICATION  CONCERNING:  Enoxaparin (Lovenox) for DVT Prophylaxis    RECOMMENDATION: Patient was prescribed enoxaprin 71m q24 hours for VTE prophylaxis.   Filed Weights   03/30/20 1035  Weight: 131.5 kg (290 lb)    Body mass index is 32.67 kg/m.  Estimated Creatinine Clearance: 143.4 mL/min (by C-G formula based on SCr of 0.98 mg/dL).   [place] enoxaparin 0.531mkg TBW SQ every 24 hours based on BMI being >30.  DESCRIPTION: [facility]  Patient is now receiving enoxaparin 65 mg every 24 hours    SaSherilyn BankerPharmD Pharmacy Resident  03/30/2020 12:47 PM

## 2020-03-30 NOTE — ED Triage Notes (Signed)
Pt states he was tested Covid+ on 1/22 and seen at the urgent care. Pt c/o continued SOb, abd pain with N/V/D, fever and chills. Pt is tachypneic on arrival

## 2020-03-30 NOTE — Consult Note (Signed)
Remdesivir - Pharmacy Brief Note   O:  ALT: 47 CXR: Diffuse bilateral airspace opacities, most likely multifocal pneumonia. SpO2: 91% on RA   A/P:  Remdesivir 200 mg IVPB once followed by 100 mg IVPB daily x 4 days.   Sherilyn Banker, PharmD Pharmacy Resident  03/30/2020 12:14 PM

## 2020-03-30 NOTE — ED Notes (Signed)
Dr. Tobie Poet at bedside.

## 2020-03-30 NOTE — ED Notes (Signed)
EDP placed pt on 2 L [place]

## 2020-03-30 NOTE — Progress Notes (Signed)
Update given to patient's fiance Baxter Flattery

## 2020-03-30 NOTE — ED Notes (Signed)
Pt ambulatory to room from xray. No distress noted.

## 2020-03-30 NOTE — Plan of Care (Signed)
  Problem: Education: Goal: Knowledge of risk factors and measures for prevention of condition will improve Outcome: Progressing   Problem: Coping: Goal: Psychosocial and spiritual needs will be supported Outcome: Progressing   Problem: Respiratory: Goal: Will maintain a patent airway Outcome: Progressing Goal: Complications related to the disease process, condition or treatment will be avoided or minimized Outcome: Progressing   Problem: Respiratory: Goal: Complications related to the disease process, condition or treatment will be avoided or minimized Outcome: Progressing

## 2020-03-31 DIAGNOSIS — J9601 Acute respiratory failure with hypoxia: Secondary | ICD-10-CM | POA: Diagnosis present

## 2020-03-31 DIAGNOSIS — J1282 Pneumonia due to coronavirus disease 2019: Secondary | ICD-10-CM | POA: Diagnosis present

## 2020-03-31 DIAGNOSIS — L409 Psoriasis, unspecified: Secondary | ICD-10-CM | POA: Diagnosis present

## 2020-03-31 DIAGNOSIS — U071 COVID-19: Secondary | ICD-10-CM | POA: Diagnosis present

## 2020-03-31 DIAGNOSIS — Z79899 Other long term (current) drug therapy: Secondary | ICD-10-CM | POA: Diagnosis not present

## 2020-03-31 DIAGNOSIS — A0839 Other viral enteritis: Secondary | ICD-10-CM | POA: Diagnosis present

## 2020-03-31 HISTORY — DX: Pneumonia due to coronavirus disease 2019: J12.82

## 2020-03-31 LAB — COMPREHENSIVE METABOLIC PANEL
ALT: 47 U/L — ABNORMAL HIGH (ref 0–44)
AST: 81 U/L — ABNORMAL HIGH (ref 15–41)
Albumin: 3.2 g/dL — ABNORMAL LOW (ref 3.5–5.0)
Alkaline Phosphatase: 42 U/L (ref 38–126)
Anion gap: 10 (ref 5–15)
BUN: 16 mg/dL (ref 6–20)
CO2: 29 mmol/L (ref 22–32)
Calcium: 8.7 mg/dL — ABNORMAL LOW (ref 8.9–10.3)
Chloride: 97 mmol/L — ABNORMAL LOW (ref 98–111)
Creatinine, Ser: 1 mg/dL (ref 0.61–1.24)
GFR, Estimated: >60 mL/min (ref >60)
Glucose, Bld: 137 mg/dL — ABNORMAL HIGH (ref 70–99)
Potassium: 3.6 mmol/L (ref 3.5–5.1)
Sodium: 136 mmol/L (ref 135–145)
Total Bilirubin: 1 mg/dL (ref 0.3–1.2)
Total Protein: 7.2 g/dL (ref 6.5–8.1)

## 2020-03-31 LAB — CBC WITH DIFFERENTIAL/PLATELET
Abs Immature Granulocytes: 0.08 10*3/uL — ABNORMAL HIGH (ref 0.00–0.07)
Basophils Absolute: 0 10*3/uL (ref 0.0–0.1)
Basophils Relative: 0 %
Eosinophils Absolute: 0 10*3/uL (ref 0.0–0.5)
Eosinophils Relative: 0 %
HCT: 47.3 % (ref 39.0–52.0)
Hemoglobin: 15.6 g/dL (ref 13.0–17.0)
Immature Granulocytes: 2 %
Lymphocytes Relative: 21 %
Lymphs Abs: 1.1 10*3/uL (ref 0.7–4.0)
MCH: 29.1 pg (ref 26.0–34.0)
MCHC: 33 g/dL (ref 30.0–36.0)
MCV: 88.1 fL (ref 80.0–100.0)
Monocytes Absolute: 0.4 10*3/uL (ref 0.1–1.0)
Monocytes Relative: 8 %
Neutro Abs: 3.5 10*3/uL (ref 1.7–7.7)
Neutrophils Relative %: 69 %
Platelets: 217 10*3/uL (ref 150–400)
RBC: 5.37 MIL/uL (ref 4.22–5.81)
RDW: 12.4 % (ref 11.5–15.5)
Smear Review: NORMAL
WBC: 5 10*3/uL (ref 4.0–10.5)
nRBC: 0 % (ref 0.0–0.2)

## 2020-03-31 LAB — FIBRIN DERIVATIVES D-DIMER (ARMC ONLY): Fibrin derivatives D-dimer (ARMC): 545.32 ng/mL (FEU) — ABNORMAL HIGH (ref 0.00–499.00)

## 2020-03-31 LAB — HIV ANTIBODY (ROUTINE TESTING W REFLEX): HIV Screen 4th Generation wRfx: NONREACTIVE

## 2020-03-31 LAB — GLUCOSE, CAPILLARY
Glucose-Capillary: 128 mg/dL — ABNORMAL HIGH (ref 70–99)
Glucose-Capillary: 109 mg/dL — ABNORMAL HIGH (ref 70–99)
Glucose-Capillary: 137 mg/dL — ABNORMAL HIGH (ref 70–99)
Glucose-Capillary: 148 mg/dL — ABNORMAL HIGH (ref 70–99)

## 2020-03-31 LAB — C-REACTIVE PROTEIN
CRP: 2.8 mg/dL — ABNORMAL HIGH (ref <1.0)
CRP: 2.1 mg/dL — ABNORMAL HIGH (ref <1.0)

## 2020-03-31 MED ORDER — ALBUTEROL SULFATE HFA 108 (90 BASE) MCG/ACT IN AERS
2.0000 | INHALATION_SPRAY | RESPIRATORY_TRACT | Status: DC
  Administered 2020-03-31 – 2020-04-06 (×35): 2 via RESPIRATORY_TRACT
  Filled 2020-03-31: qty 6.7

## 2020-03-31 NOTE — Progress Notes (Signed)
Patient ID: Martin Ho, male   DOB: [birth year], 50 y.o.   MRN: [id number]  PROGRESS NOTE    Martin Ho  SWN:[id number] DOB: 1971/01/21 DOA: 03/30/2020 PCP: Pcp, No   Brief Narrative:  50 year old male with history of psoriasis on Stelara injections presented with worsening shortness of breath.  He tested positive for Covid on 03/23/2020.  On presentation, he was saturating to the mid 80s on room air and subsequently required supplemental oxygen.  He was admitted for COVID-19 pneumonia with hypoxia  Assessment & Plan:   Acute hypoxic respiratory failure COVID-19 pneumonia Gastroenteritis secondary to COVID-19 infection -Required supplemental oxygen on presentation since he was saturating to the mid 80s on room air.  Currently on 2 L oxygen via nasal cannula -CTA chest was negative for PE but showed moderate to severe patchy bilateral groundglass opacities -Continue remdesivir and Solu-Medrol -Monitor daily inflammatory markers COVID-19 Labs  Recent Labs    03/30/20 1239 03/30/20 1518  FERRITIN 654*  --   LDH 516*  --   CRP  --  2.8*    No results found for: [place] -Continue isolation -Incentive spirometry/flutter valve/inhalers  History of psoriasis -On Stelara injections as an outpatient.  Continue triamcinolone cream outpatient follow-up with PCP/rheumatology  Mildly elevated LFTs -Probably from Covid.  Monitor   DVT prophylaxis: Lovenox Code Status: Full Family Communication: None Disposition Plan: Status is: Observation  The patient will require care spanning > 2 midnights and should be moved to inpatient because: Inpatient level of care appropriate due to severity of illness  Dispo: The patient is from: Home              Anticipated d/c is to: Home              Anticipated d/c date is: 2 days              Patient currently is not medically stable to d/c.   Difficult to place patient No   Consultants: None  Procedures: None  Antimicrobials:  None   Subjective: Patient seen and examined result.  Feels slightly better but still short of breath with exertion.  No overnight fever, nausea or vomiting reported.  Diarrhea and abdominal pain are improving.  Objective: Vitals:   03/30/20 1528 03/30/20 2034 03/31/20 0430 03/31/20 0719  BP: 138/84 123/84 126/86 125/79  Pulse: 81 83 78 76  Resp: 20 16 18 18   Temp: 98.1 F (36.7 C) 98 F (36.7 C) 98.4 F (36.9 C) 98.6 F (37 C)  TempSrc:  Oral Oral   SpO2: 95% 95% 92% 90%  Weight:      Height:       No intake or output data in the 24 hours ending 03/31/20 0840 Filed Weights   03/30/20 1035  Weight: 131.5 kg    Examination:  General exam: Appears calm and comfortable.  Currently on 2 L oxygen via nasal cannula Respiratory system: Bilateral decreased breath sounds at bases with scattered crackles Cardiovascular system: S1 & S2 heard, Rate controlled Gastrointestinal system: Abdomen is nondistended, soft and nontender. Normal bowel sounds heard. Extremities: No cyanosis, clubbing; trace lower extremity edema  Central nervous system: Alert and oriented. No focal neurological deficits. Moving extremities Skin: No rashes, lesions or ulcers Psychiatry: Judgement and insight appear normal. Mood & affect appropriate.     Data Reviewed: I have personally reviewed following labs and imaging studies  CBC: Recent Labs  Lab 03/30/20 1038 03/31/20 0515  WBC 8.0 5.0  NEUTROABS  --  3.5  HGB 16.7 15.6  HCT 50.2 47.3  MCV 87.0 88.1  PLT 187 454   Basic Metabolic Panel: Recent Labs  Lab 03/30/20 1038 03/31/20 0515  NA 136 136  K 3.5 3.6  CL 97* 97*  CO2 26 29  GLUCOSE 109* 137*  BUN 11 16  CREATININE 0.98 1.00  CALCIUM 8.9 8.7*  MG 2.2  --    GFR: Estimated Creatinine Clearance: 140.5 mL/min (by C-G formula based on SCr of 1 mg/dL). Liver Function Tests: Recent Labs  Lab 03/30/20 1038 03/31/20 0515  AST 102* 81*  ALT 47* 47*  ALKPHOS 42 42  BILITOT 0.9 1.0   PROT 7.4 7.2  ALBUMIN 3.7 3.2*   Recent Labs  Lab 03/30/20 1038  LIPASE 26   No results for input(s): AMMONIA in the last 168 hours. Coagulation Profile: No results for input(s): INR, PROTIME in the last 168 hours. Cardiac Enzymes: No results for input(s): CKTOTAL, CKMB, CKMBINDEX, TROPONINI in the last 168 hours. BNP (last 3 results) No results for input(s): PROBNP in the last 8760 hours. HbA1C: No results for input(s): HGBA1C in the last 72 hours. CBG: Recent Labs  Lab 03/30/20 1634 03/30/20 2035 03/31/20 0719  GLUCAP 114* 151* 128*   Lipid Profile: Recent Labs    03/30/20 1213  TRIG 93   Thyroid Function Tests: No results for input(s): TSH, T4TOTAL, FREET4, T3FREE, THYROIDAB in the last 72 hours. Anemia Panel: Recent Labs    03/30/20 0981  XBJYNWGN 562*   Sepsis Labs: Recent Labs  Lab 03/30/20 1114 03/30/20 1518  PROCALCITON <0.10  --   LATICACIDVEN 1.5 1.2    Recent Results (from the past 240 hour(s))  Blood Culture (routine x 2)     Status: None (Preliminary result)   Collection Time: 03/30/20 12:13 PM   Specimen: BLOOD  Result Value Ref Range Status   Specimen Description BLOOD BLOOD LEFT FOREARM  Final   Special Requests   Final    BOTTLES DRAWN AEROBIC AND ANAEROBIC Blood Culture adequate volume   Culture   Final    NO GROWTH < 24 HOURS Performed at [facility], [address] [place], [place] [address]    Report Status PENDING  Incomplete  Blood Culture (routine x 2)     Status: None (Preliminary result)   Collection Time: 03/30/20 12:18 PM   Specimen: BLOOD  Result Value Ref Range Status   Specimen Description BLOOD BLOOD RIGHT HAND  Final   Special Requests   Final    BOTTLES DRAWN AEROBIC AND ANAEROBIC Blood Culture adequate volume   Culture   Final    NO GROWTH < 24 HOURS Performed at [facility], [address], [place], [place] [address]    Report Status PENDING  Incomplete         Radiology  Studies: DG Chest 2 View  Result Date: 03/30/2020 CLINICAL DATA:  Shortness of breath EXAM: CHEST - 2 VIEW COMPARISON:  None. FINDINGS: Heart size and mediastinal contours are within normal limits. Diffuse bilateral airspace opacities, most confluent at the RIGHT lung base. No pleural effusion or pneumothorax is seen. Osseous structures about the chest are unremarkable. IMPRESSION: Diffuse bilateral airspace opacities, most likely multifocal pneumonia. Electronically Signed   By: Franki Cabot M.D.   On: 03/30/2020 11:05   CT Angio Chest PE W and/or Wo Contrast  Result Date: 03/30/2020 CLINICAL DATA:  COVID positive, shortness of breath. EXAM: CT ANGIOGRAPHY CHEST WITH CONTRAST TECHNIQUE: Multidetector CT  imaging of the chest was performed using the standard protocol during bolus administration of intravenous contrast. Multiplanar CT image reconstructions and MIPs were obtained to evaluate the vascular anatomy. CONTRAST:  134m OMNIPAQUE IOHEXOL 350 MG/ML SOLN COMPARISON:  Same day chest radiograph. FINDINGS: Cardiovascular: Satisfactory opacification of the pulmonary arteries to the segmental level. No evidence of pulmonary embolism. Vascular calcifications are seen in the aortic arch. Normal heart size. No pericardial effusion. Mediastinum/Nodes: Prominent/borderline enlarged bilateral hilar and mediastinal lymph nodes are likely reactive. Thyroid gland, trachea, and esophagus demonstrate no significant findings. Lungs/Pleura: Moderate to severe patchy bilateral ground-glass opacities with a peripheral predominance are consistent with COVID-19 pneumonia. No pleural effusion or pneumothorax. Upper Abdomen: No acute abnormality. Musculoskeletal: No chest wall abnormality. No acute or significant osseous findings. Review of the MIP images confirms the above findings. IMPRESSION: 1. No evidence of pulmonary embolism. 2. Moderate to severe patchy bilateral ground-glass opacities with a peripheral predominance  are consistent with COVID-19 pneumonia. Prominent/borderline enlarged bilateral hilar and mediastinal lymph nodes are likely reactive. Aortic Atherosclerosis (ICD10-I70.0). Electronically Signed   By: TZerita BoersM.D.   On: 03/30/2020 12:42        Scheduled Meds: . enoxaparin (LOVENOX) injection  0.5 mg/kg Subcutaneous Q24H  . insulin aspart  0-9 Units Subcutaneous TID WC  . [START ON 04/03/2020] predniSONE  50 mg Oral Daily  . triamcinolone   Topical BID   Continuous Infusions: . methylPREDNISolone (SOLU-MEDROL) injection 130 mg (03/30/20 2206)  . remdesivir 100 mg in NS 100 mL            KAline August MD [facility] 03/31/2020, 8:40 AM

## 2020-04-01 DIAGNOSIS — A0839 Other viral enteritis: Secondary | ICD-10-CM | POA: Diagnosis not present

## 2020-04-01 DIAGNOSIS — U071 COVID-19: Secondary | ICD-10-CM | POA: Diagnosis not present

## 2020-04-01 DIAGNOSIS — L409 Psoriasis, unspecified: Secondary | ICD-10-CM | POA: Diagnosis not present

## 2020-04-01 DIAGNOSIS — J9601 Acute respiratory failure with hypoxia: Secondary | ICD-10-CM | POA: Diagnosis not present

## 2020-04-01 DIAGNOSIS — J1282 Pneumonia due to coronavirus disease 2019: Secondary | ICD-10-CM

## 2020-04-01 LAB — CBC WITH DIFFERENTIAL/PLATELET
Abs Immature Granulocytes: 0.13 10*3/uL — ABNORMAL HIGH (ref 0.00–0.07)
Basophils Absolute: 0 10*3/uL (ref 0.0–0.1)
Basophils Relative: 0 %
Eosinophils Absolute: 0 10*3/uL (ref 0.0–0.5)
Eosinophils Relative: 0 %
HCT: 47.6 % (ref 39.0–52.0)
Hemoglobin: 15.3 g/dL (ref 13.0–17.0)
Immature Granulocytes: 1 %
Lymphocytes Relative: 15 %
Lymphs Abs: 1.5 10*3/uL (ref 0.7–4.0)
MCH: 28.7 pg (ref 26.0–34.0)
MCHC: 32.1 g/dL (ref 30.0–36.0)
MCV: 89.3 fL (ref 80.0–100.0)
Monocytes Absolute: 1 10*3/uL (ref 0.1–1.0)
Monocytes Relative: 10 %
Neutro Abs: 7.5 10*3/uL (ref 1.7–7.7)
Neutrophils Relative %: 74 %
Platelets: 273 10*3/uL (ref 150–400)
RBC: 5.33 MIL/uL (ref 4.22–5.81)
RDW: 12.5 % (ref 11.5–15.5)
Smear Review: NORMAL
WBC: 10.2 10*3/uL (ref 4.0–10.5)
nRBC: 0 % (ref 0.0–0.2)

## 2020-04-01 LAB — COMPREHENSIVE METABOLIC PANEL
ALT: 46 U/L — ABNORMAL HIGH (ref 0–44)
AST: 63 U/L — ABNORMAL HIGH (ref 15–41)
Albumin: 3.1 g/dL — ABNORMAL LOW (ref 3.5–5.0)
Alkaline Phosphatase: 39 U/L (ref 38–126)
Anion gap: 12 (ref 5–15)
BUN: 19 mg/dL (ref 6–20)
CO2: 26 mmol/L (ref 22–32)
Calcium: 8.9 mg/dL (ref 8.9–10.3)
Chloride: 98 mmol/L (ref 98–111)
Creatinine, Ser: 0.91 mg/dL (ref 0.61–1.24)
GFR, Estimated: >60 mL/min (ref >60)
Glucose, Bld: 135 mg/dL — ABNORMAL HIGH (ref 70–99)
Potassium: 3.9 mmol/L (ref 3.5–5.1)
Sodium: 136 mmol/L (ref 135–145)
Total Bilirubin: 0.8 mg/dL (ref 0.3–1.2)
Total Protein: 6.8 g/dL (ref 6.5–8.1)

## 2020-04-01 LAB — FIBRIN DERIVATIVES D-DIMER (ARMC ONLY): Fibrin derivatives D-dimer (ARMC): 429.82 ng/mL (FEU) (ref 0.00–499.00)

## 2020-04-01 LAB — C-REACTIVE PROTEIN: CRP: 0.5 mg/dL (ref <1.0)

## 2020-04-01 LAB — MAGNESIUM: Magnesium: 2.2 mg/dL (ref 1.7–2.4)

## 2020-04-01 LAB — GLUCOSE, CAPILLARY
Glucose-Capillary: 143 mg/dL — ABNORMAL HIGH (ref 70–99)
Glucose-Capillary: 148 mg/dL — ABNORMAL HIGH (ref 70–99)

## 2020-04-01 MED ORDER — FUROSEMIDE 10 MG/ML IJ SOLN
60.0000 mg | Freq: Once | INTRAMUSCULAR | Status: AC
  Administered 2020-04-01: 10:00:00 60 mg via INTRAVENOUS
  Filled 2020-04-01: qty 6

## 2020-04-01 MED ORDER — BARICITINIB 2 MG PO TABS
4.0000 mg | ORAL_TABLET | Freq: Every day | ORAL | Status: DC
  Administered 2020-04-01 – 2020-04-06 (×6): 4 mg via ORAL
  Filled 2020-04-01 (×6): qty 2

## 2020-04-01 NOTE — Progress Notes (Signed)
Patient ID: Martin Ho, male   DOB: [birth year], 50 y.o.   MRN: [id number]  PROGRESS NOTE    Suhayb Anzalone  PPI:[id number] DOB: Dec 09, 1970 DOA: 03/30/2020 PCP: Pcp, No   Brief Narrative:  50 year old male with history of psoriasis on Stelara injections presented with worsening shortness of breath.  He tested positive for Covid on 03/23/2020.  On presentation, he was saturating to the mid 80s on room air and subsequently required supplemental oxygen.  He was admitted for COVID-19 pneumonia with hypoxia and started on remdesivir and Solu-Medrol.  Assessment & Plan:   Acute hypoxic respiratory failure COVID-19 pneumonia Gastroenteritis secondary to COVID-19 infection -Required supplemental oxygen on presentation since he was saturating to the mid 80s on room air.   -Respiratory status worsened.  Currently requiring 6 L oxygen via nasal cannula. -CTA chest was negative for PE but showed moderate to severe patchy bilateral groundglass opacities -Continue remdesivir and Solu-Medrol.  Will also start baricitinib.  Will give IV Lasix to maintain negative balance. -Monitor daily inflammatory markers COVID-19 Labs  Recent Labs    03/30/20 1239 03/30/20 1518 03/31/20 0515  FERRITIN 654*  --   --   LDH 516*  --   --   CRP  --  2.8* 2.1*    No results found for: [place] -Continue isolation -Incentive spirometry/flutter valve/inhalers  History of psoriasis -On Stelara injections as an outpatient.  Continue triamcinolone cream outpatient follow-up with PCP/rheumatology  Mildly elevated LFTs -Probably from Covid.  Improving.  Monitor   DVT prophylaxis: Lovenox Code Status: Full Family Communication: None Disposition Plan: Status is: Inpatient because: Inpatient level of care appropriate due to severity of illness  Dispo: The patient is from: Home              Anticipated d/c is to: Home              Anticipated d/c date is: 3 days              Patient currently is not medically  stable to d/c.   Difficult to place patient No   Consultants: None  Procedures: None  Antimicrobials: None   Subjective: Patient seen and examined result.  Denies overnight fever, vomiting, abdominal pain or diarrhea.  Short of breath with minimal exertion.   Objective: Vitals:   03/31/20 2006 04/01/20 0000 04/01/20 0155 04/01/20 0447  BP: 139/77  121/71 129/77  Pulse: 87  80 77  Resp: 18  (!) 24 16  Temp: 98.3 F (36.8 C)  97.8 F (36.6 C) 97.9 F (36.6 C)  TempSrc: Oral  Oral Oral  SpO2: 91% 90% 92% 93%  Weight:      Height:        Intake/Output Summary (Last 24 hours) at 04/01/2020 0720 Last data filed at 04/01/2020 0409 Gross per 24 hour  Intake --  Output 400 ml  Net -400 ml   Filed Weights   03/30/20 1035  Weight: 131.5 kg    Examination:  General exam: No acute distress.  Currently on 6 L oxygen via nasal cannula Respiratory system: Decreased breath sounds at bases bilaterally with some crackles, intermittent tachypnea Cardiovascular system: Rate controlled, S1-S2 heard  gastrointestinal system: Abdomen is nondistended, soft and nontender.  Bowel sounds are heard  extremities: Mild lower extremity edema present; no clubbing Central nervous system: Awake and alert.  No focal neurological deficits.  Moves extremities Skin: No obvious ecchymosis/lesions Psychiatry: Flat affect    Data Reviewed: I have personally reviewed following  labs and imaging studies  CBC: Recent Labs  Lab 03/30/20 1038 03/31/20 0515 04/01/20 0456  WBC 8.0 5.0 10.2  NEUTROABS  --  3.5 7.5  HGB 16.7 15.6 15.3  HCT 50.2 47.3 47.6  MCV 87.0 88.1 89.3  PLT 187 217 423   Basic Metabolic Panel: Recent Labs  Lab 03/30/20 1038 03/31/20 0515 04/01/20 0456  NA 136 136 136  K 3.5 3.6 3.9  CL 97* 97* 98  CO2 26 29 26   GLUCOSE 109* 137* 135*  BUN 11 16 19   CREATININE 0.98 1.00 0.91  CALCIUM 8.9 8.7* 8.9  MG 2.2  --  2.2   GFR: Estimated Creatinine Clearance: 154.4 mL/min  (by C-G formula based on SCr of 0.91 mg/dL). Liver Function Tests: Recent Labs  Lab 03/30/20 1038 03/31/20 0515 04/01/20 0456  AST 102* 81* 63*  ALT 47* 47* 46*  ALKPHOS 42 42 39  BILITOT 0.9 1.0 0.8  PROT 7.4 7.2 6.8  ALBUMIN 3.7 3.2* 3.1*   Recent Labs  Lab 03/30/20 1038  LIPASE 26   No results for input(s): AMMONIA in the last 168 hours. Coagulation Profile: No results for input(s): INR, PROTIME in the last 168 hours. Cardiac Enzymes: No results for input(s): CKTOTAL, CKMB, CKMBINDEX, TROPONINI in the last 168 hours. BNP (last 3 results) No results for input(s): PROBNP in the last 8760 hours. HbA1C: No results for input(s): HGBA1C in the last 72 hours. CBG: Recent Labs  Lab 03/30/20 2035 03/31/20 0719 03/31/20 1212 03/31/20 1636 03/31/20 2056  GLUCAP 151* 128* 109* 137* 148*   Lipid Profile: Recent Labs    03/30/20 1213  TRIG 93   Thyroid Function Tests: No results for input(s): TSH, T4TOTAL, FREET4, T3FREE, THYROIDAB in the last 72 hours. Anemia Panel: Recent Labs    03/30/20 5361  WERXVQMG 867*   Sepsis Labs: Recent Labs  Lab 03/30/20 1114 03/30/20 1518  PROCALCITON <0.10  --   LATICACIDVEN 1.5 1.2    Recent Results (from the past 240 hour(s))  Blood Culture (routine x 2)     Status: None (Preliminary result)   Collection Time: 03/30/20 12:13 PM   Specimen: BLOOD  Result Value Ref Range Status   Specimen Description BLOOD BLOOD LEFT FOREARM  Final   Special Requests   Final    BOTTLES DRAWN AEROBIC AND ANAEROBIC Blood Culture adequate volume   Culture   Final    NO GROWTH < 24 HOURS Performed at [facility], [address] [place], [place] [address]    Report Status PENDING  Incomplete  Blood Culture (routine x 2)     Status: None (Preliminary result)   Collection Time: 03/30/20 12:18 PM   Specimen: BLOOD  Result Value Ref Range Status   Specimen Description BLOOD BLOOD RIGHT HAND  Final   Special Requests   Final     BOTTLES DRAWN AEROBIC AND ANAEROBIC Blood Culture adequate volume   Culture   Final    NO GROWTH < 24 HOURS Performed at [facility], [address], [place], [place] [address]    Report Status PENDING  Incomplete         Radiology Studies: DG Chest 2 View  Result Date: 03/30/2020 CLINICAL DATA:  Shortness of breath EXAM: CHEST - 2 VIEW COMPARISON:  None. FINDINGS: Heart size and mediastinal contours are within normal limits. Diffuse bilateral airspace opacities, most confluent at the RIGHT lung base. No pleural effusion or pneumothorax is seen. Osseous structures about the chest  are unremarkable. IMPRESSION: Diffuse bilateral airspace opacities, most likely multifocal pneumonia. Electronically Signed   By: Franki Cabot M.D.   On: 03/30/2020 11:05   CT Angio Chest PE W and/or Wo Contrast  Result Date: 03/30/2020 CLINICAL DATA:  COVID positive, shortness of breath. EXAM: CT ANGIOGRAPHY CHEST WITH CONTRAST TECHNIQUE: Multidetector CT imaging of the chest was performed using the standard protocol during bolus administration of intravenous contrast. Multiplanar CT image reconstructions and MIPs were obtained to evaluate the vascular anatomy. CONTRAST:  136m OMNIPAQUE IOHEXOL 350 MG/ML SOLN COMPARISON:  Same day chest radiograph. FINDINGS: Cardiovascular: Satisfactory opacification of the pulmonary arteries to the segmental level. No evidence of pulmonary embolism. Vascular calcifications are seen in the aortic arch. Normal heart size. No pericardial effusion. Mediastinum/Nodes: Prominent/borderline enlarged bilateral hilar and mediastinal lymph nodes are likely reactive. Thyroid gland, trachea, and esophagus demonstrate no significant findings. Lungs/Pleura: Moderate to severe patchy bilateral ground-glass opacities with a peripheral predominance are consistent with COVID-19 pneumonia. No pleural effusion or pneumothorax. Upper Abdomen: No acute abnormality. Musculoskeletal: No chest wall  abnormality. No acute or significant osseous findings. Review of the MIP images confirms the above findings. IMPRESSION: 1. No evidence of pulmonary embolism. 2. Moderate to severe patchy bilateral ground-glass opacities with a peripheral predominance are consistent with COVID-19 pneumonia. Prominent/borderline enlarged bilateral hilar and mediastinal lymph nodes are likely reactive. Aortic Atherosclerosis (ICD10-I70.0). Electronically Signed   By: TZerita BoersM.D.   On: 03/30/2020 12:42        Scheduled Meds: . albuterol  2 puff Inhalation Q4H  . enoxaparin (LOVENOX) injection  0.5 mg/kg Subcutaneous Q24H  . insulin aspart  0-9 Units Subcutaneous TID WC  . [START ON 04/03/2020] predniSONE  50 mg Oral Daily  . triamcinolone   Topical BID   Continuous Infusions: . methylPREDNISolone (SOLU-MEDROL) injection 130 mg (03/31/20 2152)  . remdesivir 100 mg in NS 100 mL 100 mg (03/31/20 1138)          KAline August MD [facility] 04/01/2020, 7:20 AM

## 2020-04-02 DIAGNOSIS — J9601 Acute respiratory failure with hypoxia: Secondary | ICD-10-CM | POA: Diagnosis not present

## 2020-04-02 DIAGNOSIS — A0839 Other viral enteritis: Secondary | ICD-10-CM | POA: Diagnosis not present

## 2020-04-02 DIAGNOSIS — U071 COVID-19: Secondary | ICD-10-CM | POA: Diagnosis not present

## 2020-04-02 DIAGNOSIS — L409 Psoriasis, unspecified: Secondary | ICD-10-CM | POA: Diagnosis not present

## 2020-04-02 LAB — GLUCOSE, CAPILLARY
Glucose-Capillary: 111 mg/dL — ABNORMAL HIGH (ref 70–99)
Glucose-Capillary: 168 mg/dL — ABNORMAL HIGH (ref 70–99)
Glucose-Capillary: 202 mg/dL — ABNORMAL HIGH (ref 70–99)
Glucose-Capillary: 160 mg/dL — ABNORMAL HIGH (ref 70–99)

## 2020-04-02 LAB — CBC WITH DIFFERENTIAL/PLATELET
Abs Immature Granulocytes: 0.17 10*3/uL — ABNORMAL HIGH (ref 0.00–0.07)
Basophils Absolute: 0 10*3/uL (ref 0.0–0.1)
Basophils Relative: 0 %
Eosinophils Absolute: 0 10*3/uL (ref 0.0–0.5)
Eosinophils Relative: 0 %
HCT: 48.9 % (ref 39.0–52.0)
Hemoglobin: 16.3 g/dL (ref 13.0–17.0)
Immature Granulocytes: 1 %
Lymphocytes Relative: 14 %
Lymphs Abs: 1.6 10*3/uL (ref 0.7–4.0)
MCH: 29.7 pg (ref 26.0–34.0)
MCHC: 33.3 g/dL (ref 30.0–36.0)
MCV: 89.1 fL (ref 80.0–100.0)
Monocytes Absolute: 1.5 10*3/uL — ABNORMAL HIGH (ref 0.1–1.0)
Monocytes Relative: 12 %
Neutro Abs: 8.5 10*3/uL — ABNORMAL HIGH (ref 1.7–7.7)
Neutrophils Relative %: 73 %
Platelets: 352 10*3/uL (ref 150–400)
RBC: 5.49 MIL/uL (ref 4.22–5.81)
RDW: 12.3 % (ref 11.5–15.5)
WBC: 11.7 10*3/uL — ABNORMAL HIGH (ref 4.0–10.5)
nRBC: 0 % (ref 0.0–0.2)

## 2020-04-02 LAB — COMPREHENSIVE METABOLIC PANEL
ALT: 52 U/L — ABNORMAL HIGH (ref 0–44)
AST: 58 U/L — ABNORMAL HIGH (ref 15–41)
Albumin: 3.3 g/dL — ABNORMAL LOW (ref 3.5–5.0)
Alkaline Phosphatase: 41 U/L (ref 38–126)
Anion gap: 11 (ref 5–15)
BUN: 23 mg/dL — ABNORMAL HIGH (ref 6–20)
CO2: 30 mmol/L (ref 22–32)
Calcium: 8.9 mg/dL (ref 8.9–10.3)
Chloride: 97 mmol/L — ABNORMAL LOW (ref 98–111)
Creatinine, Ser: 0.91 mg/dL (ref 0.61–1.24)
GFR, Estimated: >60 mL/min (ref >60)
Glucose, Bld: 117 mg/dL — ABNORMAL HIGH (ref 70–99)
Potassium: 4 mmol/L (ref 3.5–5.1)
Sodium: 138 mmol/L (ref 135–145)
Total Bilirubin: 0.8 mg/dL (ref 0.3–1.2)
Total Protein: 6.9 g/dL (ref 6.5–8.1)

## 2020-04-02 LAB — MAGNESIUM: Magnesium: 2.6 mg/dL — ABNORMAL HIGH (ref 1.7–2.4)

## 2020-04-02 LAB — FIBRIN DERIVATIVES D-DIMER (ARMC ONLY): Fibrin derivatives D-dimer (ARMC): 307.43 ng/mL (FEU) (ref 0.00–499.00)

## 2020-04-02 LAB — C-REACTIVE PROTEIN: CRP: 0.5 mg/dL (ref <1.0)

## 2020-04-02 MED ORDER — METHYLPREDNISOLONE SODIUM SUCC 125 MG IJ SOLR
80.0000 mg | Freq: Two times a day (BID) | INTRAMUSCULAR | Status: DC
  Administered 2020-04-02 – 2020-04-04 (×5): 80 mg via INTRAVENOUS
  Filled 2020-04-02 (×5): qty 2

## 2020-04-02 MED ORDER — FUROSEMIDE 10 MG/ML IJ SOLN
60.0000 mg | Freq: Once | INTRAMUSCULAR | Status: AC
  Administered 2020-04-02: 60 mg via INTRAVENOUS
  Filled 2020-04-02: qty 6

## 2020-04-02 NOTE — Progress Notes (Signed)
Patient ID: Martin Ho, male   DOB: 1970/10/18, 50 y.o.   MRN: [id number]  PROGRESS NOTE    Martin Ho  NUU:[id number] DOB: 01-14-1971 DOA: 03/30/2020 PCP: Pcp, No   Brief Narrative:  50 year old male with history of psoriasis on Stelara injections presented with worsening shortness of breath.  He tested positive for Covid on 03/23/2020.  On presentation, he was saturating to the mid 80s on room air and subsequently required supplemental oxygen.  He was admitted for COVID-19 pneumonia with hypoxia and started on remdesivir and Solu-Medrol.  Baricitinib was also started because of worsening respiratory status.  Assessment & Plan:   Acute hypoxic respiratory failure COVID-19 pneumonia Gastroenteritis secondary to COVID-19 infection -Required supplemental oxygen on presentation since he was saturating to the mid 80s on room air.   -Currently still requiring 6 L oxygen via nasal cannula. -CTA chest was negative for PE but showed moderate to severe patchy bilateral groundglass opacities -Continue remdesivir, baricitinib and Solu-Medrol. Will give IV Lasix again today to maintain negative balance. -Monitor daily inflammatory markers: CRP improved to 0.5 on 04/01/2020.  CRP pending for today COVID-19 Labs  Recent Labs    03/30/20 1239 03/30/20 1518 03/31/20 0515 04/01/20 0456  FERRITIN 654*  --   --   --   LDH 516*  --   --   --   CRP  --  2.8* 2.1* 0.5    No results found for: [place] -Continue isolation -Incentive spirometry/flutter valve/inhalers  History of psoriasis -On Stelara injections as an outpatient.  Continue triamcinolone cream outpatient follow-up with PCP/rheumatology  Mildly elevated LFTs -Probably from Covid.  Improving.  Monitor   DVT prophylaxis: Lovenox Code Status: Full Family Communication: None Disposition Plan: Status is: Inpatient because: Inpatient level of care appropriate due to severity of illness  Dispo: The patient is from: Home               Anticipated d/c is to: Home              Anticipated d/c date is: 3 days              Patient currently is not medically stable to d/c.   Difficult to place patient No   Consultants: None  Procedures: None  Antimicrobials: None   Subjective: Patient seen and examined result.  Denies worsening chest pain, abdominal pain, nausea vomiting.  Still short of breath with minimal exertion along with dry cough.  Objective: Vitals:   04/01/20 1628 04/01/20 1955 04/01/20 2312 04/02/20 0450  BP: 125/74 125/74 130/71   Pulse: 86  75   Resp: 16  16   Temp: 98.5 F (36.9 C) 98.3 F (36.8 C) 98.5 F (36.9 C)   TempSrc: Oral Oral    SpO2: 93% 96% 91%   Weight:    (!) 139 kg  Height:        Intake/Output Summary (Last 24 hours) at 04/02/2020 0722 Last data filed at 04/02/2020 0455 Gross per 24 hour  Intake --  Output 600 ml  Net -600 ml   Filed Weights   03/30/20 1035 04/02/20 0450  Weight: 131.5 kg (!) 139 kg    Examination:  General exam: Currently still on 6 L oxygen via nasal cannula.  No acute distress Respiratory system: Bilateral decreased breath sounds at bases with scattered crackles cardiovascular system: S1-S2 heard, rate controlled gastrointestinal system: Abdomen is nondistended, soft and nontender.  Normal bowel sounds are heard  extremities: No cyanosis.  Trace lower  extremity edema present Central nervous system: Alert and oriented.  No focal neurological deficits.  Moving extremities  skin: No obvious lesions/rashes Psychiatry: Affect is flat   Data Reviewed: I have personally reviewed following labs and imaging studies  CBC: Recent Labs  Lab 03/30/20 1038 03/31/20 0515 04/01/20 0456  WBC 8.0 5.0 10.2  NEUTROABS  --  3.5 7.5  HGB 16.7 15.6 15.3  HCT 50.2 47.3 47.6  MCV 87.0 88.1 89.3  PLT 187 217 665   Basic Metabolic Panel: Recent Labs  Lab 03/30/20 1038 03/31/20 0515 04/01/20 0456  NA 136 136 136  K 3.5 3.6 3.9  CL 97* 97* 98  CO2 26 29 26    GLUCOSE 109* 137* 135*  BUN 11 16 19   CREATININE 0.98 1.00 0.91  CALCIUM 8.9 8.7* 8.9  MG 2.2  --  2.2   GFR: Estimated Creatinine Clearance: 158.7 mL/min (by C-G formula based on SCr of 0.91 mg/dL). Liver Function Tests: Recent Labs  Lab 03/30/20 1038 03/31/20 0515 04/01/20 0456  AST 102* 81* 63*  ALT 47* 47* 46*  ALKPHOS 42 42 39  BILITOT 0.9 1.0 0.8  PROT 7.4 7.2 6.8  ALBUMIN 3.7 3.2* 3.1*   Recent Labs  Lab 03/30/20 1038  LIPASE 26   No results for input(s): AMMONIA in the last 168 hours. Coagulation Profile: No results for input(s): INR, PROTIME in the last 168 hours. Cardiac Enzymes: No results for input(s): CKTOTAL, CKMB, CKMBINDEX, TROPONINI in the last 168 hours. BNP (last 3 results) No results for input(s): PROBNP in the last 8760 hours. HbA1C: No results for input(s): HGBA1C in the last 72 hours. CBG: Recent Labs  Lab 03/31/20 1212 03/31/20 1636 03/31/20 2056 04/01/20 1627 04/01/20 2119  GLUCAP 109* 137* 148* 143* 148*   Lipid Profile: Recent Labs    03/30/20 1213  TRIG 93   Thyroid Function Tests: No results for input(s): TSH, T4TOTAL, FREET4, T3FREE, THYROIDAB in the last 72 hours. Anemia Panel: Recent Labs    03/30/20 9935  TSVXBLTJ 030*   Sepsis Labs: Recent Labs  Lab 03/30/20 1114 03/30/20 1518  PROCALCITON <0.10  --   LATICACIDVEN 1.5 1.2    Recent Results (from the past 240 hour(s))  Blood Culture (routine x 2)     Status: None (Preliminary result)   Collection Time: 03/30/20 12:13 PM   Specimen: BLOOD  Result Value Ref Range Status   Specimen Description BLOOD BLOOD LEFT FOREARM  Final   Special Requests   Final    BOTTLES DRAWN AEROBIC AND ANAEROBIC Blood Culture adequate volume   Culture   Final    NO GROWTH 2 DAYS Performed at [facility], [address], [place], [place] [address]    Report Status PENDING  Incomplete  Blood Culture (routine x 2)     Status: None (Preliminary result)   Collection  Time: 03/30/20 12:18 PM   Specimen: BLOOD  Result Value Ref Range Status   Specimen Description BLOOD BLOOD RIGHT HAND  Final   Special Requests   Final    BOTTLES DRAWN AEROBIC AND ANAEROBIC Blood Culture adequate volume   Culture   Final    NO GROWTH 2 DAYS Performed at [facility], [address], [place], [place] [address]    Report Status PENDING  Incomplete         Radiology Studies: No results found.      Scheduled Meds: . albuterol  2 puff Inhalation Q4H  . baricitinib  4  mg Oral Daily  . enoxaparin (LOVENOX) injection  0.5 mg/kg Subcutaneous Q24H  . insulin aspart  0-9 Units Subcutaneous TID WC  . [START ON 04/03/2020] predniSONE  50 mg Oral Daily  . triamcinolone   Topical BID   Continuous Infusions: . methylPREDNISolone (SOLU-MEDROL) injection 130 mg (04/01/20 2032)  . remdesivir 100 mg in NS 100 mL 100 mg (04/01/20 0948)          Aline August, MD [facility] 04/02/2020, 7:22 AM

## 2020-04-03 DIAGNOSIS — U071 COVID-19: Secondary | ICD-10-CM | POA: Diagnosis not present

## 2020-04-03 DIAGNOSIS — J9601 Acute respiratory failure with hypoxia: Secondary | ICD-10-CM | POA: Diagnosis not present

## 2020-04-03 DIAGNOSIS — J1282 Pneumonia due to coronavirus disease 2019: Secondary | ICD-10-CM | POA: Diagnosis not present

## 2020-04-03 LAB — COMPREHENSIVE METABOLIC PANEL
ALT: 55 U/L — ABNORMAL HIGH (ref 0–44)
AST: 59 U/L — ABNORMAL HIGH (ref 15–41)
Albumin: 3.4 g/dL — ABNORMAL LOW (ref 3.5–5.0)
Alkaline Phosphatase: 38 U/L (ref 38–126)
Anion gap: 10 (ref 5–15)
BUN: 22 mg/dL — ABNORMAL HIGH (ref 6–20)
CO2: 29 mmol/L (ref 22–32)
Calcium: 8.6 mg/dL — ABNORMAL LOW (ref 8.9–10.3)
Chloride: 98 mmol/L (ref 98–111)
Creatinine, Ser: 0.92 mg/dL (ref 0.61–1.24)
GFR, Estimated: >60 mL/min (ref >60)
Glucose, Bld: 129 mg/dL — ABNORMAL HIGH (ref 70–99)
Potassium: 3.8 mmol/L (ref 3.5–5.1)
Sodium: 137 mmol/L (ref 135–145)
Total Bilirubin: 0.9 mg/dL (ref 0.3–1.2)
Total Protein: 6.8 g/dL (ref 6.5–8.1)

## 2020-04-03 LAB — CBC WITH DIFFERENTIAL/PLATELET
Abs Immature Granulocytes: 0.22 10*3/uL — ABNORMAL HIGH (ref 0.00–0.07)
Basophils Absolute: 0 10*3/uL (ref 0.0–0.1)
Basophils Relative: 0 %
Eosinophils Absolute: 0 10*3/uL (ref 0.0–0.5)
Eosinophils Relative: 0 %
HCT: 47 % (ref 39.0–52.0)
Hemoglobin: 15.3 g/dL (ref 13.0–17.0)
Immature Granulocytes: 2 %
Lymphocytes Relative: 10 %
Lymphs Abs: 1.3 10*3/uL (ref 0.7–4.0)
MCH: 28.8 pg (ref 26.0–34.0)
MCHC: 32.6 g/dL (ref 30.0–36.0)
MCV: 88.5 fL (ref 80.0–100.0)
Monocytes Absolute: 1.1 10*3/uL — ABNORMAL HIGH (ref 0.1–1.0)
Monocytes Relative: 9 %
Neutro Abs: 10.2 10*3/uL — ABNORMAL HIGH (ref 1.7–7.7)
Neutrophils Relative %: 79 %
Platelets: 347 10*3/uL (ref 150–400)
RBC: 5.31 MIL/uL (ref 4.22–5.81)
RDW: 12.2 % (ref 11.5–15.5)
WBC: 12.9 10*3/uL — ABNORMAL HIGH (ref 4.0–10.5)
nRBC: 0 % (ref 0.0–0.2)

## 2020-04-03 LAB — C-REACTIVE PROTEIN: CRP: 0.6 mg/dL (ref <1.0)

## 2020-04-03 LAB — GLUCOSE, CAPILLARY
Glucose-Capillary: 129 mg/dL — ABNORMAL HIGH (ref 70–99)
Glucose-Capillary: 151 mg/dL — ABNORMAL HIGH (ref 70–99)
Glucose-Capillary: 180 mg/dL — ABNORMAL HIGH (ref 70–99)
Glucose-Capillary: 131 mg/dL — ABNORMAL HIGH (ref 70–99)

## 2020-04-03 LAB — FIBRIN DERIVATIVES D-DIMER (ARMC ONLY): Fibrin derivatives D-dimer (ARMC): 273.3 ng/mL (FEU) (ref 0.00–499.00)

## 2020-04-03 MED ORDER — ENOXAPARIN SODIUM 80 MG/0.8ML ~~LOC~~ SOLN
0.5000 mg/kg | SUBCUTANEOUS | Status: DC
  Administered 2020-04-03 – 2020-04-05 (×3): 70 mg via SUBCUTANEOUS
  Filled 2020-04-03 (×4): qty 0.8

## 2020-04-03 NOTE — Progress Notes (Signed)
Patient ID: Martin Ho, male   DOB: 02-07-1971, 50 y.o.   MRN: [id number]  PROGRESS NOTE    Rashidi Loh  OYD:[id number] DOB: December 19, 1970 DOA: 03/30/2020 PCP: Pcp, No   Brief Narrative:  50 year old male with history of psoriasis on Stelara injections presented with worsening shortness of breath.  He tested positive for Covid on 03/23/2020.  On presentation, he was saturating to the mid 80s on room air and subsequently required supplemental oxygen.  He was admitted for COVID-19 pneumonia with hypoxia and started on remdesivir and Solu-Medrol.  Baricitinib was also started because of worsening respiratory status.  Assessment & Plan:   Acute hypoxic respiratory failure COVID-19 pneumonia Diarrhea secondary to COVID-19 infection -Hypoxic with O2 sats in the low 80s on room air at the time of admission, -CTA chest negative for PE but showed moderate to severe bilateral patchy groundglass opacities -Has been on 6 L of O2 for the last 2 days -Continue IV remdesivir, day 5 today, also continue Solu-Medrol, baricitinib. -Encouraged ambulation, proning and incentive spirometry use -Wean O2 as tolerated,  -Inflammatory markers improving   History of psoriasis -On Stelara injections as an outpatient.  Continue triamcinolone cream outpatient follow-up with PCP/rheumatology  Mildly elevated LFTs -Probably from Covid.  Improving.  Monitor   DVT prophylaxis: Lovenox Code Status: Full Family Communication: None Disposition Plan: Status is: Inpatient because: Inpatient level of care appropriate due to severity of illness  Dispo: The patient is from: Home              Anticipated d/c is to: Home              Anticipated d/c date is: Possibly 3 days              Patient currently is not medically stable to d/c.   Difficult to place patient No   Consultants: None  Procedures: None  Antimicrobials: None   Subjective: -Feels better today, feels like he is finally starting to improve,  still has cough, currently on 6 L of oxygen, appetite is better    Objective: Vitals:   04/03/20 0021 04/03/20 0335 04/03/20 0500 04/03/20 0737  BP: 127/72 112/72  122/75  Pulse: 67 77  76  Resp: 18 16  18   Temp: 98.1 F (36.7 C) 98.6 F (37 C)  97.6 F (36.4 C)  TempSrc:    Oral  SpO2: 97% 96%  94%  Weight:   (!) 137.5 kg   Height:        Intake/Output Summary (Last 24 hours) at 04/03/2020 1118 Last data filed at 04/03/2020 0450 Gross per 24 hour  Intake -  Output 200 ml  Net -200 ml   Filed Weights   03/30/20 1035 04/02/20 0450 04/03/20 0500  Weight: 131.5 kg (!) 139 kg (!) 137.5 kg    Examination:  General exam: Well built young male, sitting up in bed, AAOx3, no distress CVS: S1-S2, regular rate rhythm Lungs: Few bilateral fine rales especially at the bases Abdomen: Soft, nontender, bowel sounds present Extremities: Trace edema Skin: No rashes on exposed skin Psych: Appropriate mood and affect   Data Reviewed: I have personally reviewed following labs and imaging studies  CBC: Recent Labs  Lab 03/30/20 1038 03/31/20 0515 04/01/20 0456 04/02/20 0720 04/03/20 0458  WBC 8.0 5.0 10.2 11.7* 12.9*  NEUTROABS  --  3.5 7.5 8.5* 10.2*  HGB 16.7 15.6 15.3 16.3 15.3  HCT 50.2 47.3 47.6 48.9 47.0  MCV 87.0 88.1 89.3 89.1 88.5  PLT 187 217 273 352 588   Basic Metabolic Panel: Recent Labs  Lab 03/30/20 1038 03/31/20 0515 04/01/20 0456 04/02/20 0720 04/03/20 0458  NA 136 136 136 138 137  K 3.5 3.6 3.9 4.0 3.8  CL 97* 97* 98 97* 98  CO2 26 29 26 30 29   GLUCOSE 109* 137* 135* 117* 129*  BUN 11 16 19  23* 22*  CREATININE 0.98 1.00 0.91 0.91 0.92  CALCIUM 8.9 8.7* 8.9 8.9 8.6*  MG 2.2  --  2.2 2.6*  --    GFR: Estimated Creatinine Clearance: 156.1 mL/min (by C-G formula based on SCr of 0.92 mg/dL). Liver Function Tests: Recent Labs  Lab 03/30/20 1038 03/31/20 0515 04/01/20 0456 04/02/20 0720 04/03/20 0458  AST 102* 81* 63* 58* 59*  ALT 47* 47* 46*  52* 55*  ALKPHOS 42 42 39 41 38  BILITOT 0.9 1.0 0.8 0.8 0.9  PROT 7.4 7.2 6.8 6.9 6.8  ALBUMIN 3.7 3.2* 3.1* 3.3* 3.4*   Recent Labs  Lab 03/30/20 1038  LIPASE 26   No results for input(s): AMMONIA in the last 168 hours. Coagulation Profile: No results for input(s): INR, PROTIME in the last 168 hours. Cardiac Enzymes: No results for input(s): CKTOTAL, CKMB, CKMBINDEX, TROPONINI in the last 168 hours. BNP (last 3 results) No results for input(s): PROBNP in the last 8760 hours. HbA1C: No results for input(s): HGBA1C in the last 72 hours. CBG: Recent Labs  Lab 04/02/20 0804 04/02/20 1154 04/02/20 1542 04/02/20 2005 04/03/20 0737  GLUCAP 111* 168* 202* 160* 129*   Lipid Profile: No results for input(s): CHOL, HDL, LDLCALC, TRIG, CHOLHDL, LDLDIRECT in the last 72 hours. Thyroid Function Tests: No results for input(s): TSH, T4TOTAL, FREET4, T3FREE, THYROIDAB in the last 72 hours. Anemia Panel: No results for input(s): VITAMINB12, FOLATE, FERRITIN, TIBC, IRON, RETICCTPCT in the last 72 hours. Sepsis Labs: Recent Labs  Lab 03/30/20 1114 03/30/20 1518  PROCALCITON <0.10  --   LATICACIDVEN 1.5 1.2    Recent Results (from the past 240 hour(s))  Blood Culture (routine x 2)     Status: None (Preliminary result)   Collection Time: 03/30/20 12:13 PM   Specimen: BLOOD  Result Value Ref Range Status   Specimen Description BLOOD BLOOD LEFT FOREARM  Final   Special Requests   Final    BOTTLES DRAWN AEROBIC AND ANAEROBIC Blood Culture adequate volume   Culture   Final    NO GROWTH 4 DAYS Performed at [facility], [address], [place], [place] [address]    Report Status PENDING  Incomplete  Blood Culture (routine x 2)     Status: None (Preliminary result)   Collection Time: 03/30/20 12:18 PM   Specimen: BLOOD  Result Value Ref Range Status   Specimen Description BLOOD BLOOD RIGHT HAND  Final   Special Requests   Final    BOTTLES DRAWN AEROBIC AND ANAEROBIC  Blood Culture adequate volume   Culture   Final    NO GROWTH 4 DAYS Performed at [facility], [address] [place], [place] [address]    Report Status PENDING  Incomplete         Radiology Studies: No results found.      Scheduled Meds: . albuterol  2 puff Inhalation Q4H  . baricitinib  4 mg Oral Daily  . enoxaparin (LOVENOX) injection  0.5 mg/kg Subcutaneous Q24H  . insulin aspart  0-9 Units Subcutaneous TID WC  . methylPREDNISolone (SOLU-MEDROL) injection  80 mg  Intravenous Q12H  . triamcinolone   Topical BID   Continuous Infusions:  Domenic Polite, MD [facility] 04/03/2020, 11:18 AM

## 2020-04-03 NOTE — Clinical Social Work Note (Signed)
Patient does not have a PCP. CSW called patient. No preference on location. [facility] but they are not accepting new patients. Appointment made at [facility] on 2/17 at 1:00. Information added to [place], [place]

## 2020-04-04 DIAGNOSIS — J1282 Pneumonia due to coronavirus disease 2019: Secondary | ICD-10-CM | POA: Diagnosis not present

## 2020-04-04 DIAGNOSIS — U071 COVID-19: Secondary | ICD-10-CM | POA: Diagnosis not present

## 2020-04-04 DIAGNOSIS — J9601 Acute respiratory failure with hypoxia: Secondary | ICD-10-CM | POA: Diagnosis not present

## 2020-04-04 LAB — CULTURE, BLOOD (ROUTINE X 2)
Culture: NO GROWTH
Culture: NO GROWTH
Special Requests: ADEQUATE
Special Requests: ADEQUATE

## 2020-04-04 LAB — CBC WITH DIFFERENTIAL/PLATELET
Abs Immature Granulocytes: 0.18 10*3/uL — ABNORMAL HIGH (ref 0.00–0.07)
Basophils Absolute: 0 10*3/uL (ref 0.0–0.1)
Basophils Relative: 0 %
Eosinophils Absolute: 0 10*3/uL (ref 0.0–0.5)
Eosinophils Relative: 0 %
HCT: 48.3 % (ref 39.0–52.0)
Hemoglobin: 16.3 g/dL (ref 13.0–17.0)
Immature Granulocytes: 2 %
Lymphocytes Relative: 12 %
Lymphs Abs: 1.3 10*3/uL (ref 0.7–4.0)
MCH: 29.9 pg (ref 26.0–34.0)
MCHC: 33.7 g/dL (ref 30.0–36.0)
MCV: 88.5 fL (ref 80.0–100.0)
Monocytes Absolute: 1.3 10*3/uL — ABNORMAL HIGH (ref 0.1–1.0)
Monocytes Relative: 12 %
Neutro Abs: 7.9 10*3/uL — ABNORMAL HIGH (ref 1.7–7.7)
Neutrophils Relative %: 74 %
Platelets: 393 10*3/uL (ref 150–400)
RBC: 5.46 MIL/uL (ref 4.22–5.81)
RDW: 12 % (ref 11.5–15.5)
WBC: 10.6 10*3/uL — ABNORMAL HIGH (ref 4.0–10.5)
nRBC: 0 % (ref 0.0–0.2)

## 2020-04-04 LAB — COMPREHENSIVE METABOLIC PANEL
ALT: 56 U/L — ABNORMAL HIGH (ref 0–44)
AST: 47 U/L — ABNORMAL HIGH (ref 15–41)
Albumin: 3.2 g/dL — ABNORMAL LOW (ref 3.5–5.0)
Alkaline Phosphatase: 35 U/L — ABNORMAL LOW (ref 38–126)
Anion gap: 11 (ref 5–15)
BUN: 20 mg/dL (ref 6–20)
CO2: 26 mmol/L (ref 22–32)
Calcium: 8.7 mg/dL — ABNORMAL LOW (ref 8.9–10.3)
Chloride: 99 mmol/L (ref 98–111)
Creatinine, Ser: 0.82 mg/dL (ref 0.61–1.24)
GFR, Estimated: >60 mL/min (ref >60)
Glucose, Bld: 117 mg/dL — ABNORMAL HIGH (ref 70–99)
Potassium: 4.2 mmol/L (ref 3.5–5.1)
Sodium: 136 mmol/L (ref 135–145)
Total Bilirubin: 0.9 mg/dL (ref 0.3–1.2)
Total Protein: 6.6 g/dL (ref 6.5–8.1)

## 2020-04-04 LAB — GLUCOSE, CAPILLARY
Glucose-Capillary: 107 mg/dL — ABNORMAL HIGH (ref 70–99)
Glucose-Capillary: 106 mg/dL — ABNORMAL HIGH (ref 70–99)
Glucose-Capillary: 143 mg/dL — ABNORMAL HIGH (ref 70–99)
Glucose-Capillary: 154 mg/dL — ABNORMAL HIGH (ref 70–99)

## 2020-04-04 LAB — FIBRIN DERIVATIVES D-DIMER (ARMC ONLY): Fibrin derivatives D-dimer (ARMC): 294.51 ng/mL (FEU) (ref 0.00–499.00)

## 2020-04-04 LAB — FERRITIN: Ferritin: 524 ng/mL — ABNORMAL HIGH (ref 24–336)

## 2020-04-04 LAB — C-REACTIVE PROTEIN: CRP: 0.5 mg/dL (ref <1.0)

## 2020-04-04 MED ORDER — METHYLPREDNISOLONE SODIUM SUCC 40 MG IJ SOLR
40.0000 mg | Freq: Two times a day (BID) | INTRAMUSCULAR | Status: DC
  Administered 2020-04-04 – 2020-04-05 (×2): 40 mg via INTRAVENOUS
  Filled 2020-04-04 (×2): qty 1

## 2020-04-04 NOTE — Progress Notes (Signed)
Patient ID: Martin Ho, male   DOB: [birth year], 50 y.o.   MRN: [id number]  PROGRESS NOTE    Amiel Mccaffrey  NWG:[id number] DOB: [birth year] DOA: 03/30/2020 PCP: Pcp, No   Brief Narrative:  50 year old male with history of psoriasis on Stelara injections presented with worsening shortness of breath.  He tested positive for Covid on 03/23/2020.  On presentation, he was saturating to the mid 80s on room air and subsequently required supplemental oxygen.  He was admitted for COVID-19 pneumonia with hypoxia and started on remdesivir and Solu-Medrol.  Baricitinib was also started because of worsening respiratory status.  Assessment & Plan:   Acute hypoxic respiratory failure COVID-19 pneumonia -Hypoxic with O2 sats in the low 80s on room air at the time of admission, -CTA chest negative for PE but showed moderate to severe bilateral patchy groundglass opacities -Completed remdesivir course, continue Solu-Medrol will start taper, continue baricitinib -Encouraged ambulation, proning and incentive spirometry use -Continue to aggressively wean oxygen as tolerated -Inflammatory markers are improving  History of psoriasis -On Stelara injections as an outpatient.  Continue triamcinolone cream outpatient follow-up with PCP/rheumatology  Mildly elevated LFTs -Probably from Covid.  Improving.  Monitor   DVT prophylaxis: Lovenox Code Status: Full Family Communication: None Disposition Plan: Status is: Inpatient because: Inpatient level of care appropriate due to severity of illness  Dispo: The patient is from: Home              Anticipated d/c is to: Home              Anticipated d/c date is: Likely 48 hours hopefully Saturday              Patient currently is not medically stable to d/c.   Difficult to place patient No   Consultants: None  Procedures: None  Antimicrobials: None   Subjective: -Continues to feel better, overall improving down to 4 1/2 L of oxygen this morning     Objective: Vitals:   04/04/20 0355 04/04/20 0409 04/04/20 0437 04/04/20 0806  BP: 90/62 129/75  116/76  Pulse: 69 71  76  Resp: 18   17  Temp: 97.8 F (36.6 C)   (!) 97.4 F (36.3 C)  TempSrc:    Oral  SpO2: 94%   98%  Weight:   (!) 136.9 kg   Height:       No intake or output data in the 24 hours ending 04/04/20 1225 Filed Weights   04/02/20 0450 04/03/20 0500 04/04/20 0437  Weight: (!) 139 kg (!) 137.5 kg (!) 136.9 kg    Examination:  General exam: Pleasant averagely built male sitting up in bed, AAOx3, no distress CVS: S1-S2, regular rate rhythm Lungs: Few fine basilar rales Abdomen: Soft, nontender, bowel sounds present Extremities: Trace edema Skin: Multiple psoriatic rashes on lower legs Psych: Appropriate mood and affect   Data Reviewed: I have personally reviewed following labs and imaging studies  CBC: Recent Labs  Lab 03/31/20 0515 04/01/20 0456 04/02/20 0720 04/03/20 0458 04/04/20 0456  WBC 5.0 10.2 11.7* 12.9* 10.6*  NEUTROABS 3.5 7.5 8.5* 10.2* 7.9*  HGB 15.6 15.3 16.3 15.3 16.3  HCT 47.3 47.6 48.9 47.0 48.3  MCV 88.1 89.3 89.1 88.5 88.5  PLT 217 273 352 347 578   Basic Metabolic Panel: Recent Labs  Lab 03/30/20 1038 03/31/20 0515 04/01/20 0456 04/02/20 0720 04/03/20 0458 04/04/20 0456  NA 136 136 136 138 137 136  K 3.5 3.6 3.9 4.0 3.8 4.2  CL 97*  97* 98 97* 98 99  CO2 26 29 26 30 29 26   GLUCOSE 109* 137* 135* 117* 129* 117*  BUN 11 16 19  23* 22* 20  CREATININE 0.98 1.00 0.91 0.91 0.92 0.82  CALCIUM 8.9 8.7* 8.9 8.9 8.6* 8.7*  MG 2.2  --  2.2 2.6*  --   --    GFR: Estimated Creatinine Clearance: 174.8 mL/min (by C-G formula based on SCr of 0.82 mg/dL). Liver Function Tests: Recent Labs  Lab 03/31/20 0515 04/01/20 0456 04/02/20 0720 04/03/20 0458 04/04/20 0456  AST 81* 63* 58* 59* 47*  ALT 47* 46* 52* 55* 56*  ALKPHOS 42 39 41 38 35*  BILITOT 1.0 0.8 0.8 0.9 0.9  PROT 7.2 6.8 6.9 6.8 6.6  ALBUMIN 3.2* 3.1* 3.3* 3.4* 3.2*    Recent Labs  Lab 03/30/20 1038  LIPASE 26   No results for input(s): AMMONIA in the last 168 hours. Coagulation Profile: No results for input(s): INR, PROTIME in the last 168 hours. Cardiac Enzymes: No results for input(s): CKTOTAL, CKMB, CKMBINDEX, TROPONINI in the last 168 hours. BNP (last 3 results) No results for input(s): PROBNP in the last 8760 hours. HbA1C: No results for input(s): HGBA1C in the last 72 hours. CBG: Recent Labs  Lab 04/03/20 0737 04/03/20 1140 04/03/20 1616 04/03/20 2117 04/04/20 0806  GLUCAP 129* 151* 180* 131* 107*   Lipid Profile: No results for input(s): CHOL, HDL, LDLCALC, TRIG, CHOLHDL, LDLDIRECT in the last 72 hours. Thyroid Function Tests: No results for input(s): TSH, T4TOTAL, FREET4, T3FREE, THYROIDAB in the last 72 hours. Anemia Panel: Recent Labs    04/04/20 0456  FERRITIN 524*   Sepsis Labs: Recent Labs  Lab 03/30/20 1114 03/30/20 1518  PROCALCITON <0.10  --   LATICACIDVEN 1.5 1.2    Recent Results (from the past 240 hour(s))  Blood Culture (routine x 2)     Status: None   Collection Time: 03/30/20 12:13 PM   Specimen: BLOOD  Result Value Ref Range Status   Specimen Description BLOOD BLOOD LEFT FOREARM  Final   Special Requests   Final    BOTTLES DRAWN AEROBIC AND ANAEROBIC Blood Culture adequate volume   Culture   Final    NO GROWTH 5 DAYS Performed at [facility], [address] [place], [place] [address]    Report Status 04/04/2020 FINAL  Final  Blood Culture (routine x 2)     Status: None   Collection Time: 03/30/20 12:18 PM   Specimen: BLOOD  Result Value Ref Range Status   Specimen Description BLOOD BLOOD RIGHT HAND  Final   Special Requests   Final    BOTTLES DRAWN AEROBIC AND ANAEROBIC Blood Culture adequate volume   Culture   Final    NO GROWTH 5 DAYS Performed at [facility], [address] [place], [place] [address]    Report Status 04/04/2020 FINAL  Final    Scheduled  Meds: . albuterol  2 puff Inhalation Q4H  . baricitinib  4 mg Oral Daily  . enoxaparin (LOVENOX) injection  0.5 mg/kg Subcutaneous Q24H  . insulin aspart  0-9 Units Subcutaneous TID WC  . methylPREDNISolone (SOLU-MEDROL) injection  80 mg Intravenous Q12H  . triamcinolone   Topical BID   Continuous Infusions:  Domenic Polite, MD [facility] 04/04/2020, 12:25 PM

## 2020-04-05 DIAGNOSIS — J1282 Pneumonia due to coronavirus disease 2019: Secondary | ICD-10-CM | POA: Diagnosis not present

## 2020-04-05 DIAGNOSIS — U071 COVID-19: Secondary | ICD-10-CM | POA: Diagnosis not present

## 2020-04-05 DIAGNOSIS — J9601 Acute respiratory failure with hypoxia: Secondary | ICD-10-CM | POA: Diagnosis not present

## 2020-04-05 LAB — GLUCOSE, CAPILLARY
Glucose-Capillary: 97 mg/dL (ref 70–99)
Glucose-Capillary: 94 mg/dL (ref 70–99)
Glucose-Capillary: 149 mg/dL — ABNORMAL HIGH (ref 70–99)

## 2020-04-05 MED ORDER — PREDNISONE 20 MG PO TABS
40.0000 mg | ORAL_TABLET | Freq: Every day | ORAL | Status: DC
  Administered 2020-04-06: 10:00:00 40 mg via ORAL
  Filled 2020-04-05: qty 2

## 2020-04-05 NOTE — Progress Notes (Signed)
Patient ID: Martin Ho, male   DOB: 06-25-1970, 50 y.o.   MRN: [id number]  PROGRESS NOTE    Martin Ho  HLK:[id number] DOB: 1970-05-03 DOA: 03/30/2020 PCP: Pcp, No   Brief Narrative:  50 year old male with history of psoriasis on Stelara injections presented with worsening shortness of breath.  He tested positive for Covid on 03/23/2020.  On presentation, he was saturating to the mid 80s on room air and subsequently required supplemental oxygen.  He was admitted for COVID-19 pneumonia with hypoxia and started on remdesivir and Solu-Medrol.  Baricitinib was also started because of worsening respiratory status.  Assessment & Plan:   Acute hypoxic respiratory failure COVID-19 pneumonia -Hypoxic with O2 sats in the low 80s on room air at the time of admission, -CTA chest negative for PE but showed moderate to severe bilateral patchy groundglass opacities -Completed remdesivir course, changed to prednisone taper, continue baricitinib 1 more day -Encouraged ambulation, proning and incentive spirometry use -Wean down oxygen as tolerated -Discharge planning, hopefully home tomorrow  History of psoriasis -On Stelara injections as an outpatient.  Continue triamcinolone cream outpatient follow-up with PCP/rheumatology  Mildly elevated LFTs -Probably from Covid.  Improving.  Monitor   DVT prophylaxis: Lovenox Code Status: Full Family Communication: None Disposition Plan: Status is: Inpatient because: Inpatient level of care appropriate due to severity of illness  Dispo: The patient is from: Home              Anticipated d/c is to: Home              Anticipated d/c date is: Hopefully tomorrow              Patient currently is not medically stable to d/c.   Difficult to place patient No   Consultants: None  Procedures: None  Antimicrobials: None   Subjective: -Continues to feel better, down to 3 L of oxygen this morning   Objective: Vitals:   04/05/20 0017 04/05/20 0300  04/05/20 0500 04/05/20 0759  BP: (!) 108/51 (!) 119/56  114/68  Pulse: 64 73  65  Resp: 18 18  16   Temp: 97.8 F (36.6 C) 97.9 F (36.6 C)  98 F (36.7 C)  TempSrc:  Oral    SpO2: 95% 95%  96%  Weight:   (!) 136.5 kg   Height:       No intake or output data in the 24 hours ending 04/05/20 1236 Filed Weights   04/03/20 0500 04/04/20 0437 04/05/20 0500  Weight: (!) 137.5 kg (!) 136.9 kg (!) 136.5 kg    Examination:  General exam: Pleasant averagely built male sitting up in bed, AAOx3, no distress CVS: S1-S2, regular rate rhythm Lungs: Few basilar rales, otherwise clear Abdomen: Soft, nontender, bowel sounds present Extremities: Trace edema Skin: Multiple psoriatic rashes on both lower legs Psych: Appropriate mood and affect   Data Reviewed: I have personally reviewed following labs and imaging studies  CBC: Recent Labs  Lab 03/31/20 0515 04/01/20 0456 04/02/20 0720 04/03/20 0458 04/04/20 0456  WBC 5.0 10.2 11.7* 12.9* 10.6*  NEUTROABS 3.5 7.5 8.5* 10.2* 7.9*  HGB 15.6 15.3 16.3 15.3 16.3  HCT 47.3 47.6 48.9 47.0 48.3  MCV 88.1 89.3 89.1 88.5 88.5  PLT 217 273 352 347 734   Basic Metabolic Panel: Recent Labs  Lab 03/30/20 1038 03/31/20 0515 04/01/20 0456 04/02/20 0720 04/03/20 0458 04/04/20 0456  NA 136 136 136 138 137 136  K 3.5 3.6 3.9 4.0 3.8 4.2  CL 97*  97* 98 97* 98 99  CO2 26 29 26 30 29 26   GLUCOSE 109* 137* 135* 117* 129* 117*  BUN 11 16 19  23* 22* 20  CREATININE 0.98 1.00 0.91 0.91 0.92 0.82  CALCIUM 8.9 8.7* 8.9 8.9 8.6* 8.7*  MG 2.2  --  2.2 2.6*  --   --    GFR: Estimated Creatinine Clearance: 174.5 mL/min (by C-G formula based on SCr of 0.82 mg/dL). Liver Function Tests: Recent Labs  Lab 03/31/20 0515 04/01/20 0456 04/02/20 0720 04/03/20 0458 04/04/20 0456  AST 81* 63* 58* 59* 47*  ALT 47* 46* 52* 55* 56*  ALKPHOS 42 39 41 38 35*  BILITOT 1.0 0.8 0.8 0.9 0.9  PROT 7.2 6.8 6.9 6.8 6.6  ALBUMIN 3.2* 3.1* 3.3* 3.4* 3.2*   Recent  Labs  Lab 03/30/20 1038  LIPASE 26   No results for input(s): AMMONIA in the last 168 hours. Coagulation Profile: No results for input(s): INR, PROTIME in the last 168 hours. Cardiac Enzymes: No results for input(s): CKTOTAL, CKMB, CKMBINDEX, TROPONINI in the last 168 hours. BNP (last 3 results) No results for input(s): PROBNP in the last 8760 hours. HbA1C: No results for input(s): HGBA1C in the last 72 hours. CBG: Recent Labs  Lab 04/04/20 1226 04/04/20 1633 04/04/20 2124 04/05/20 0758 04/05/20 1225  GLUCAP 106* 143* 154* 97 94   Lipid Profile: No results for input(s): CHOL, HDL, LDLCALC, TRIG, CHOLHDL, LDLDIRECT in the last 72 hours. Thyroid Function Tests: No results for input(s): TSH, T4TOTAL, FREET4, T3FREE, THYROIDAB in the last 72 hours. Anemia Panel: Recent Labs    04/04/20 0456  FERRITIN 524*   Sepsis Labs: Recent Labs  Lab 03/30/20 1114 03/30/20 1518  PROCALCITON <0.10  --   LATICACIDVEN 1.5 1.2    Recent Results (from the past 240 hour(s))  Blood Culture (routine x 2)     Status: None   Collection Time: 03/30/20 12:13 PM   Specimen: BLOOD  Result Value Ref Range Status   Specimen Description BLOOD BLOOD LEFT FOREARM  Final   Special Requests   Final    BOTTLES DRAWN AEROBIC AND ANAEROBIC Blood Culture adequate volume   Culture   Final    NO GROWTH 5 DAYS Performed at [facility], [address], [place], [place] [address]    Report Status 04/04/2020 FINAL  Final  Blood Culture (routine x 2)     Status: None   Collection Time: 03/30/20 12:18 PM   Specimen: BLOOD  Result Value Ref Range Status   Specimen Description BLOOD BLOOD RIGHT HAND  Final   Special Requests   Final    BOTTLES DRAWN AEROBIC AND ANAEROBIC Blood Culture adequate volume   Culture   Final    NO GROWTH 5 DAYS Performed at [facility], [address] [place], [place] [address]    Report Status 04/04/2020 FINAL  Final    Scheduled Meds: . albuterol   2 puff Inhalation Q4H  . baricitinib  4 mg Oral Daily  . enoxaparin (LOVENOX) injection  0.5 mg/kg Subcutaneous Q24H  . insulin aspart  0-9 Units Subcutaneous TID WC  . methylPREDNISolone (SOLU-MEDROL) injection  40 mg Intravenous Q12H  . triamcinolone   Topical BID   Continuous Infusions:  Domenic Polite, MD [facility] 04/05/2020, 12:36 PM

## 2020-04-06 DIAGNOSIS — J9601 Acute respiratory failure with hypoxia: Secondary | ICD-10-CM

## 2020-04-06 DIAGNOSIS — U071 COVID-19: Secondary | ICD-10-CM | POA: Diagnosis not present

## 2020-04-06 MED ORDER — DEXAMETHASONE 6 MG PO TABS: 6.0000 mg | ORAL_TABLET | Freq: Every day | ORAL | 0 refills | Status: AC

## 2020-04-06 MED ORDER — ACETAMINOPHEN 500 MG PO TABS: 500.0000 mg | ORAL_TABLET | Freq: Four times a day (QID) | ORAL | 2 refills | Status: DC | PRN

## 2020-04-06 NOTE — TOC Transition Note (Addendum)
Transition of Care [facility]) - CM/SW Discharge Note   Patient Details  Name: Jace Fermin MRN: [id number] Date of Birth: [birth year]  [facility]) CM/SW Contact:  Izola Price, RN Phone Number: 04/06/2020, 9:15 AM   Clinical Narrative:   04/06/20 0915 am: Patient has discharge orders noted to Home/Self care. Nursing Flowsheets indicate patient is on room air with saturation of 94% at 0730 this am per RN.  No DME orders, No HH orders. Follow up appointments/PCP arranged by LCSW in prior progression note copied here for information purposes: "Appointment made at [place] on 2/17 at 1:00. Information added to AVS".  Patient is listed as single with a friend as contact information. Elias Else [phone]. TOC will sign off at discharge unless other needs arise. Simmie Davies RN CM    Final next level of care: Home/Self Care Barriers to Discharge: Barriers Resolved   Patient Goals and CMS Choice        Discharge Placement                       Discharge Plan and Services                DME Agency: NA  DME Arranged: NA       HH Arranged: NA HH Agency: NA        Social Determinants of Health (SDOH) Interventions     Readmission Risk Interventions No flowsheet data found.

## 2020-04-06 NOTE — Progress Notes (Addendum)
Pt is being discharged home.  Discharge papers given and explained to pt.  Pt verbalized understanding.  Meds and f/u appointment reviewed.  Rx given. Awaiting transportation.

## 2020-04-08 NOTE — Discharge Summary (Signed)
Physician Discharge Summary  Author Hat[place]ad ZHY:[id number] DOB: 04-16-1970 DOA: 03/30/2020  PCP: Pcp, No  Admit date: 03/30/2020 Discharge date: 04/06/2020  Time spent: 35 minutes  Recommendations for Outpatient Follow-up:  1. PCP in 1 week   Discharge Diagnoses:  Active Problems:   Acute hypoxemic respiratory failure due to COVID-19 [facility])   Gastroenteritis due to COVID-19 virus   Psoriasis   Pneumonia due to COVID-19 virus   Acute respiratory failure with hypoxia [facility])   Discharge Condition: stable  Diet recommendation:regular  Filed Weights   04/03/20 0500 04/04/20 0437 04/05/20 0500  Weight: (!) 137.5 kg (!) 136.9 kg (!) 136.5 kg    History of present illness:  50 year old male with history of psoriasis on Stelara injections presented with worsening shortness of breath.  He tested positive for Covid on 03/23/2020.  On presentation, he was saturating to the mid 80s on room air and subsequently required supplemental oxygen.  He was admitted for COVID-19 pneumonia with hypoxia and started on remdesivir and Solu-Medrol.  Baricitinib was also started because of worsening respiratory status  Hospital Course:   Acute hypoxic respiratory failure COVID-19 pneumonia -Hypoxic with O2 sats in the low 80s on room air at the time of admission, -CTA chest negative for PE but showed moderate to severe bilateral patchy groundglass opacities -Completed remdesivir course, also treated with IV steroids and baricitinib -improved and weaned off Oxygen  History of psoriasis -On Stelara injections as an outpatient.  Continue triamcinolone cream outpatient follow-up with PCP/rheumatology  Mildly elevated LFTs -Probably from Covid.  Improving  Discharge Exam: Vitals:   04/06/20 0353 04/06/20 0730  BP: 122/72 127/77  Pulse: 66 71  Resp: 17 16  Temp: 98 F (36.7 C) 98.8 F (37.1 C)  SpO2: 96% 94%    General: AAOx3 Cardiovascular: S1S2/RRR Respiratory: CTAB  Discharge  Instructions   Discharge Instructions    Diet - low sodium heart healthy   Complete by: As directed    Increase activity slowly   Complete by: As directed      Allergies as of 04/06/2020   No Known Allergies     Medication List    TAKE these medications   acetaminophen 500 MG tablet Commonly known as: TYLENOL Take 1 tablet (500 mg total) by mouth every 6 (six) hours as needed for mild pain or headache.   dexamethasone 6 MG tablet Commonly known as: Decadron Take 1 tablet (6 mg total) by mouth daily for 3 days.      No Known Allergies  Follow-up Information    [facility] [place] on 04/18/2020.   Why: Appt at 1:00. You will go upstairs when you enter. Please bring your insurance card and photo ID. Contact information: [place] [address] [phone]                The results of significant diagnostics from this hospitalization (including imaging, microbiology, ancillary and laboratory) are listed below for reference.    Significant Diagnostic Studies: DG Chest 2 View  Result Date: 03/30/2020 CLINICAL DATA:  Shortness of breath EXAM: CHEST - 2 VIEW COMPARISON:  None. FINDINGS: Heart size and mediastinal contours are within normal limits. Diffuse bilateral airspace opacities, most confluent at the RIGHT lung base. No pleural effusion or pneumothorax is seen. Osseous structures about the chest are unremarkable. IMPRESSION: Diffuse bilateral airspace opacities, most likely multifocal pneumonia. Electronically Signed   By: Franki Cabot M.D.   On: 03/30/2020 11:05   CT Angio Chest PE  W and/or Wo Contrast  Result Date: 03/30/2020 CLINICAL DATA:  COVID positive, shortness of breath. EXAM: CT ANGIOGRAPHY CHEST WITH CONTRAST TECHNIQUE: Multidetector CT imaging of the chest was performed using the standard protocol during bolus administration of intravenous contrast. Multiplanar CT image reconstructions and MIPs were obtained to evaluate the  vascular anatomy. CONTRAST:  130m OMNIPAQUE IOHEXOL 350 MG/ML SOLN COMPARISON:  Same day chest radiograph. FINDINGS: Cardiovascular: Satisfactory opacification of the pulmonary arteries to the segmental level. No evidence of pulmonary embolism. Vascular calcifications are seen in the aortic arch. Normal heart size. No pericardial effusion. Mediastinum/Nodes: Prominent/borderline enlarged bilateral hilar and mediastinal lymph nodes are likely reactive. Thyroid gland, trachea, and esophagus demonstrate no significant findings. Lungs/Pleura: Moderate to severe patchy bilateral ground-glass opacities with a peripheral predominance are consistent with COVID-19 pneumonia. No pleural effusion or pneumothorax. Upper Abdomen: No acute abnormality. Musculoskeletal: No chest wall abnormality. No acute or significant osseous findings. Review of the MIP images confirms the above findings. IMPRESSION: 1. No evidence of pulmonary embolism. 2. Moderate to severe patchy bilateral ground-glass opacities with a peripheral predominance are consistent with COVID-19 pneumonia. Prominent/borderline enlarged bilateral hilar and mediastinal lymph nodes are likely reactive. Aortic Atherosclerosis (ICD10-I70.0). Electronically Signed   By: TZerita BoersM.D.   On: 03/30/2020 12:42    Microbiology: Recent Results (from the past 240 hour(s))  Blood Culture (routine x 2)     Status: None   Collection Time: 03/30/20 12:13 PM   Specimen: BLOOD  Result Value Ref Range Status   Specimen Description BLOOD BLOOD LEFT FOREARM  Final   Special Requests   Final    BOTTLES DRAWN AEROBIC AND ANAEROBIC Blood Culture adequate volume   Culture   Final    NO GROWTH 5 DAYS [facility] [address] [place] [address]   Report Status 04/04/2020 FINAL  Final  Blood Culture (routine x 2)     Status: None   Collection Time: 03/30/20 12:18 PM   Specimen: BLOOD  Result Value Ref Range Status   Specimen  Description BLOOD BLOOD RIGHT HAND  Final   Special Requests   Final    BOTTLES DRAWN AEROBIC AND ANAEROBIC Blood Culture adequate volume   Culture   Final    NO GROWTH 5 DAYS [facility] [address], [place] [address]   Report Status 04/04/2020 FINAL  Final     Labs: Basic Metabolic Panel: Recent Labs  Lab 04/02/20 0720 04/03/20 0458 04/04/20 0456  NA 138 137 136  K 4.0 3.8 4.2  CL 97* 98 99  CO2 30 29 26   GLUCOSE 117* 129* 117*  BUN 23* 22* 20  CREATININE 0.91 0.92 0.82  CALCIUM 8.9 8.6* 8.7*  MG 2.6*  --   --    Liver Function Tests: Recent Labs  Lab 04/02/20 0720 04/03/20 0458 04/04/20 0456  AST 58* 59* 47*  ALT 52* 55* 56*  ALKPHOS 41 38 35*  BILITOT 0.8 0.9 0.9  PROT 6.9 6.8 6.6  ALBUMIN 3.3* 3.4* 3.2*   No results for input(s): LIPASE, AMYLASE in the last 168 hours. No results for input(s): AMMONIA in the last 168 hours. CBC: Recent Labs  Lab 04/02/20 0720 04/03/20 0458 04/04/20 0456  WBC 11.7* 12.9* 10.6*  NEUTROABS 8.5* 10.2* 7.9*  HGB 16.3 15.3 16.3  HCT 48.9 47.0 48.3  MCV 89.1 88.5 88.5  PLT 352 347 393   Cardiac Enzymes: No results for input(s):  CKTOTAL, CKMB, CKMBINDEX, TROPONINI in the last 168 hours. BNP: BNP (last 3 results) No results for input(s): BNP in the last 8760 hours.  ProBNP (last 3 results) No results for input(s): PROBNP in the last 8760 hours.  CBG: Recent Labs  Lab 04/04/20 1633 04/04/20 2124 04/05/20 0758 04/05/20 1225 04/05/20 1611  GLUCAP 143* 154* 97 94 149*    Signed:  Domenic Polite MD.  [facility] 04/08/2020, 3:27 PM

## 2020-06-07 ENCOUNTER — Encounter: Payer: Self-pay | Admitting: Pulmonary Disease

## 2020-06-07 ENCOUNTER — Ambulatory Visit (INDEPENDENT_AMBULATORY_CARE_PROVIDER_SITE_OTHER): Payer: Commercial Managed Care - PPO | Admitting: Pulmonary Disease

## 2020-06-07 ENCOUNTER — Other Ambulatory Visit: Payer: Self-pay

## 2020-06-07 VITALS — BP 124/72 | HR 88 | Temp 97.5°F | Ht 78.0 in | Wt 298.6 lb

## 2020-06-07 DIAGNOSIS — U099 Post covid-19 condition, unspecified: Secondary | ICD-10-CM

## 2020-06-07 DIAGNOSIS — J8410 Pulmonary fibrosis, unspecified: Secondary | ICD-10-CM | POA: Diagnosis not present

## 2020-06-07 NOTE — Progress Notes (Signed)
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  Treated with remdesivir, Solu-Medrol, baricitinib.  He was able to be weaned off oxygen and discharged Post discharge he is followed up with Dr. Neoma Ho, cardiology for palpitations.  He had follow-up CT scan in March which showed improving infiltrates, residual post-COVID fibrosis and has been referred here for evaluation..  Overall he continues to improve though not back to baseline.  Has mild dyspnea on exertion, fatigue.  Denies any cough, fevers, chills History notable for psoriasis for which he was on Stelara injections.  He stopped the injections after Covid and is just getting topical treatment with tacrolimus and steroid creams  Pets: No pets Occupation: Works in [employer] Exposures: No mold, hot tub, Customer service manager.  No feather pillows or comforters Smoking history: Never smoker Travel history: No significant travel history Relevant family history: No family history of lung disease  Outpatient Encounter Medications as of 06/07/2020  Medication Sig  . albuterol (VENTOLIN HFA) 108 (90 Base) MCG/ACT inhaler PLEASE SEE ATTACHED FOR DETAILED DIRECTIONS  . amoxicillin-clavulanate (AUGMENTIN) 875-125 MG tablet TAKE 1 TABLET BY MOUTH TWICE DAILY X 10 DAYS WITH FOOD FOR RESOLVING PNEUMONIA  . fluticasone (FLONASE) 50 MCG/ACT nasal spray Place 1 spray into both nostrils daily.  Marland Kitchen ibuprofen (ADVIL) 800 MG tablet Take 800 mg by mouth 3 (three) times daily.  . promethazine (PHENERGAN) 25 MG tablet TAKE 1 OR 2 TABLETS EVERY 6 HOURS AS NEEDED FOR FOR NAUSEA OR VOMITING  . rosuvastatin (CRESTOR) 10 MG tablet TAKE 1 TABLET BY MOUTH  NIGHTLY AT BEDTIME FOR HIGH CHOLESTEROL  . tacrolimus (PROTOPIC) 0.1 % ointment APPLY 1 APPLICATION TOPICALLY TO AFFECTED AREAS TWICE A DAY  . triamcinolone ointment (KENALOG) 0.1 % APPPLY TWICE DAILY FOR UP TO 2 WEEKS AT A TIME AS NEEDED FOR FLARES. AVOID FACE/GROIN/UNDERARMS  . [DISCONTINUED] acetaminophen (TYLENOL) 500 MG tablet Take 1 tablet (500 mg total) by mouth every 6 (six) hours as needed for mild pain or headache.   No facility-administered encounter medications on file as of 06/07/2020.    Allergies as of 06/07/2020  . (No Known Allergies)    No past medical history on file.  No past surgical history on file.  No family history on file.  Social History   Socioeconomic History  . Marital status: Single    Spouse name: Not on file  . Number of children: Not on file  . Years of education: Not on file  . Highest education level: Not on file  Occupational History  . Not on file  Tobacco Use  . Smoking status: Never Smoker  . Smokeless tobacco: Never Used  Substance and Sexual Activity  . Alcohol use: Not Currently  . Drug use: Not Currently  . Sexual activity: Not on file  Other Topics Concern  . Not on file  Social History Narrative  . Not on file   Social Determinants of Health   Financial Resource Strain: Not on file  Food Insecurity: Not on file  Transportation Needs: Not on file  Physical Activity: Not on file  Stress: Not on file  Social Connections: Not on file  Intimate Partner Violence: Not on file  Review of systems: Review of Systems  Constitutional: Negative for fever and chills.  HENT: Negative.   Eyes: Negative for blurred vision.  Respiratory: as per HPI  Cardiovascular: Negative for chest pain and palpitations.  Gastrointestinal: Negative for vomiting, diarrhea, blood per rectum. Genitourinary: Negative for dysuria, urgency, frequency and hematuria.  Musculoskeletal: Negative for myalgias, back pain and joint pain.  Skin: Negative  for itching and rash.  Neurological: Negative for dizziness, tremors, focal weakness, seizures and loss of consciousness.  Endo/Heme/Allergies: Negative for environmental allergies.  Psychiatric/Behavioral: Negative for depression, suicidal ideas and hallucinations.  All other systems reviewed and are negative.  Physical Exam: Blood pressure 124/72, pulse 88, temperature (!) 97.5 F (36.4 C), temperature source Temporal, height 6' 6"  (1.981 m), weight 298 lb 9.6 oz (135.4 kg), SpO2 99 %. Gen:      No acute distress HEENT:  EOMI, sclera anicteric Neck:     No masses; no thyromegaly Lungs:    Clear to auscultation bilaterally; normal respiratory effort CV:         Regular rate and rhythm; no murmurs Abd:      + bowel sounds; soft, non-tender; no palpable masses, no distension Ext:    No edema; adequate peripheral perfusion Skin:      Warm and dry; no rash Neuro: alert and oriented x 3 Psych: normal mood and affect  Data Reviewed: Imaging: CTA 03/30/2020-no PE, moderate to severe patchy bilateral groundglass opacities.  I have reviewed the images personally  CT chest report 05/10/2020-increased interstitial markings likely fibrosis.  PFTs:  Labs:  Cardiac: Echocardiogram 04/26/2020 Normal LV, RV systolic function, normal pulmonary artery pressure.  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  Plan/Recommendations: High-res CT, PFTs  Martin Garfinkel MD [facility] 06/07/2020, 9:38 AM  CC: Adaline Sill, NP

## 2020-06-07 NOTE — Patient Instructions (Signed)
We will try to get images of your prior CT scan on a disc.  Please contact your primary care to see if we can facilitate that Agree with starting an exercise regimen. We will order a follow-up high-resolution CT and PFTs in 2 months Follow-up in clinic after 2 months

## 2020-06-25 ENCOUNTER — Telehealth: Payer: Self-pay

## 2020-06-25 NOTE — Telephone Encounter (Signed)
Assisted pt with MyChart sign up and info for meds in hospital. Pt was on his way to his appointment and needed the information for his doctor. Pt identified himself with name, DOB, Last 4 of SSN and address.

## 2020-07-08 ENCOUNTER — Telehealth: Payer: Self-pay | Admitting: Pulmonary Disease

## 2020-07-08 ENCOUNTER — Ambulatory Visit: Admission: RE | Admit: 2020-07-08 | Payer: Commercial Managed Care - PPO | Source: Ambulatory Visit

## 2020-07-08 ENCOUNTER — Ambulatory Visit: Payer: Commercial Managed Care - PPO

## 2020-07-08 NOTE — Telephone Encounter (Signed)
Called and spoke with pt and he stated that he did have COVID back on Mar 21, 2020.  He has since recovered but was told that he may have some scarring to his lungs from this.  He is going to be going back to work and will be required to have the covid vaccines done.  He was told by his PCP to call and ask if PM thought that it was safe for him to get the vaccine now.  PM please advise. Thanks

## 2020-07-08 NOTE — Telephone Encounter (Signed)
ATC LVMTCB x 1  

## 2020-07-08 NOTE — Telephone Encounter (Signed)
Yes it is okay for him to get his COVID vaccines

## 2020-07-09 NOTE — Telephone Encounter (Signed)
I have called and spoke with pt and he is aware of PM recs that it is ok for him to get the covid vaccine.  Pt is aware that we will forward this over to his PCP.

## 2020-07-12 ENCOUNTER — Ambulatory Visit
Admission: RE | Admit: 2020-07-12 | Discharge: 2020-07-12 | Disposition: A | Discharge status: Home / Self Care | Payer: Commercial Managed Care - PPO | Source: Ambulatory Visit | Attending: Pulmonary Disease | Admitting: Pulmonary Disease

## 2020-07-12 ENCOUNTER — Other Ambulatory Visit: Payer: Self-pay

## 2020-07-12 DIAGNOSIS — J8410 Pulmonary fibrosis, unspecified: Secondary | ICD-10-CM | POA: Diagnosis present

## 2020-07-16 ENCOUNTER — Encounter

## 2020-07-17 MED ORDER — METOPROLOL TARTRATE 25 MG PO TABS
25 MG | ORAL_TABLET | ORAL | 3 refills | Status: DC
Start: 2020-07-17 — End: 2021-06-17

## 2020-07-17 MED ORDER — LISINOPRIL 2.5 MG PO TABS
2.5 MG | ORAL_TABLET | ORAL | 3 refills | Status: AC
Start: 2020-07-17 — End: 2021-06-17

## 2020-07-17 MED ORDER — CLOPIDOGREL BISULFATE 75 MG PO TABS
75 MG | ORAL_TABLET | ORAL | 3 refills | Status: AC
Start: 2020-07-17 — End: 2021-06-17

## 2020-07-19 ENCOUNTER — Ambulatory Visit: Payer: Commercial Managed Care - PPO

## 2020-07-23 NOTE — Telephone Encounter (Signed)
Returned call and advised that he will be able to still get his medications. He voiced understanding.

## 2020-07-23 NOTE — Telephone Encounter (Signed)
Gregory Escobar requests that office return their call. The best time to reach him is Anytime. Patient states can not get stress test done in June due to he has not met deductible yet and it will cost him $1400 out of pocket. He states he does intend to have the testing done but just not right now and he is concerned he will not be able to get refills on his heart medicines due to not having the testing, he would like to speak with nurse to see if he will still be able to have his medicine.    Thank you.

## 2020-08-13 ENCOUNTER — Ambulatory Visit: Payer: BLUE CROSS/BLUE SHIELD | Primary: Family Medicine

## 2020-09-24 MED ORDER — ATORVASTATIN CALCIUM 40 MG PO TABS
40 MG | ORAL_TABLET | ORAL | 0 refills | Status: DC
Start: 2020-09-24 — End: 2020-12-13

## 2020-09-24 NOTE — Telephone Encounter (Signed)
The pt called to see if he was due to have labs done.

## 2020-09-24 NOTE — Telephone Encounter (Signed)
Spoke with patient and advised that labs will be due again in October and he will be able to get his refill for cholesterol medications.

## 2020-12-13 ENCOUNTER — Encounter
Admit: 2020-12-13 | Discharge: 2020-12-13 | Payer: BLUE CROSS/BLUE SHIELD | Attending: Cardiovascular Disease | Primary: Family Medicine

## 2020-12-13 DIAGNOSIS — E785 Hyperlipidemia, unspecified: Secondary | ICD-10-CM

## 2020-12-13 MED ORDER — ATORVASTATIN CALCIUM 40 MG PO TABS
40 MG | ORAL_TABLET | ORAL | 0 refills | Status: DC
Start: 2020-12-13 — End: 2021-01-28

## 2020-12-13 NOTE — Progress Notes (Signed)
[facility]  Cardiology Office Note  [address], [place]  [address]  Phone: [phone]  Fax: [phone]                            Date:  12/13/2020  Patient: Gregory Escobar  Age:  50 y.o., [birth year]    Referral: No ref. provider found      PROBLEM LIST:    Patient Active Problem List    Diagnosis Date Noted    History of myocardial infarction 11/27/2015     Priority: Low    Decreased left ventricular systolic function 11/27/2015     Priority: Low     Overview Note:     05/03/14 2D echo showed EF at 35-40%      Smoker 11/27/2015     Priority: Low    HTN (hypertension)      Priority: Low    Hyperlipemia      Priority: Low    Cardiac LV ejection fraction 30-35%      Priority: Low     Overview Note:     MI 03/13/14 cath revealed EF 30-35 at that time      Coronary artery disease involving native coronary artery 03/13/2014     Priority: Low    Acute MI, anterior wall (HCC) 03/13/2014     Priority: Low     1.  Coronary artery disease, prior anteroseptal wall MI 03/13/2014 with occluded mid LAD, PCI with 4.0 x 23 mm Xience, ejection fraction 35 to 40%, dominant circumflex with branch vessel disease.  2.  Active ongoing tobacco use.  3.  History of prior EtOH and drug abuse stopped 2010.  4.  Family history of premature CAD involving his father side.      PRESENTATION: Gregory Escobar is a 50 y.o. year old male presents for follow-up evaluation.  He has been doing well over the last 6 years since his MI with no recurrent symptoms.  He did have chest tightness at presentation which began in his left upper arm.  Unfortunately has not been able to stop tobacco use.  He has a history of drug abuse and EtOH abuse in the past and is afraid that he if he stops he will gain weight.  He is still working and is active.  No significant restriction in activity level.  No leg swelling.  Compliant with his medications.  Has not done a stress test as the co-pay is too high for him to  afford.    REVIEW OF SYSTEMS:  Review of Systems   Constitutional:  Negative for activity change, diaphoresis and fatigue.   HENT:  Negative for hearing loss, nosebleeds and tinnitus.    Eyes:  Negative for visual disturbance.   Respiratory:  Negative for cough, shortness of breath and wheezing.    Cardiovascular:  Negative for chest pain, palpitations and leg swelling.   Gastrointestinal:  Negative for abdominal distention, abdominal pain, blood in stool, diarrhea and vomiting.   Endocrine: Negative for cold intolerance, heat intolerance, polydipsia, polyphagia and polyuria.   Genitourinary:  Negative for difficulty urinating, flank pain and hematuria.   Musculoskeletal:  Negative for arthralgias, back pain, joint swelling and myalgias.   Skin:  Negative for pallor and rash.   Neurological:  Negative for dizziness, seizures, syncope and headaches.   Psychiatric/Behavioral:  Negative for behavioral problems and dysphoric mood. The patient is not  nervous/anxious.      Past Medical History:      Diagnosis Date    Acute MI, anterior wall (HCC) 03/13/14    CAD (coronary artery disease) 2016    Cardiac LV ejection fraction 30-35%     MI 03/13/14 cath revealed EF 30-35 at that time    HTN (hypertension)     Hyperlipemia     Kidney stones 05/2013       Past Surgical History:      Procedure Laterality Date    CARDIAC CATHETERIZATION  2016    Stent to LAD    LITHOTRIPSY  2015    URETER STENT PLACEMENT  2015       Medications:  Current Outpatient Medications   Medication Sig Dispense Refill    atorvastatin (LIPITOR) 40 MG tablet Take 1 tablet by mouth once daily 90 tablet 0    lisinopril (PRINIVIL;ZESTRIL) 2.5 MG tablet TAKE 1 TABLET BY MOUTH  TWICE DAILY 180 tablet 3    metoprolol tartrate (LOPRESSOR) 25 MG tablet TAKE 1 TABLET BY MOUTH  TWICE DAILY 180 tablet 3    clopidogrel (PLAVIX) 75 MG tablet TAKE 1 TABLET BY MOUTH ONCE DAILY 90 tablet 3    nitroGLYCERIN (NITROSTAT) 0.4 MG SL tablet Place 1 tablet under the tongue every 5  minutes as needed for Chest pain (if you have to take 3 then go the ER) 25 tablet 3    Multiple Vitamins-Minerals (THERAPEUTIC MULTIVITAMIN-MINERALS) tablet Take 1 tablet by mouth daily       No current facility-administered medications for this visit.       Allergies:  Patient has no known allergies.    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    Sexual activity: Yes     Partners: Female   Other Topics Concern    Not on file   Social History Narrative    Not on file     Social Determinants of Health     Financial Resource Strain: Not on file   Food Insecurity: Not on file   Transportation Needs: Not on file   Physical Activity: Not on file   Stress: Not on file   Social Connections: Not on file   Intimate Partner Violence: Not on file   Housing Stability: Not on file       Family History:       Problem Relation Age of Onset    Heart Disease Father     High Blood Pressure Father     High Cholesterol Father          Physical Examination:  BP 112/64  Pulse 50   Ht 5\' 7"  (1.702 m)   Wt 162 lb (73.5 kg)   BMI 25.37 kg/m   Physical Exam  Constitutional:       Comments: Normal build  Blood pressure right arm sitting 110/60 mmHg, pulse 68 bpm regular   HENT:      Mouth/Throat:      Pharynx: No oropharyngeal exudate.   Eyes:      General: No scleral icterus.        Right eye: No discharge.         Left eye: No discharge.   Neck:      Thyroid: No  thyromegaly.      Vascular: No JVD.   Cardiovascular:      Rate and Rhythm: Normal rate and regular rhythm.      Heart sounds: No murmur heard.    No friction rub. No gallop.      Comments: No JVD  No edema  No significant systolic or diastolic murmurs noted  Pulmonary:      Effort: No respiratory distress.      Breath sounds: No stridor. No wheezing or rales.   Abdominal:      General: Bowel sounds are normal. There is no distension.      Palpations: Abdomen is soft. There is no mass.      Tenderness: There is no abdominal tenderness. There is no guarding or rebound.      Comments: Soft, nontender  No palpable organomegaly   Musculoskeletal:         General: No deformity.   Skin:     General: Skin is warm.      Coloration: Skin is not pale.      Findings: No erythema or rash.   Neurological:      Mental Status: He is alert and oriented to person, place, and time.      Motor: No abnormal muscle tone.      Coordination: Coordination normal.      Deep Tendon Reflexes: Reflexes normal.         Labs:   CBC: No results for input(s): WBC, HGB, HCT, PLT in the last 72 hours.  BMP:No results for input(s): NA, K, CO2, BUN, CREATININE, LABGLOM, GLUCOSE in the last 72 hours.  BNP: No results for input(s): BNP in the last 72 hours.  PT/INR: No results for input(s): PROTIME, INR in the last 72 hours.  APTT:No results for input(s): APTT in the last 72 hours.  CARDIAC ENZYMES:No results for input(s): CKTOTAL, CKMB, CKMBINDEX, TROPONINI in the last 72 hours.  FASTING LIPID PANEL:  Lab Results   Component Value Date/Time    HDL 40 12/04/2019 09:09 AM    LDLDIRECT 71 03/14/2014 07:04 PM    LDLCALC 87 12/04/2019 09:09 AM    TRIG 67 12/04/2019 09:09 AM     LIVER PROFILE:No results for input(s): AST, ALT, LABALBU in the last 72 hours.        Imaging:          ASSESSMENT and PLAN:    50 year old gentleman with past medical history of longstanding and ongoing tobacco use, family history of premature CAD, prior history of EtOH and drug  abuse quit 2010, coronary artery disease with acute anteroseptal MI 03/2014 with occluded mid LAD treated with DES x1, ejection fraction 35 to 40% with dominant circumflex/branch vessel disease, here for follow-up evaluation.    1.  Have discussed with patient the absolute need for tobacco cessation.  He does have a strong family history of premature CAD as well.  This would be the single best intervention he could make.  2.  It has been 6 years since his MI.  Would repeat an echocardiogram and assess his LV function.  Continue current medications unchanged.  Maintain on Plavix.  Unfortunately is unable to afford the stress test due to elevated co-pay.  At this time we will continue to monitor him clinically.  3.  Advised on diet and activity.  4.  Can follow-up with nurse practitioner in 6 months and with me in 1 year.    Orders:  Orders Placed This Encounter   Procedures    Lipid Panel    ALT    AST    EKG 12 lead    Echo 2d w doppler w color w contrast     Orders Placed This Encounter   Medications    atorvastatin (LIPITOR) 40 MG tablet     Sig: Take 1 tablet by mouth once daily     Dispense:  90 tablet     Refill:  0             Return for NP 6 mths; me 1 year.      Electronically signed by Patrick Jupiter, MD on 12/13/2020 at 12:29 PM    [facility]      Thisdictation was generated by voice recognition computer software.  Although all attempts are made to edit the dictation for accuracy, there may be errors in the transcription that are not intended.

## 2020-12-19 NOTE — Telephone Encounter (Signed)
Lab orders were faxed to Dr. Marylen Ponto office on 12/19/20 per patient request.

## 2020-12-19 NOTE — Telephone Encounter (Signed)
Patient states he would like his lab orders sent to his PCP [name] in [facility] so he can have them drawn there.     Thank you

## 2021-01-01 NOTE — Telephone Encounter (Signed)
error 

## 2021-01-17 ENCOUNTER — Ambulatory Visit: Payer: BLUE CROSS/BLUE SHIELD | Primary: Family Medicine

## 2021-01-17 DIAGNOSIS — I2510 Atherosclerotic heart disease of native coronary artery without angina pectoris: Secondary | ICD-10-CM

## 2021-01-17 LAB — ECHOCARDIOGRAM 2D W DOPPLER W COLOR W CONTRAST: Left Ventricular Ejection Fraction: 53

## 2021-01-17 MED ORDER — PERFLUTREN LIPID MICROSPHERE IV SUSP
Freq: Once | INTRAVENOUS | Status: DC | PRN
Start: 2021-01-17 — End: 2021-01-18
  Administered 2021-01-17 (×2): 1.5 mL via INTRAVENOUS

## 2021-01-20 NOTE — Telephone Encounter (Signed)
Tried to reach patient, no answer, left v/m to return call.

## 2021-01-20 NOTE — Telephone Encounter (Signed)
-----   Message from Patrick Jupiter, MD sent at 01/17/2021  3:14 PM CST -----  Please let patient know that his heart function is improved and borderline normal.  Mild abnormality involving area of prior heart attack but overall function is improved to 50% from previous value of 40% (normal 55 to 60%).

## 2021-01-20 NOTE — Telephone Encounter (Signed)
Called and spoke with Stephanie Acre, gave results, verbally understood.

## 2021-01-28 MED ORDER — ATORVASTATIN CALCIUM 80 MG PO TABS
80 MG | ORAL_TABLET | ORAL | 3 refills | Status: AC
Start: 2021-01-28 — End: 2021-04-22

## 2021-01-28 NOTE — Telephone Encounter (Signed)
Pt called stating he wanted his labs results from Nov 1. Went over results with pt and advised to increase his lipitor. Updated his med list and sent new script for him.

## 2021-02-05 DIAGNOSIS — I10 Essential (primary) hypertension: Secondary | ICD-10-CM | POA: Diagnosis not present

## 2021-02-05 DIAGNOSIS — E785 Hyperlipidemia, unspecified: Secondary | ICD-10-CM | POA: Diagnosis not present

## 2021-02-05 DIAGNOSIS — R5383 Other fatigue: Secondary | ICD-10-CM | POA: Diagnosis not present

## 2021-02-05 DIAGNOSIS — R7303 Prediabetes: Secondary | ICD-10-CM | POA: Diagnosis not present

## 2021-02-05 DIAGNOSIS — L400 Psoriasis vulgaris: Secondary | ICD-10-CM | POA: Diagnosis not present

## 2021-02-13 DIAGNOSIS — L400 Psoriasis vulgaris: Secondary | ICD-10-CM | POA: Diagnosis not present

## 2021-02-19 DIAGNOSIS — Z1212 Encounter for screening for malignant neoplasm of rectum: Secondary | ICD-10-CM | POA: Diagnosis not present

## 2021-02-19 DIAGNOSIS — Z1211 Encounter for screening for malignant neoplasm of colon: Secondary | ICD-10-CM | POA: Diagnosis not present

## 2021-03-20 ENCOUNTER — Encounter

## 2021-03-20 NOTE — Telephone Encounter (Signed)
This encounter was created in error - please disregard.

## 2021-04-07 DIAGNOSIS — L400 Psoriasis vulgaris: Secondary | ICD-10-CM | POA: Diagnosis not present

## 2021-04-07 NOTE — Telephone Encounter (Signed)
Nareg called to speak with a nurse regarding stopping medication before colonoscopy. Please advise.

## 2021-04-08 NOTE — Telephone Encounter (Signed)
Called and spoke with patient, advised he would need to speak with his GI doc to see if they want him to stop anything prior to procedure.  Advised if they wanted our opinion they would need to fax a clearance. Patient verbally understood.

## 2021-04-22 MED ORDER — ATORVASTATIN CALCIUM 80 MG PO TABS
80 MG | ORAL_TABLET | ORAL | 0 refills | Status: DC
Start: 2021-04-22 — End: 2022-01-26

## 2021-04-22 NOTE — Telephone Encounter (Signed)
Patient requesting return call from office in regards to medication refills. Issue with insurance , need 90 day supply.     Please return his call to discuss [phone]      Thank you

## 2021-04-22 NOTE — Telephone Encounter (Signed)
Needs repeat lipids

## 2021-04-22 NOTE — Telephone Encounter (Signed)
This has already been taken care of.

## 2021-06-13 ENCOUNTER — Encounter: Payer: BLUE CROSS/BLUE SHIELD | Attending: Clinical Nurse Specialist | Primary: Family Medicine

## 2021-06-16 ENCOUNTER — Encounter

## 2021-06-17 ENCOUNTER — Encounter

## 2021-06-17 MED ORDER — LISINOPRIL 2.5 MG PO TABS
2.5 MG | ORAL_TABLET | ORAL | 3 refills | Status: AC
Start: 2021-06-17 — End: 2022-05-18

## 2021-06-17 MED ORDER — CLOPIDOGREL BISULFATE 75 MG PO TABS
75 MG | ORAL_TABLET | ORAL | 3 refills | Status: AC
Start: 2021-06-17 — End: 2022-05-18

## 2021-06-17 MED ORDER — METOPROLOL TARTRATE 25 MG PO TABS
25 MG | ORAL_TABLET | ORAL | 1 refills | Status: AC
Start: 2021-06-17 — End: ?

## 2021-07-01 DIAGNOSIS — R7303 Prediabetes: Secondary | ICD-10-CM | POA: Diagnosis not present

## 2021-07-01 DIAGNOSIS — I10 Essential (primary) hypertension: Secondary | ICD-10-CM | POA: Diagnosis not present

## 2021-07-01 DIAGNOSIS — E785 Hyperlipidemia, unspecified: Secondary | ICD-10-CM | POA: Diagnosis not present

## 2021-07-18 ENCOUNTER — Ambulatory Visit
Admit: 2021-07-18 | Discharge: 2021-07-18 | Payer: BLUE CROSS/BLUE SHIELD | Attending: Clinical Nurse Specialist | Primary: Family Medicine

## 2021-07-18 DIAGNOSIS — I2510 Atherosclerotic heart disease of native coronary artery without angina pectoris: Secondary | ICD-10-CM

## 2021-07-18 NOTE — Progress Notes (Unsigned)
[facility]  [address], [place]  [address]  Phone: [phone]  Fax: [phone]    OFFICE VISIT:  07/18/2021    Gregory Escobar - DOB: 1971/03/02    Reason For Visit:  Gregory Escobar is a 51 y.o. male who is here for 6 Month Follow-Up (No symptoms) and Coronary Artery Disease  1.  Coronary artery disease, prior anteroseptal wall MI 03/13/2014 with occluded mid LAD, PCI with 4.0 x 23 mm Xience, ejection fraction 35 to 40%, dominant circumflex with branch vessel disease.  2.  Active ongoing tobacco use.  3.  History of prior EtOH and drug abuse stopped 2010.  4.  Family history of premature CAD involving his father side.    Most recent echo November 2022 showed normal LV size and function with EF 50 to 55% with some mild hypokinesis of the apex and mid apical anteroseptal walls.  No significant valve disease noted    Subjective  Gregory Escobar denies exertional chest pain, shortness of breath, orthopnea, paroxysmal nocturnal dyspnea, syncope, presyncope, arrhythmia, edema and fatigue.  The patient denies numbness or weakness to suggest cerebrovascular accident or transient ischemic attack.    [name] is PCP.  Had labs checked this AM- will fax to [place].     Gregory Escobar has the following history as recorded in EpicCare:    Patient Active Problem List    Diagnosis Date Noted    History of myocardial infarction 11/27/2015    Decreased left ventricular systolic function 11/27/2015    Smoker 11/27/2015    HTN (hypertension)     Hyperlipemia     Cardiac LV ejection fraction 30-35%     Coronary artery disease involving native coronary artery 03/13/2014    Acute MI, anterior wall (HCC) 03/13/2014     Past Medical History:   Diagnosis Date    Acute MI, anterior wall (HCC) 03/13/14    CAD (coronary artery disease) 2016    Cardiac LV ejection fraction 30-35%     MI 03/13/14 cath revealed EF 30-35 at that time    HTN (hypertension)     Hyperlipemia     Kidney stones 05/2013     Past Surgical History:   Procedure  Laterality Date    CARDIAC CATHETERIZATION  2016    Stent to LAD    LITHOTRIPSY  2015    URETER STENT PLACEMENT  2015     Family History   Problem Relation Age of Onset    Heart Disease Father     High Blood Pressure Father     High Cholesterol Father      Social History     Tobacco Use    Smoking status: Every Day     Packs/day: 0.50     Years: 20.00     Pack years: 10.00     Types: Cigarettes    Smokeless tobacco: Former     Types: Snuff     Quit date: 05/22/1990   Substance Use Topics    Alcohol use: No     Comment: recovering addict      Current Outpatient Medications   Medication Sig Dispense Refill    metoprolol tartrate (LOPRESSOR) 25 MG tablet TAKE 1 TABLET BY MOUTH  TWICE DAILY 180 tablet 1    clopidogrel (PLAVIX) 75 MG tablet TAKE 1 TABLET BY MOUTH ONCE DAILY 90 tablet 3    lisinopril (PRINIVIL;ZESTRIL) 2.5 MG tablet TAKE 1 TABLET BY MOUTH  TWICE DAILY 180  tablet 3    atorvastatin (LIPITOR) 80 MG tablet Take 1 tablet by mouth once daily 90 tablet 0    nitroGLYCERIN (NITROSTAT) 0.4 MG SL tablet Place 1 tablet under the tongue every 5 minutes as needed for Chest pain (if you have to take 3 then go the ER) 25 tablet 3    Multiple Vitamins-Minerals (THERAPEUTIC MULTIVITAMIN-MINERALS) tablet Take 1 tablet by mouth daily       No current facility-administered medications for this visit.     Allergies: Patient has no known allergies.    Review of Systems  Constitutional - no significant activity change, appetite change, or unexpected weight change. No fever, chills or diaphoresis.  No fatigue.   HEENT - no significant rhinorrhea or epistaxis. No tinnitus or significant hearing loss.   Eyes - no sudden vision change or amaurosis.   Respiratory - no significant wheezing, stridor, apnea or cough.  No dyspnea on exertion or shortness of breath.  Cardiovascular - no exertional chest pain, orthopnea or PND.  No sensation of arrhythmia or slow heart rate.   No claudication or leg edema.  Gastrointestinal - no abdominal  swelling or pain. No blood in stool. No severe constipation, diarrhea, nausea, or vomiting.   Genitourinary - no difficulty urinating, dysuria, frequency, or urgency. No flank pain or hematuria.   Musculoskeletal - no back pain, gait disturbance, or myalgia.   Skin - no color change or rash.  No pallor.  No new surgical incision.  Neurologic - no speech difficulty, facial asymmetry or lateralizing weakness.  No seizures, presyncope, syncope, or significant dizziness.  Hematologic - no easy bruising or excessive bleeding.   Psychiatric - no severe anxiety or insomnia.  No confusion.   All other review of systems are negative.      Objective  Vital Signs - BP 128/68   Pulse 57   Ht 5\' 7"  (1.702 m)   Wt 154 lb (69.9 kg)   SpO2 98%   BMI 24.12 kg/m   General - Gregory Escobar is alert, cooperative, and pleasant.  Well groomed.  No acute distress.    Body habitus is ***.  HEENT - The head is normocephalic. No circumoral cyanosis.  Dentition is normal. ***  EYES -  No Xanthelasma, no arcus senilis, no conjunctival hemorrhages or discharge.   Neck - Supple, without increased jugular venous pressures.  No carotid bruits.  No mass.   Respiratory - Lungs are clear bilaterally.  No wheezes or rales.  Normal effort without use of accessory muscles.  Cardiovascular - Heart has regular rhythm and rate. *** No murmurs, rubs or gallops.    + pedal pulses and no varicosities. ***     Abdominal -  Soft, nontender, nondistended.  Bowel sounds are intact.   Extremities - No clubbing, cyanosis, or *** edema.   Musculoskeletal -  No clubbing ***.  No Osler's nodes.   Gait normal ***.  No kyphosis or scoliosis.   Skin - *** no statis ulcers or dermatitis.  Neurological - No focal signs are identified.  Oriented to person, place and time.    Psychiatric -  Appropriate affect and mood.       Assessment:     Diagnosis Orders   1. Coronary artery disease involving native coronary artery of native heart without angina pectoris        2. Essential  hypertension        3. Mixed hyperlipidemia  Data:  BP Readings from Last 3 Encounters:   07/18/21 128/68   12/13/20 112/64   02/13/20 128/88    Pulse Readings from Last 3 Encounters:   07/18/21 57   12/13/20 50   02/13/20 55        Wt Readings from Last 3 Encounters:   07/18/21 154 lb (69.9 kg)   12/13/20 162 lb (73.5 kg)   02/13/20 158 lb (71.7 kg)     Blood pressure and heart rate well controlled.  Medical manage includes beta-blocker, ACE inhibitor, remains on Plavix and statin    He states he had upper back joint pain at night    *** Reviewed PCP recent notes  *** Reviewed recent labs      Echo November 2022 stable   Normal left ventricular size. Left ventricular ejection fraction is   visually estimated at 50-55%. Mild hypokinesis of apex and mid-apical   anteroseptal walls. Normal left ventricular wall thickness. Diastolic   function is indeterminate.   Normal right ventricular size with preserved RV function.   Normal bi-atrial size.   No clinically significant valvular abnormalities.   Aortic root dimension within normal limits.   No evidence of significant pericardial effusion is noted.   No previous studies.   -------------------------------------------   Electronically signed by Loni Muse Simone(Interpreting physician)   on 01/17/2021 12:29 PM    *** States taking medications as prescribed  ***Stable cardiovascular status. No evidence of overt heart failure, angina or dysrhythmia.   ***minutes were spent preparing, reviewing and seeing patient.  All questions answered    Plan    Recommend low dose CT scan due to smoking history   Strongly recommend smoking cessation     Hold the lipitor for 1-2 weeks to see if back pain improves- if no change then continue it  If improves then let us know and will try Crestor   Maintain good blood pressure control-goal<130/80 at rest  Maintain good cholesterol control LDL goal<70 with arterial disease  If you are diabetic work to keep/obtain hemoglobin A1c<  7    Follow up with Dr Urban Gibson as scheduled   Call with any questions or concerns  Follow up with [name] for non cardiac problems  Report any new problems  Cardiovascular Fitness-Exercise as tolerated.  Strive for 30 minutes of exercise most days of the week.    Cardiac / Healthy Diet- Avoid processed high fat foods, maintain low sodium/salt   Continue current medications as directed  Continue plan of treatment  It is always recommended that you bring your medications bottles with you to each visit - this is for your safety!       Octavia Bruckner, APRN    EMR dragon/transcription disclaimer: Much of this encounter note is electronic transcription/translation of spoken language to printed tach. Electronic translation of spoken language may be erroneous, or at times, nonsensical words or phrases may be inadvertently transcribed. Although, I have reviewed the note for such errors, some may still exist.

## 2021-07-18 NOTE — Patient Instructions (Signed)
Recommend low dose CT scan due to smoking history   Strongly recommend smoking cessation     Hold the lipitor for 1-2 weeks to see if back pain improves- if no change then continue it  If improves then let us know and will try Crestor   Maintain good blood pressure control-goal<130/80 at rest  Maintain good cholesterol control LDL goal<70 with arterial disease  If you are diabetic work to keep/obtain hemoglobin A1c< 7    Follow up with Dr Bess Harvest as scheduled   Call with any questions or concerns  Follow up with Ermalene Postin for non cardiac problems  Report any new problems  Cardiovascular Fitness-Exercise as tolerated.  Strive for 30 minutes of exercise most days of the week.    Cardiac / Healthy Diet- Avoid processed high fat foods, maintain low sodium/salt   Continue current medications as directed  Continue plan of treatment  It is always recommended that you bring your medications bottles with you to each visit - this is for your safety!           Tips to Help You Stop Smoking   Cigarette smoking is a preventable cause of death in the [place].  If you have thought about quitting but haven't been able to, here are some reasons why you should and some ways to do it.       Here's Why   Quitting smoking now can decrease your risk of getting smoking-related illnesses like:   Heart disease, Stroke,  Cataracts, Macular degeneration, Thyroid conditions, Hearing loss, Erectile dysfunction, Dementia, Osteoporosis.    Several types of cancer, including:   Lung, Mouth, Esophagus, Larynx, Bladder, Pancreas, Kidney   Chronic lung diseases:   Bronchitis, Emphysema, Asthma   Here's How   Once you've decided to quit smoking, set your target quit date a few weeks away. In the time leading up to your quit day, try some of these ideas offered by the [place] to help you successfully quit smoking.   For the best results, work with your doctor. Together, you can test your  lung function and compare the results to those of a nonsmoking person. The results can be given to you as your lung age. Finding out your lung age right after having the test done may help you to stop smoking.     Your doctor can also discuss with you all of your options and refer you to smoking-cessation support groups. You may wish to use nicotine replacement (gum, patches, inhaler) or one of the prescription medications that have been shown to increase quit rates and prolong abstinence from smoking. But whatever you and your doctor decide on these matters, it will still be you who decides when an how to quit. Here are some techniques:     Switch Brands   Switch to a brand you find distasteful.   Change to a brand that is low in tar and nicotine a couple of weeks before your target quit date. This will help change your smoking behavior. However, do not smoke more cigarettes, inhale them more often or more deeply, or place your fingertips over the holes in the filters. All of these actions will increase your nicotine intake, and the idea is to get your body used to functioning without nicotine.     Cut Down the Number of Cigarettes You Smoke   Smoke only half of each cigarette.   Each day, postpone the lighting  of your first cigarette by one hour.   Decide you'll only smoke during odd or even hours of the day.   Decide beforehand how many cigarettes you'll smoke during the day. For each additional cigarette, give a dollar to your favorite charity.   Change your eating habits to help you cut down. For example, drink milk, which many people consider incompatible with smoking. End meals or snacks with something that won't lead to a cigarette.   Reach for a glass of juice instead of a cigarette for a "pick-me-up."   Remember: Cutting down can help you quit, but it's not a substitute for quitting. If you're down to about seven cigarettes a day, it's time to set your target quit date, and get ready to stick to it.      Don't Smoke "Automatically"   Smoke only those cigarettes you really want. Catch yourself before you light up a cigarette out of pure habit.   Don't empty your ashtrays. This will remind you of how many cigarettes you've smoked each day, and the sight and the smell of stale cigarettes butts will be very unpleasant.   Make yourself aware of each cigarette by using the opposite hand or putting cigarettes in an unfamiliar location or a different pocket to break the automatic reach.   If you light up many times during the day without even thinking about it, try to look in a mirror each time you put a match to your cigarette. You may decide you don't need it.     Make Smoking Inconvenient   Stop buying cigarettes by the carton. Wait until one pack is empty before you buy another.   Stop carrying cigarettes with you at home or at work. Make them difficult to get to.     Make Smoking Unpleasant   Smoke only under circumstances that aren't especially pleasurable for you. If you like to smoke with others, smoke alone. Turn your chair to an empty corner and focus only on the cigarette you are smoking and all its many negative effects.   Collect all your cigarette butts in one large glass container as a visual reminder of the filth made by smoking.     Just Before Quitting   Practice going without cigarettes.   Don't think of never smoking again. Think of quitting in terms of one day at a time .   Tell yourself you won't smoke today, and then don't.   Clean your clothes to rid them of the cigarette smell, which can linger a long time.     On the Day You Quit   Throw away all your cigarettes and matches. Hide Environmental health practitioner.   Visit the dentist and have your teeth cleaned to get rid of tobacco stains. Notice how nice they look and resolve to keep them that way.   Make a list of things you'd like to buy for yourself or someone else. Estimate the cost in terms of packs of cigarettes, and put the money aside to buy  these presents.   Keep very busy on the big day. Go to the movies, exercise, take long walks, or go bike riding.   Remind your family and friends that this is your quit date, and ask them to help you over the rough spots of the first couple of days and weeks.   Buy yourself a treat or do something special to celebrate.     Telephone and Internet Support   Call the  [place] [employer].  Telephone, web-, and computer-based programs can offer you the support that you need to quit and to stay smoke-free. You can find many programs online.    Immediately After Quitting   Develop a clean, fresh, nonsmoking environment around yourself at work and at home. Buy yourself flowers you may be surprised how much you can enjoy their scent now.   The first few days after you quit, spend as much free time as possible in places where smoking isn't allowed, such as [place], museums, theaters, department stores, and churches.   Drink large quantities of water and fruit juice (but avoid sodas that contain caffeine).   Try to avoid alcohol, coffee, and other beverages that you associate with cigarette smoking.   Strike up conversation instead of a Orthoptist for a cigarette.   If you miss the sensation of having a cigarette in your hand, play with something else a pencil, a paper clip, a marble.   If you miss having something in your mouth, try toothpicks or a fake cigarette.

## 2021-07-22 NOTE — Telephone Encounter (Signed)
-----   Message from Gregory Ames, APRN sent at 07/22/2021  7:54 AM CDT -----  Let him know I got his cholesterol results back.  Total cholesterol was 151, HDL 49, LDL still little high at 93 and triglycerides 44    He can hold his Lipitor for couple weeks and see if myalgias improve and let us know     We discussed at office visit low-dose CT scan screening due to being a smoker.  I have ordered that.  You can give him number to scheduling to get that scheduled.  Thanks Baxter Flattery

## 2021-12-03 ENCOUNTER — Encounter

## 2021-12-04 MED ORDER — METOPROLOL TARTRATE 25 MG PO TABS
25 MG | ORAL_TABLET | ORAL | 3 refills | Status: AC
Start: 2021-12-04 — End: ?

## 2021-12-05 DIAGNOSIS — I10 Essential (primary) hypertension: Secondary | ICD-10-CM | POA: Diagnosis not present

## 2021-12-05 DIAGNOSIS — R7303 Prediabetes: Secondary | ICD-10-CM | POA: Diagnosis not present

## 2021-12-05 DIAGNOSIS — E782 Mixed hyperlipidemia: Secondary | ICD-10-CM | POA: Diagnosis not present

## 2021-12-05 DIAGNOSIS — R339 Retention of urine, unspecified: Secondary | ICD-10-CM | POA: Diagnosis not present

## 2021-12-12 ENCOUNTER — Encounter: Payer: BLUE CROSS/BLUE SHIELD | Attending: Cardiovascular Disease | Primary: Family Medicine

## 2021-12-12 ENCOUNTER — Ambulatory Visit
Admit: 2021-12-12 | Discharge: 2021-12-12 | Payer: BLUE CROSS/BLUE SHIELD | Attending: Clinical Nurse Specialist | Primary: Family Medicine

## 2021-12-12 DIAGNOSIS — I2510 Atherosclerotic heart disease of native coronary artery without angina pectoris: Secondary | ICD-10-CM

## 2021-12-12 MED ORDER — NITROGLYCERIN 0.4 MG SL SUBL
0.4 MG | ORAL_TABLET | SUBLINGUAL | 3 refills | Status: DC | PRN
Start: 2021-12-12 — End: 2022-09-25

## 2021-12-12 NOTE — Progress Notes (Signed)
[facility]  [address], [place]  [address]  Phone: [phone]  Fax: [phone]    OFFICE VISIT:  12/12/2021    Gregory Escobar - DOB: [birth year]    Reason For Visit:  Gregory Escobar is a 51 y.o. male who is here for 6 Month Follow-Up (No symptoms) and Coronary Artery Disease  1.  Coronary artery disease, prior anteroseptal wall MI 03/13/2014 with occluded mid LAD, PCI with 4.0 x 23 mm Xience, ejection fraction 35 to 40%, dominant circumflex with branch vessel disease.  2.  Active ongoing tobacco use.  3.  History of prior EtOH and drug abuse stopped 2010.  4.  Family history of premature CAD involving his father side.    Most recent echo November 2022 showed normal LV size and function with EF 50 to 55% with some mild hypokinesis of the apex and mid apical anteroseptal walls.  No significant valve disease noted    Was seen in May-on low dose CT scan due to smoking history  Discussed holding Lipitor to see if myalgias improved-he reports that he did not notice any difference off the Lipitor so he is back on it  States overall he is doing fairly well.  He still smoking    Subjective  Pratt denies exertional chest pain, shortness of breath, orthopnea, paroxysmal nocturnal dyspnea, syncope, presyncope, arrhythmia, edema and fatigue.  The patient denies numbness or weakness to suggest cerebrovascular accident or transient ischemic attack.    [name] is PCP.      [name] has the following history as recorded in EpicCare:    Patient Active Problem List    Diagnosis Date Noted    History of myocardial infarction 11/27/2015    Decreased left ventricular systolic function 11/27/2015    Smoker 11/27/2015    HTN (hypertension)     Hyperlipemia     Cardiac LV ejection fraction 30-35%     Coronary artery disease involving native coronary artery 03/13/2014    Acute MI, anterior wall (HCC) 03/13/2014     Past Medical History:   Diagnosis Date    Acute MI, anterior wall (HCC) 03/13/14    CAD  (coronary artery disease) 2016    Cardiac LV ejection fraction 30-35%     MI 03/13/14 cath revealed EF 30-35 at that time    HTN (hypertension)     Hyperlipemia     Kidney stones 05/2013     Past Surgical History:   Procedure Laterality Date    CARDIAC CATHETERIZATION  2016    Stent to LAD    LITHOTRIPSY  2015    URETER STENT PLACEMENT  2015     Family History   Problem Relation Age of Onset    Heart Disease Father     High Blood Pressure Father     High Cholesterol Father      Social History     Tobacco Use    Smoking status: Every Day     Packs/day: 0.50     Years: 20.00     Additional pack years: 0.00     Total pack years: 10.00     Types: Cigarettes    Smokeless tobacco: Former     Types: Snuff     Quit date: 05/22/1990   Substance Use Topics    Alcohol use: No     Comment: recovering addict      Current Outpatient Medications   Medication Sig Dispense Refill  nitroGLYCERIN (NITROSTAT) 0.4 MG SL tablet Place 1 tablet under the tongue every 5 minutes as needed for Chest pain (if you have to take 3 then go the ER) 25 tablet 3    metoprolol tartrate (LOPRESSOR) 25 MG tablet TAKE 1 TABLET BY MOUTH TWICE  DAILY 180 tablet 3    clopidogrel (PLAVIX) 75 MG tablet TAKE 1 TABLET BY MOUTH ONCE DAILY 90 tablet 3    lisinopril (PRINIVIL;ZESTRIL) 2.5 MG tablet TAKE 1 TABLET BY MOUTH  TWICE DAILY 180 tablet 3    atorvastatin (LIPITOR) 80 MG tablet Take 1 tablet by mouth once daily 90 tablet 0    Multiple Vitamins-Minerals (THERAPEUTIC MULTIVITAMIN-MINERALS) tablet Take 1 tablet by mouth daily       No current facility-administered medications for this visit.     Allergies: Patient has no known allergies.    Review of Systems  Constitutional - no significant activity change, appetite change, or unexpected weight change. No fever, chills or diaphoresis.  No fatigue.   HEENT - no significant rhinorrhea or epistaxis. No tinnitus or significant hearing loss.   Eyes - no sudden vision change or amaurosis.   Respiratory - no  significant wheezing, stridor, apnea or cough.  No dyspnea on exertion or shortness of breath.  Cardiovascular - no exertional chest pain, orthopnea or PND.  No sensation of arrhythmia or slow heart rate.   No claudication or leg edema.  Gastrointestinal - no abdominal swelling or pain. No blood in stool. No severe constipation, diarrhea, nausea, or vomiting.   Genitourinary - no difficulty urinating, dysuria, frequency, or urgency. No flank pain or hematuria.   Musculoskeletal - no back pain, gait disturbance,  + myalgia.   Skin - no color change or rash.  No pallor.  No new surgical incision.  Neurologic - no speech difficulty, facial asymmetry or lateralizing weakness.  No seizures, presyncope, syncope, or significant dizziness.  Hematologic - no easy bruising or excessive bleeding.   Psychiatric - no severe anxiety or insomnia.  No confusion.   All other review of systems are negative.      Objective  Vital Signs - BP 118/74   Pulse (!) 49   Ht 5\' 7"  (1.702 m)   Wt 160 lb (72.6 kg)   BMI 25.06 kg/m   General - Caspar is alert, cooperative, and pleasant.  Well groomed.  No acute distress.    Body habitus is normal.  HEENT - The head is normocephalic. No circumoral cyanosis.  Dentition is normal.   EYES -  No Xanthelasma, no arcus senilis, no conjunctival hemorrhages or discharge.   Neck - Supple, without increased jugular venous pressures.  No carotid bruits.  No mass.   Respiratory - Lungs are clear bilaterally.  No wheezes or rales.  Normal effort without use of accessory muscles.  Cardiovascular - Heart has regular rhythm and rate.  No murmurs, rubs or gallops.    + pedal pulses and no varicosities.      Abdominal -  Soft, nontender, nondistended.  Bowel sounds are intact.   Extremities - No clubbing, cyanosis, or  edema.   Musculoskeletal -  No clubbing .  No Osler's nodes.   Gait normal .  No kyphosis or scoliosis.   Skin -  no statis ulcers or dermatitis.  Neurological - No focal signs are identified.   Oriented to person, place and time.    Psychiatric -  Appropriate affect and mood.       Assessment:  Diagnosis Orders   1. Coronary artery disease involving native coronary artery of native heart without angina pectoris        2. Essential hypertension  EKG 12 lead      3. Mixed hyperlipidemia  Lipid Panel    Comprehensive Metabolic Panel      4. Smoker          Data:  BP Readings from Last 3 Encounters:   12/12/21 118/74   07/18/21 128/68   12/13/20 112/64    Pulse Readings from Last 3 Encounters:   12/12/21 (!) 49   07/18/21 57   12/13/20 50        Wt Readings from Last 3 Encounters:   12/12/21 160 lb (72.6 kg)   07/18/21 154 lb (69.9 kg)   12/13/20 162 lb (73.5 kg)     EKG today shows bradycardia with a rate of 49.  Anterior infarct.  No change from previous EKG    Blood pressure and heart rate well controlled.  Medical manage includes beta-blocker, ACE inhibitor, remains on Plavix and statin    He did a trial holding his Lipitor with no change in his musculoskeletal discomfort.  He is back on it    Increase Lipitor dose last year.  We will have him get fasting labs soon to recheck    Patient continues to smoke.  Discussed the importance of complete smoking cessation.  Patient states he has never had a CT scan as screening for lung cancer.  Recommend      Echo November 2022 stable   Normal left ventricular size. Left ventricular ejection fraction is   visually estimated at 50-55%. Mild hypokinesis of apex and mid-apical   anteroseptal walls. Normal left ventricular wall thickness. Diastolic   function is indeterminate.   Normal right ventricular size with preserved RV function.   Normal bi-atrial size.   No clinically significant valvular abnormalities.   Aortic root dimension within normal limits.   No evidence of significant pericardial effusion is noted.   No previous studies.   -------------------------------------------   Electronically signed by Loni Muse Simone(Interpreting physician)   on 01/17/2021  12:29 PM     States taking medications as prescribed  Stable cardiovascular status. No evidence of overt heart failure, angina or dysrhythmia.   30 minutes were spent preparing, reviewing and seeing patient.  All questions answered    Plan    Fasting labs soon   Recommend low dose CT scan due to smoking history   Strongly recommend smoking cessation     Maintain good blood pressure control-goal<130/80 at rest  Maintain good cholesterol control LDL goal<70 with arterial disease  If you are diabetic work to keep/obtain hemoglobin A1c< 7    Follow up 9 mos with DR Urban Gibson   Call with any questions or concerns  Follow up with [name] for non cardiac problems  Report any new problems  Cardiovascular Fitness-Exercise as tolerated.  Strive for 30 minutes of exercise most days of the week.    Cardiac / Healthy Diet- Avoid processed high fat foods, maintain low sodium/salt   Continue current medications as directed  Continue plan of treatment  It is always recommended that you bring your medications bottles with you to each visit - this is for your safety!       Octavia Bruckner, APRN    EMR dragon/transcription disclaimer: Much of this encounter note is electronic transcription/translation of spoken language to printed tach. Electronic translation of spoken language may be  erroneous, or at times, nonsensical words or phrases may be inadvertently transcribed. Although, I have reviewed the note for such errors, some may still exist.

## 2021-12-12 NOTE — Patient Instructions (Signed)
Fasting labs soon   Recommend low dose CT scan due to smoking history   Strongly recommend smoking cessation     Maintain good blood pressure control-goal<130/80 at rest  Maintain good cholesterol control LDL goal<70 with arterial disease  If you are diabetic work to keep/obtain hemoglobin A1c< 7    Follow up 9 mos with DR Bess Harvest   Call with any questions or concerns  Follow up with Ermalene Postin for non cardiac problems  Report any new problems  Cardiovascular Fitness-Exercise as tolerated.  Strive for 30 minutes of exercise most days of the week.    Cardiac / Healthy Diet- Avoid processed high fat foods, maintain low sodium/salt   Continue current medications as directed  Continue plan of treatment  It is always recommended that you bring your medications bottles with you to each visit - this is for your safety!

## 2021-12-30 ENCOUNTER — Encounter: Payer: Self-pay | Admitting: Emergency Medicine

## 2021-12-30 ENCOUNTER — Emergency Department
Admission: EM | Admit: 2021-12-30 | Discharge: 2021-12-31 | Disposition: A | Discharge status: Home / Self Care | Payer: 59 | Attending: Emergency Medicine | Admitting: Emergency Medicine

## 2021-12-30 ENCOUNTER — Other Ambulatory Visit: Payer: Self-pay

## 2021-12-30 ENCOUNTER — Emergency Department: Payer: 59

## 2021-12-30 DIAGNOSIS — R519 Headache, unspecified: Secondary | ICD-10-CM | POA: Insufficient documentation

## 2021-12-30 DIAGNOSIS — I2510 Atherosclerotic heart disease of native coronary artery without angina pectoris: Secondary | ICD-10-CM | POA: Diagnosis not present

## 2021-12-30 DIAGNOSIS — R112 Nausea with vomiting, unspecified: Secondary | ICD-10-CM | POA: Diagnosis not present

## 2021-12-30 DIAGNOSIS — R42 Dizziness and giddiness: Secondary | ICD-10-CM | POA: Diagnosis not present

## 2021-12-30 DIAGNOSIS — I10 Essential (primary) hypertension: Secondary | ICD-10-CM | POA: Insufficient documentation

## 2021-12-30 MED ORDER — LACTATED RINGERS IV BOLUS
1000.0000 mL | Freq: Once | INTRAVENOUS | Status: AC
  Administered 2021-12-31: 1000 mL via INTRAVENOUS

## 2021-12-30 MED ORDER — ONDANSETRON HCL 4 MG/2ML IJ SOLN
4.0000 mg | Freq: Once | INTRAMUSCULAR | Status: AC
  Administered 2021-12-31: 4 mg via INTRAVENOUS
  Filled 2021-12-30: qty 2

## 2021-12-30 NOTE — ED Provider Notes (Signed)
[facility] Provider Note    Event Date/Time   First MD Initiated Contact with Patient 12/30/21 2328     (approximate)   History   Chief Complaint Dizziness, Nausea, Emesis, and Headache   HPI  Martin Ho is a 51 y.o. male with past medical history of psoriasis who presents to the ED complaining of dizziness and nausea.  Patient reports that he had eaten out at [employer] about 2 hours prior to arrival, was then woken from sleep about an hour prior to arrival with sudden onset headache, dizziness, nausea, and vomiting.  He describes the headache as posterior and throbbing, denies any associated vision changes, speech changes, numbness, or weakness.  He has felt nauseous and has vomited multiple times, denies any associated abdominal pain or diarrhea.  He has been feeling very dizzy and lightheaded since the onset of symptoms, but denies any sensation of the room spinning around him.  He denies any family members with similar symptoms.     Physical Exam   Triage Vital Signs: ED Triage Vitals [12/30/21 2325]  Enc Vitals Group     BP 138/67     Pulse Rate 61     Resp 20     Temp 98.4 F (36.9 C)     Temp Source Oral     SpO2 95 %     Weight (!) 305 lb (138.3 kg)     Height 6\' 7"  (2.007 m)     Head Circumference      Peak Flow      Pain Score 5     Pain Loc      Pain Edu?      Excl. in [place]?     Most recent vital signs: Vitals:   12/31/21 0100 12/31/21 0200  BP: 136/79 117/63  Pulse: 65 78  Resp: 15 17  Temp:    SpO2: 94% 97%    Constitutional: Alert and oriented. Eyes: Conjunctivae are normal. Head: Atraumatic. Nose: No congestion/rhinnorhea. Mouth/Throat: Mucous membranes are moist.  Cardiovascular: Normal rate, regular rhythm. Grossly normal heart sounds.  2+ radial pulses bilaterally. Respiratory: Normal respiratory effort.  No retractions. Lungs CTAB. Gastrointestinal: Soft and nontender. No distention. Musculoskeletal: No lower  extremity tenderness nor edema.  Neurologic:  Normal speech and language. No gross focal neurologic deficits are appreciated.    ED Results / Procedures / Treatments   Labs (all labs ordered are listed, but only abnormal results are displayed) Labs Reviewed  CBC WITH DIFFERENTIAL/PLATELET - Abnormal; Notable for the following components:      Result Value   WBC 13.8 (*)    Neutro Abs 9.4 (*)    Monocytes Absolute 1.4 (*)    All other components within normal limits  COMPREHENSIVE METABOLIC PANEL - Abnormal; Notable for the following components:   Glucose, Bld 108 (*)    All other components within normal limits  URINALYSIS, ROUTINE W REFLEX MICROSCOPIC - Abnormal; Notable for the following components:   Color, Urine YELLOW (*)    APPearance CLEAR (*)    All other components within normal limits  LIPASE, BLOOD  TROPONIN I (HIGH SENSITIVITY)  TROPONIN I (HIGH SENSITIVITY)     EKG  ED ECG REPORT I, Blake Divine, the attending physician, personally viewed and interpreted this ECG.   Date: 12/30/2021  EKG Time: 00:12  Rate: 57  Rhythm: normal sinus rhythm  Axis: Normal  Intervals:none  ST&T Change: None  RADIOLOGY CT head reviewed and interpreted by me  with no hemorrhage or midline shift.  PROCEDURES:  Critical Care performed: No  Procedures   MEDICATIONS ORDERED IN ED: Medications  lactated ringers bolus 1,000 mL (0 mLs Intravenous Stopped 12/31/21 0133)  ondansetron (ZOFRAN) injection 4 mg (4 mg Intravenous Given 12/31/21 0024)     IMPRESSION / MDM / ASSESSMENT AND PLAN / ED COURSE  I reviewed the triage vital signs and the nursing notes.                              51 y.o. male with past medical history of hypertension, hyperlipidemia, CAD, and GERD who presents to the ED complaining of sudden onset headache, lightheadedness, nausea, and vomiting after eating at [employer] about 2 hours prior to onset of symptoms.  Patient's presentation is most  consistent with acute presentation with potential threat to life or bodily function.  Differential diagnosis includes, but is not limited to, SAH, migraine headache, arrhythmia, ACS, gastroenteritis, bowel obstruction, gastritis, electrolyte abnormality, AKI, pancreatitis, hepatitis, UTI.  Patient well-appearing and in no acute distress, vital signs are unremarkable and he has a nonfocal neurologic exam.  Given acute onset headache with vomiting and dizziness, we will check CT head to rule out SAH.  Also screen EKG but overall low suspicion for cardiac etiology for symptoms.  He has a benign abdominal exam, will screen CBC, CMP, lipase, and urinalysis but no indication for CT imaging of huis abdomen at this time.  Plan to treat symptomatically with IV Zofran and fluids, reassess following labs and imaging.  CT head is negative for acute process, labs are reassuring with no significant anemia, leukocytosis, electrolyte abnormality, or AKI.  LFTs and lipase are also unremarkable, troponin within normal limits.  Patient reports feeling better following IV fluids and Zofran, has now been able to tolerate water and crackers without difficulty.  Given reassuring work-up, he is appropriate for discharge home with PCP follow-up, was counseled to return to the ED for new or worsening symptoms.  Patient agrees with plan.      FINAL CLINICAL IMPRESSION(S) / ED DIAGNOSES   Final diagnoses:  Nausea and vomiting, unspecified vomiting type  Lightheadedness     Rx / DC Orders   ED Discharge Orders          Ordered    ondansetron (ZOFRAN-ODT) 4 MG disintegrating tablet  Every 8 hours PRN        12/31/21 0253             Note:  This document was prepared using Dragon voice recognition software and may include unintentional dictation errors.   Chesley Noon, MD 12/31/21 [phone]

## 2021-12-30 NOTE — ED Notes (Signed)
Pt ambulatory to rm 28 at this time. Pt sat on bed and became nauseous. Pt given a gown and a specimen container for a UA. Pt not placed on monitor at this time d/t pt vomiting. Kristi, RN made aware.

## 2021-12-30 NOTE — ED Triage Notes (Signed)
Pt arrived via POV with c/o nausea, vomiting, HA, c/o dizziness like the room is spinning, and feeling "jittery" sxs started around 30-40 mins ago.  Pt reports eating [employer] tonight about 90 mins prior to the start of sxs.  Denies any abd pain.

## 2021-12-31 LAB — COMPREHENSIVE METABOLIC PANEL
ALT: 28 U/L (ref 0–44)
AST: 33 U/L (ref 15–41)
Albumin: 3.8 g/dL (ref 3.5–5.0)
Alkaline Phosphatase: 47 U/L (ref 38–126)
Anion gap: 7 (ref 5–15)
BUN: 15 mg/dL (ref 6–20)
CO2: 28 mmol/L (ref 22–32)
Calcium: 9.3 mg/dL (ref 8.9–10.3)
Chloride: 104 mmol/L (ref 98–111)
Creatinine, Ser: 1.07 mg/dL (ref 0.61–1.24)
GFR, Estimated: >60 mL/min (ref >60)
Glucose, Bld: 108 mg/dL — ABNORMAL HIGH (ref 70–99)
Potassium: 3.5 mmol/L (ref 3.5–5.1)
Sodium: 139 mmol/L (ref 135–145)
Total Bilirubin: 0.5 mg/dL (ref 0.3–1.2)
Total Protein: 6.8 g/dL (ref 6.5–8.1)

## 2021-12-31 LAB — URINALYSIS, ROUTINE W REFLEX MICROSCOPIC
Bilirubin Urine: NEGATIVE
Glucose, UA: NEGATIVE mg/dL
Hgb urine dipstick: NEGATIVE
Ketones, ur: NEGATIVE mg/dL
Leukocytes,Ua: NEGATIVE
Nitrite: NEGATIVE
Protein, ur: NEGATIVE mg/dL
Specific Gravity, Urine: 1.009 (ref 1.005–1.030)
pH: 7 (ref 5.0–8.0)

## 2021-12-31 LAB — CBC WITH DIFFERENTIAL/PLATELET
Abs Immature Granulocytes: 0.06 10*3/uL (ref 0.00–0.07)
Basophils Absolute: 0 10*3/uL (ref 0.0–0.1)
Basophils Relative: 0 %
Eosinophils Absolute: 0.4 10*3/uL (ref 0.0–0.5)
Eosinophils Relative: 3 %
HCT: 45.9 % (ref 39.0–52.0)
Hemoglobin: 14.6 g/dL (ref 13.0–17.0)
Immature Granulocytes: 0 %
Lymphocytes Relative: 19 %
Lymphs Abs: 2.6 10*3/uL (ref 0.7–4.0)
MCH: 29 pg (ref 26.0–34.0)
MCHC: 31.8 g/dL (ref 30.0–36.0)
MCV: 91.1 fL (ref 80.0–100.0)
Monocytes Absolute: 1.4 10*3/uL — ABNORMAL HIGH (ref 0.1–1.0)
Monocytes Relative: 10 %
Neutro Abs: 9.4 10*3/uL — ABNORMAL HIGH (ref 1.7–7.7)
Neutrophils Relative %: 68 %
Platelets: 198 10*3/uL (ref 150–400)
RBC: 5.04 MIL/uL (ref 4.22–5.81)
RDW: 12.3 % (ref 11.5–15.5)
WBC: 13.8 10*3/uL — ABNORMAL HIGH (ref 4.0–10.5)
nRBC: 0 % (ref 0.0–0.2)

## 2021-12-31 LAB — TROPONIN I (HIGH SENSITIVITY)
Troponin I (High Sensitivity): 3 ng/L (ref <18)
Troponin I (High Sensitivity): 3 ng/L (ref <18)

## 2021-12-31 LAB — LIPASE, BLOOD: Lipase: 26 U/L (ref 11–51)

## 2021-12-31 MED ORDER — ONDANSETRON 4 MG PO TBDP: 4.0000 mg | ORAL_TABLET | Freq: Three times a day (TID) | ORAL | 0 refills | Status: DC | PRN

## 2021-12-31 NOTE — ED Provider Notes (Incomplete)
[facility] Provider Note    Event Date/Time   First MD Initiated Contact with Patient 12/30/21 2328     (approximate)   History   Chief Complaint Dizziness, Nausea, Emesis, and Headache   HPI  Martin Ho is a 51 y.o. male with past medical history of psoriasis who presents to the ED complaining of dizziness and nausea.  Patient reports that he had eaten out at [employer] about 2 hours prior to arrival, was then woken from sleep about an hour prior to arrival with sudden onset headache, dizziness, nausea, and vomiting.  He describes the headache as posterior and throbbing, denies any associated vision changes, speech changes, numbness, or weakness.  He has felt nauseous and has vomited multiple times, denies any associated abdominal pain or diarrhea.  He has been feeling very dizzy and lightheaded since the onset of symptoms, but denies any sensation of the room spinning around him.  He denies any family members with similar symptoms.     Physical Exam   Triage Vital Signs: ED Triage Vitals [12/30/21 2325]  Enc Vitals Group     BP 138/67     Pulse Rate 61     Resp 20     Temp 98.4 F (36.9 C)     Temp Source Oral     SpO2 95 %     Weight (!) 305 lb (138.3 kg)     Height 6\' 7"  (2.007 m)     Head Circumference      Peak Flow      Pain Score 5     Pain Loc      Pain Edu?      Excl. in [place]?     Most recent vital signs: Vitals:   12/30/21 2325  BP: 138/67  Pulse: 61  Resp: 20  Temp: 98.4 F (36.9 C)  SpO2: 95%    Constitutional: Alert and oriented. Eyes: Conjunctivae are normal. Head: Atraumatic. Nose: No congestion/rhinnorhea. Mouth/Throat: Mucous membranes are moist.  Cardiovascular: Normal rate, regular rhythm. Grossly normal heart sounds.  2+ radial pulses bilaterally. Respiratory: Normal respiratory effort.  No retractions. Lungs CTAB. Gastrointestinal: Soft and nontender. No distention. Musculoskeletal: No lower extremity  tenderness nor edema.  Neurologic:  Normal speech and language. No gross focal neurologic deficits are appreciated.    ED Results / Procedures / Treatments   Labs (all labs ordered are listed, but only abnormal results are displayed) Labs Reviewed - No data to display   EKG  ED ECG REPORT I, Blake Divine, the attending physician, personally viewed and interpreted this ECG.   Date: 12/30/2021  EKG Time: ***  Rate: ***  Rhythm: {ekg findings:315101}  Axis: ***  Intervals:{conduction defects:17367}  ST&T Change: ***  RADIOLOGY ***  PROCEDURES:  Critical Care performed: {CriticalCareYesNo:19197::"Yes, see critical care procedure note(s)","No"}  Procedures   MEDICATIONS ORDERED IN ED: Medications - No data to display   IMPRESSION / MDM / [place] / ED COURSE  I reviewed the triage vital signs and the nursing notes.                              51 y.o. male with past medical history of hypertension, hyperlipidemia, CAD, and GERD who presents to the ED complaining of sudden onset headache, lightheadedness, nausea, and vomiting after eating at [employer] about 2 hours prior to onset of symptoms.  Patient's presentation is most consistent with {EM COPA:27473}  Differential diagnosis includes, but is not limited to, ***  ***  {**The patient is on the cardiac monitor to evaluate for evidence of arrhythmia and/or significant heart rate changes.**}      FINAL CLINICAL IMPRESSION(S) / ED DIAGNOSES   Final diagnoses:  None     Rx / DC Orders   ED Discharge Orders     None        Note:  This document was prepared using Dragon voice recognition software and may include unintentional dictation errors.

## 2021-12-31 NOTE — ED Notes (Signed)
Family and patient verbalized understanding of discharge instructions and reasons to return to the Ed

## 2021-12-31 NOTE — ED Notes (Signed)
Patient reports he is feeling better.  No further nausea and the dizziness has resolved

## 2022-01-15 DIAGNOSIS — L400 Psoriasis vulgaris: Secondary | ICD-10-CM | POA: Diagnosis not present

## 2022-01-15 DIAGNOSIS — L408 Other psoriasis: Secondary | ICD-10-CM | POA: Diagnosis not present

## 2022-01-26 MED ORDER — ATORVASTATIN CALCIUM 80 MG PO TABS
80 MG | ORAL_TABLET | ORAL | 3 refills | Status: AC
Start: 2022-01-26 — End: 2023-03-01

## 2022-03-17 DIAGNOSIS — B009 Herpesviral infection, unspecified: Secondary | ICD-10-CM | POA: Diagnosis not present

## 2022-03-23 DIAGNOSIS — B029 Zoster without complications: Secondary | ICD-10-CM | POA: Diagnosis not present

## 2022-03-23 DIAGNOSIS — L409 Psoriasis, unspecified: Secondary | ICD-10-CM | POA: Diagnosis not present

## 2022-03-23 DIAGNOSIS — E782 Mixed hyperlipidemia: Secondary | ICD-10-CM | POA: Diagnosis not present

## 2022-04-03 ENCOUNTER — Ambulatory Visit: Payer: Self-pay | Admitting: Nurse Practitioner

## 2022-04-03 VITALS — BP 136/78 | HR 73 | Temp 97.3°F | Ht 79.0 in | Wt 309.0 lb

## 2022-04-03 DIAGNOSIS — B027 Disseminated zoster: Secondary | ICD-10-CM | POA: Diagnosis not present

## 2022-04-03 DIAGNOSIS — B353 Tinea pedis: Secondary | ICD-10-CM | POA: Diagnosis not present

## 2022-04-03 DIAGNOSIS — D899 Disorder involving the immune mechanism, unspecified: Secondary | ICD-10-CM

## 2022-04-03 DIAGNOSIS — I10 Essential (primary) hypertension: Secondary | ICD-10-CM | POA: Diagnosis not present

## 2022-04-03 DIAGNOSIS — E782 Mixed hyperlipidemia: Secondary | ICD-10-CM

## 2022-04-03 DIAGNOSIS — E1100 Type 2 diabetes mellitus with hyperosmolarity without nonketotic hyperglycemic-hyperosmolar coma (NKHHC): Secondary | ICD-10-CM | POA: Diagnosis not present

## 2022-04-03 DIAGNOSIS — E1165 Type 2 diabetes mellitus with hyperglycemia: Secondary | ICD-10-CM | POA: Diagnosis not present

## 2022-04-03 DIAGNOSIS — B029 Zoster without complications: Secondary | ICD-10-CM | POA: Insufficient documentation

## 2022-04-03 DIAGNOSIS — B351 Tinea unguium: Secondary | ICD-10-CM | POA: Insufficient documentation

## 2022-04-03 HISTORY — DX: Zoster without complications: B02.9

## 2022-04-03 NOTE — Addendum Note (Signed)
Addended by: Danelle Berry on: 04/03/2022 10:41 AM   Modules accepted: Orders

## 2022-04-03 NOTE — Progress Notes (Signed)
Patient ID: Martin Ho, Sex: male DOB: June 15, 1970, 52 y.o..   MRN: [id number]   Chief Complaint  Patient presents with   Follow-up    1 week follow up     1 week follow up for shingles   Follow up  Review of Systems  Constitutional: Negative.   HENT: Negative.    Eyes: Negative.   Respiratory: Negative.    Cardiovascular: Negative.   Gastrointestinal: Negative.   Genitourinary: Negative.   Musculoskeletal: Negative.   Skin:        Left foot 4th and fifth toe bed ulceration, no pruritus, stinging sensation x 3 weeks Also left foot 2nd toenail bed discolration and left side first toenail discoloration x 3 months  Neurological: Negative.   Endo/Heme/Allergies: Negative.   Psychiatric/Behavioral: Negative.       Physical Exam Constitutional:      Appearance: Normal appearance.  HENT:     Head: Normocephalic.     Nose: Nose normal.  Eyes:     Pupils: Pupils are equal, round, and reactive to light.  Cardiovascular:     Rate and Rhythm: Normal rate and regular rhythm.     Heart sounds: Normal heart sounds.  Pulmonary:     Breath sounds: Normal breath sounds.  Abdominal:     General: Bowel sounds are normal.     Palpations: Abdomen is soft.  Musculoskeletal:        General: Normal range of motion.     Cervical back: Normal range of motion.  Skin:    Findings: Lesion present.  Neurological:     Mental Status: He is alert and oriented to person, place, and time.  Psychiatric:        Mood and Affect: Mood normal.        Behavior: Behavior normal.       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

## 2022-04-14 DIAGNOSIS — L400 Psoriasis vulgaris: Secondary | ICD-10-CM | POA: Diagnosis not present

## 2022-04-14 DIAGNOSIS — L408 Other psoriasis: Secondary | ICD-10-CM | POA: Diagnosis not present

## 2022-04-14 DIAGNOSIS — D492 Neoplasm of unspecified behavior of bone, soft tissue, and skin: Secondary | ICD-10-CM | POA: Diagnosis not present

## 2022-04-24 ENCOUNTER — Ambulatory Visit: Payer: Self-pay | Admitting: Nurse Practitioner

## 2022-04-24 ENCOUNTER — Encounter: Payer: BLUE CROSS/BLUE SHIELD | Attending: Clinical Nurse Specialist | Primary: Family Medicine

## 2022-05-04 ENCOUNTER — Encounter: Payer: Self-pay | Admitting: Podiatry

## 2022-05-04 ENCOUNTER — Ambulatory Visit: Payer: No Typology Code available for payment source | Admitting: Podiatry

## 2022-05-04 DIAGNOSIS — B351 Tinea unguium: Secondary | ICD-10-CM

## 2022-05-04 NOTE — Progress Notes (Signed)
   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 male presenting today as a new patient for evaluation of discoloration of toenails and itching/burning sensation to the interdigital areas of the feet bilateral that has been ongoing for a few months. He has been using OTC antifungal spray with minimal improvement. Presenting for further treatment and evaluation.  No past medical history on file.  No past surgical history on file.  No Known Allergies   RT foot 05/04/2022  LT foot 05/04/2022  Objective: Physical Exam General: The patient is alert and oriented x3 in no acute distress.  Dermatology: Hyperkeratotic, discolored, thickened, onychodystrophy noted bilateral. Skin is warm, dry and supple bilateral lower extremities. Dermatitis with pruritus noted to the interdigital areas of the bilateral feet. Negative for open lesions or macerations.  Vascular: Palpable pedal pulses bilaterally. No edema or erythema noted. Capillary refill within normal limits.  Neurological: Epicritic and protective threshold grossly intact bilaterally.   Musculoskeletal Exam: No pedal deformity noted  Assessment: #1 Onychomycosis of toenails bilateral #2 Tinea pedis bilateral  Plan of Care:  #1 Patient was evaluated. #2  Today we discussed different treatment options including oral, topical, and laser antifungal treatment modalities.  We discussed their efficacies and side effects.  Patient opts for oral antifungal treatment modality #3 prescription for Lamisil 250 mg #90 daily. Pt denies a history of liver pathology or symptoms. CMP 12/31/2021 WNL.  #4 Continue OTC antifungal spray powder daily #5 Tolcylen  antifungal topical dispensed at checkout #6 Patient may request refill of the lamisil once initial prescription is completed. Will need updated LFT prior to refill #7 return to clinic 6 months   Edrick Kins, DPM [facility]  Dr. Edrick Kins, DPM    [address], [place] [address]                Office [phone]  Fax [phone]

## 2022-05-16 ENCOUNTER — Encounter

## 2022-05-18 MED ORDER — CLOPIDOGREL BISULFATE 75 MG PO TABS
75 MG | ORAL_TABLET | ORAL | 3 refills | Status: DC
Start: 2022-05-18 — End: 2023-04-27

## 2022-05-18 MED ORDER — LISINOPRIL 2.5 MG PO TABS
2.5 MG | ORAL_TABLET | ORAL | 3 refills | Status: DC
Start: 2022-05-18 — End: 2023-04-27

## 2022-06-17 ENCOUNTER — Telehealth: Payer: Self-pay | Admitting: Nurse Practitioner

## 2022-06-17 NOTE — Telephone Encounter (Signed)
Patient left VM asking about if his insurance is out of network with [place]? Says he had this issue but it used to be in network and he has always had the same insurance since he has been a patient here.

## 2022-06-30 DIAGNOSIS — M9903 Segmental and somatic dysfunction of lumbar region: Secondary | ICD-10-CM | POA: Diagnosis not present

## 2022-06-30 DIAGNOSIS — M9905 Segmental and somatic dysfunction of pelvic region: Secondary | ICD-10-CM | POA: Diagnosis not present

## 2022-06-30 DIAGNOSIS — M5416 Radiculopathy, lumbar region: Secondary | ICD-10-CM | POA: Diagnosis not present

## 2022-06-30 DIAGNOSIS — M5417 Radiculopathy, lumbosacral region: Secondary | ICD-10-CM | POA: Diagnosis not present

## 2022-07-01 DIAGNOSIS — M9905 Segmental and somatic dysfunction of pelvic region: Secondary | ICD-10-CM | POA: Diagnosis not present

## 2022-07-01 DIAGNOSIS — M5416 Radiculopathy, lumbar region: Secondary | ICD-10-CM | POA: Diagnosis not present

## 2022-07-01 DIAGNOSIS — M5417 Radiculopathy, lumbosacral region: Secondary | ICD-10-CM | POA: Diagnosis not present

## 2022-07-01 DIAGNOSIS — M9903 Segmental and somatic dysfunction of lumbar region: Secondary | ICD-10-CM | POA: Diagnosis not present

## 2022-09-25 ENCOUNTER — Ambulatory Visit
Admit: 2022-09-25 | Discharge: 2022-09-25 | Payer: BLUE CROSS/BLUE SHIELD | Attending: Cardiovascular Disease | Primary: Family Medicine

## 2022-09-25 DIAGNOSIS — I10 Essential (primary) hypertension: Secondary | ICD-10-CM

## 2022-09-25 MED ORDER — NITROGLYCERIN 0.4 MG SL SUBL
0.4 | ORAL_TABLET | SUBLINGUAL | 3 refills | Status: DC | PRN
Start: 2022-09-25 — End: 2024-01-21

## 2022-09-25 NOTE — Progress Notes (Signed)
[facility]  Cardiology Office Note  [address], [place]  [address]  Phone: [phone]  Fax: [phone]                            Date:  09/25/2022  Patient: Gregory Escobar  Age:  52 y.o., [birth year]    Referral: No ref. provider found      PROBLEM LIST:    Patient Active Problem List    Diagnosis Date Noted    History of myocardial infarction 11/27/2015     Priority: Low    Decreased left ventricular systolic function 11/27/2015     Priority: Low     Overview Note:     05/03/14 2D echo showed EF at 35-40%      Smoker 11/27/2015     Priority: Low    HTN (hypertension)      Priority: Low    Hyperlipemia      Priority: Low    Cardiac LV ejection fraction 30-35%      Priority: Low     Overview Note:     MI 03/13/14 cath revealed EF 30-35 at that time      Coronary artery disease involving native coronary artery 03/13/2014     Priority: Low    Acute MI, anterior wall (HCC) 03/13/2014     Priority: Low     1. Coronary artery disease, prior anteroseptal wall MI 03/13/2014 with occluded mid LAD, PCI with 4.0 x 23 mm Xience, ejection fraction 35-40%, dominant circumflex with branch vessel disease, EF improving to 50-55% by echo 01/17/2021.  2. Active ongoing tobacco use.  3. History of prior EtOH and drug abuse stopped 2010.  4. Family history of premature CAD involving his father side.     PRESENTATION: Gregory Escobar is a 52 y.o. year old male presents for follow-up evaluation.  He has been doing quite well with no complaints of chest pain or shortness of breath.  Still working.  Unfortunately still smoking at least a pack a day.  Has been thinking of quitting but has not been able to do so.  Compliant with his medications and diet and maintaining his weight well.    REVIEW OF SYSTEMS:  Review of Systems   Constitutional:  Negative for activity change, diaphoresis and fatigue.   HENT:  Negative for hearing loss, nosebleeds and tinnitus.    Eyes:  Negative for visual  disturbance.   Respiratory:  Negative for cough, shortness of breath and wheezing.    Cardiovascular:  Negative for chest pain, palpitations and leg swelling.   Gastrointestinal:  Negative for abdominal distention, abdominal pain, blood in stool, diarrhea and vomiting.   Endocrine: Negative for cold intolerance, heat intolerance, polydipsia, polyphagia and polyuria.   Genitourinary:  Negative for difficulty urinating, flank pain and hematuria.   Musculoskeletal:  Negative for arthralgias, back pain, joint swelling and myalgias.   Skin:  Negative for pallor and rash.   Neurological:  Negative for dizziness, seizures, syncope and headaches.   Psychiatric/Behavioral:  Negative for behavioral problems and dysphoric mood. The patient is not nervous/anxious.        Past Medical History:      Diagnosis Date    Acute MI, anterior wall (HCC) 03/13/14    CAD (coronary artery disease) 2016    Cardiac LV ejection fraction 30-35%     MI 03/13/14 cath revealed EF 30-35 at  that time    HTN (hypertension)     Hyperlipemia     Kidney stones 05/2013       Past Surgical History:      Procedure Laterality Date    CARDIAC CATHETERIZATION  2016    Stent to LAD    LITHOTRIPSY  2015    URETER STENT PLACEMENT  2015       Medications:  Current Outpatient Medications   Medication Sig Dispense Refill    nitroGLYCERIN (NITROSTAT) 0.4 MG SL tablet Place 1 tablet under the tongue every 5 minutes as needed for Chest pain (if you have to take 3 then go the ER) 25 tablet 3    lisinopril (PRINIVIL;ZESTRIL) 2.5 MG tablet TAKE 1 TABLET BY MOUTH TWICE  DAILY 180 tablet 3    clopidogrel (PLAVIX) 75 MG tablet TAKE 1 TABLET BY MOUTH ONCE  DAILY 90 tablet 3    atorvastatin (LIPITOR) 80 MG tablet Take 1 tablet by mouth once daily 90 tablet 3    metoprolol tartrate (LOPRESSOR) 25 MG tablet TAKE 1 TABLET BY MOUTH TWICE  DAILY 180 tablet 3    Multiple Vitamins-Minerals (THERAPEUTIC MULTIVITAMIN-MINERALS) tablet Take 1 tablet by mouth daily       No current  facility-administered medications for this visit.       Allergies:  Patient has no known allergies.    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     Comment: recovering addict    Drug use: No     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    Sexual activity: Yes     Partners: Female   Other Topics Concern    Not on file   Social History Narrative    Not on file     Social Determinants of Health     Financial Resource Strain: Not on file   Food Insecurity: Not on file   Transportation Needs: Not on file   Physical Activity: Not on file   Stress: Not on file   Social Connections: Not on file   Intimate Partner Violence: Not on file   Housing Stability: Not on file       Family History:       Problem Relation Age of Onset    Heart Disease Father     High Blood Pressure Father     High Cholesterol Father          Physical Examination:  BP 124/70   Pulse 68   Ht 1.702 m (5\' 7" )   Wt 71.7 kg (158 lb)   BMI 24.75 kg/m   Physical Exam  Constitutional:       Comments: Normal build  Blood pressure right arm sitting 120/60 mmHg, pulse 68 bpm regular  HENT:      Mouth/Throat:      Pharynx: No oropharyngeal exudate.   Eyes:      General: No scleral icterus.        Right eye: No discharge.         Left eye: No discharge.   Neck:      Thyroid: No thyromegaly.      Vascular: No JVD.   Cardiovascular:      Rate and Rhythm: Normal rate and regular rhythm.      Heart sounds: No murmur heard.     No friction rub. No  gallop.      Comments: No JVD  No edema  No murmurs noted  Pulmonary:      Effort: No respiratory distress.      Breath sounds: No stridor. No wheezing or rales.   Abdominal:      General: Bowel sounds are normal. There is no distension.      Palpations: Abdomen is soft. There is no mass.      Tenderness: There is no abdominal tenderness. There is no guarding or rebound.      Comments: No palpable organomegaly   Musculoskeletal:         General: No deformity.   Skin:     General: Skin is warm.      Coloration: Skin is not pale.      Findings: No erythema or rash.   Neurological:      Mental Status: He is alert and oriented to person, place, and time.      Motor: No abnormal muscle tone.      Coordination: Coordination normal.      Deep Tendon Reflexes: Reflexes normal.           Labs:   CBC: No results for input(s): "WBC", "HGB", "HCT", "PLT" in the last 72 hours.  BMP:No results for input(s): "NA", "K", "CO2", "BUN", "CREATININE", "LABGLOM", "GLUCOSE" in the last 72 hours.  BNP: No results for input(s): "BNP" in the last 72 hours.  PT/INR: No results for input(s): "PROTIME", "INR" in the last 72 hours.  APTT:No results for input(s): "APTT" in the last 72 hours.  CARDIAC ENZYMES:No results for input(s): "CKTOTAL", "CKMB", "CKMBINDEX", "TROPONINI" in the last 72 hours.  FASTING LIPID PANEL:  Lab Results   Component Value Date/Time    HDL 40 12/04/2019 09:09 AM    LDLDIRECT 71 03/14/2014 07:04 PM    TRIG 67 12/04/2019 09:09 AM     LIVER PROFILE:No results for input(s): "AST", "ALT", "LABALBU" in the last 72 hours.        Imaging:    Conclusions      Summary   Normal left ventricular size. Left ventricular ejection fraction is   visually estimated at 50-55%. Mild hypokinesis of apex and mid-apical   anteroseptal walls. Normal left ventricular wall thickness. Diastolic   function is indeterminate.   Normal right ventricular size with preserved RV function.   Normal bi-atrial size.   No clinically significant valvular  abnormalities.   Aortic root dimension within normal limits.   No evidence of significant pericardial effusion is noted.   No previous studies.      Signature      ----------------------------------------------------------------   Electronically signed by Loni Muse Simone(Interpreting physician)   on 01/17/2021 12:29 PM   ----------------------------------------------------------------      ASSESSMENT and PLAN:    52 year old gentleman with past medical history of longstanding and ongoing tobacco use, family history of premature  CAD, prior history of EtOH and drug abuse quit 2010, coronary artery disease with acute anteroseptal MI 03/2014 with occluded mid LAD treated with DES x1, ejection fraction 35-40% with dominant circumflex/branch vessel disease, EF improving to 50% by echo 01/17/2021, here for follow-up evaluation.    1.  Doing well now 8 years since his MI with no recurrent events.  Unfortunately still using tobacco.  Have given him advice regarding tobacco cessation.  Will need to continue making efforts in this regard.  Compliant with his medications and maintaining his weight steady.  2.  Advised on diet and regular exercise at moderate activity level.  Can follow-up with nurse practitioner in 6 months.      Orders:  No orders of the defined types were placed in this encounter.    Orders Placed This Encounter   Medications    nitroGLYCERIN (NITROSTAT) 0.4 MG SL tablet     Sig: Place 1 tablet under the tongue every 5 minutes as needed for Chest pain (if you have to take 3 then go the ER)     Dispense:  25 tablet     Refill:  3             Return for NP 6 mths; me 1 year.      Electronically signed by Patrick Jupiter, MD on 09/25/2022 at 12:07 PM    [facility]      Thisdictation was generated by voice recognition computer software.  Although all attempts are made to edit the dictation for accuracy, there may be errors in the transcription that are not intended.

## 2022-10-28 ENCOUNTER — Encounter

## 2022-10-28 DIAGNOSIS — L408 Other psoriasis: Secondary | ICD-10-CM | POA: Diagnosis not present

## 2022-10-28 DIAGNOSIS — L400 Psoriasis vulgaris: Secondary | ICD-10-CM | POA: Diagnosis not present

## 2022-10-29 MED ORDER — METOPROLOL TARTRATE 25 MG PO TABS
25 | ORAL_TABLET | ORAL | 3 refills | Status: DC
Start: 2022-10-29 — End: 2023-10-11

## 2022-11-09 ENCOUNTER — Ambulatory Visit (INDEPENDENT_AMBULATORY_CARE_PROVIDER_SITE_OTHER): Payer: No Typology Code available for payment source | Admitting: Podiatry

## 2022-11-09 DIAGNOSIS — Z91199 Patient's noncompliance with other medical treatment and regimen due to unspecified reason: Secondary | ICD-10-CM

## 2022-11-23 NOTE — Progress Notes (Signed)
   Complete physical exam  Patient: Martin Ho   DOB: 12/20/1996   52 y.o. Male  MRN: [id number]  Subjective:    No chief complaint on file.   Martin Ho is a 52 y.o. male who presents today for a complete physical exam. She reports consuming a {diet types:17450} diet. {types:19826} She generally feels {DESC; WELL/FAIRLY WELL/POORLY:18703}. She reports sleeping {DESC; WELL/FAIRLY WELL/POORLY:18703}. She {does/does not:200015} have additional problems to discuss today.    Most recent fall risk assessment:    08/27/2021   10:42 AM  Fall Risk   Falls in the past year? 0  Number falls in past yr: 0  Injury with Fall? 0  Risk for fall due to : No Fall Risks  Follow up Falls evaluation completed     Most recent depression screenings:    08/27/2021   10:42 AM 07/18/2020   10:46 AM  PHQ 2/9 Scores  PHQ - 2 Score 0 0  PHQ- 9 Score 5     {VISON DENTAL STD PSA (Optional):27386}  {History (Optional):23778}  Patient Care Team: Jessup, Joy, NP as PCP - General (Nurse Practitioner)   Outpatient Medications Prior to Visit  Medication Sig   fluticasone (FLONASE) 50 MCG/ACT nasal spray Place 2 sprays into both nostrils in the morning and at bedtime. After 7 days, reduce to once daily.   norgestimate-ethinyl estradiol (SPRINTEC 28) 0.25-35 MG-MCG tablet Take 1 tablet by mouth daily.   Nystatin POWD Apply liberally to affected area 2 times per day   spironolactone (ALDACTONE) 100 MG tablet Take 1 tablet (100 mg total) by mouth daily.   No facility-administered medications prior to visit.    ROS        Objective:     There were no vitals taken for this visit. {Vitals History (Optional):23777}  Physical Exam   No results found for any visits on 10/02/21. {Show previous labs (optional):23779}    Assessment & Plan:    Routine Health Maintenance and Physical Exam  Immunization History  Administered Date(s) Administered   DTaP 03/05/1999, 05/01/1999,  07/10/1999, 03/25/2000, 10/09/2003   Hepatitis A 08/05/2007, 08/10/2008   Hepatitis B 12/21/1998, 01/28/1999, 07/10/1999   HiB (PRP-OMP) 03/05/1999, 05/01/1999, 07/10/1999, 03/25/2000   IPV 03/05/1999, 05/01/1999, 12/29/1999, 10/09/2003   Influenza,inj,Quad PF,6+ Mos 11/10/2013   Influenza-Unspecified 02/10/2012   MMR 12/28/2000, 10/09/2003   Meningococcal Polysaccharide 08/10/2011   Pneumococcal Conjugate-13 03/25/2000   Pneumococcal-Unspecified 07/10/1999, 09/23/1999   Tdap 08/10/2011   Varicella 12/29/1999, 08/05/2007    Health Maintenance  Topic Date Due   HIV Screening  Never done   Hepatitis C Screening  Never done   INFLUENZA VACCINE  09/30/2021   PAP-Cervical Cytology Screening  10/02/2021 (Originally 12/20/2019)   PAP SMEAR-Modifier  10/02/2021 (Originally 12/20/2019)   TETANUS/TDAP  10/02/2021 (Originally 08/09/2021)   HPV VACCINES  Discontinued   COVID-19 Vaccine  Discontinued    Discussed health benefits of physical activity, and encouraged her to engage in regular exercise appropriate for her age and condition.  Problem List Items Addressed This Visit   None Visit Diagnoses     Annual physical exam    -  Primary   Cervical cancer screening       Need for Tdap vaccination          No follow-ups on file.     Joy Jessup, NP   

## 2022-11-25 IMAGING — CR DG CHEST 2V
2 series · 2 of 2 positions shown · non-contrast
Comparison: None.

CLINICAL DATA: Shortness of breath

EXAM:
CHEST - 2 VIEW

[chest pa]
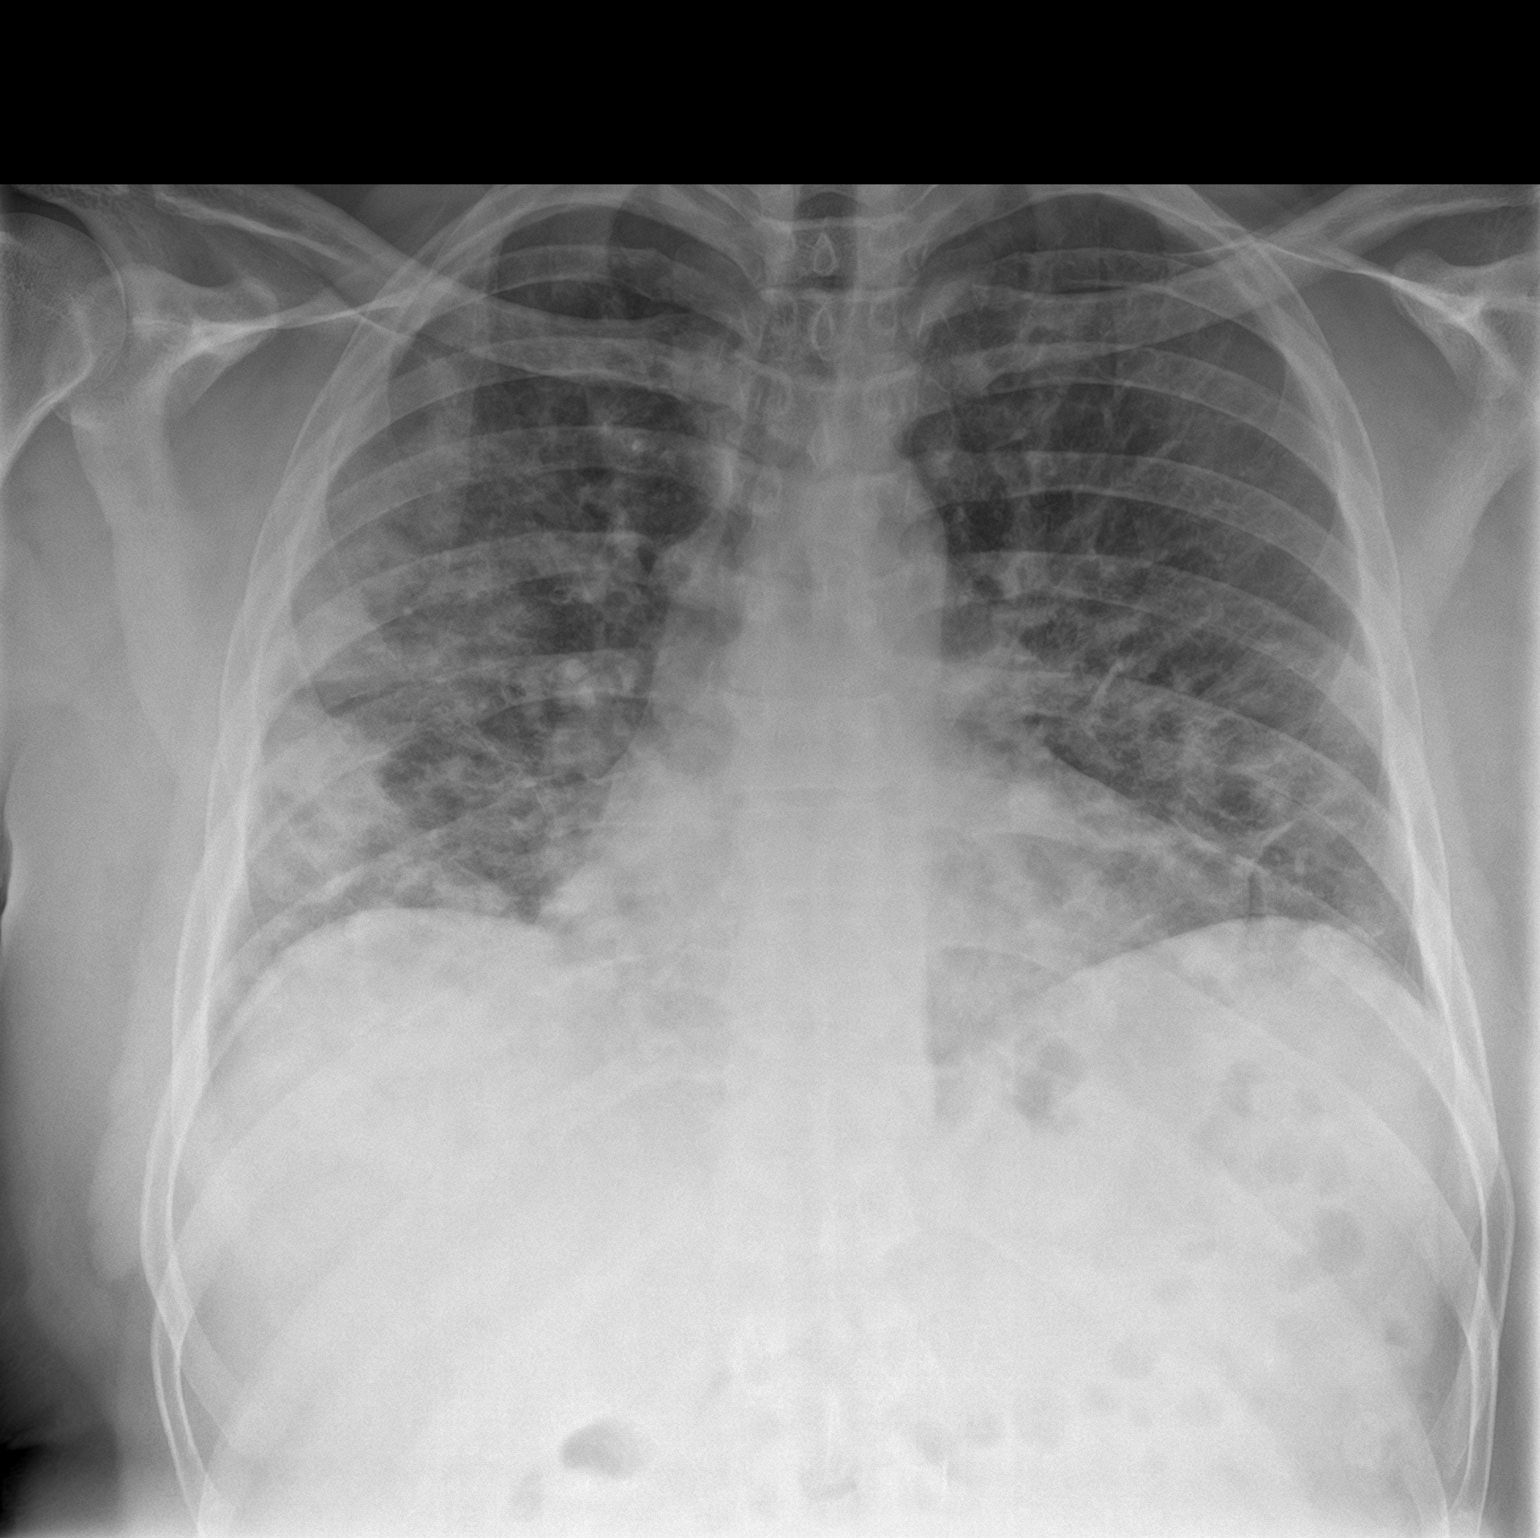

[chest lat]
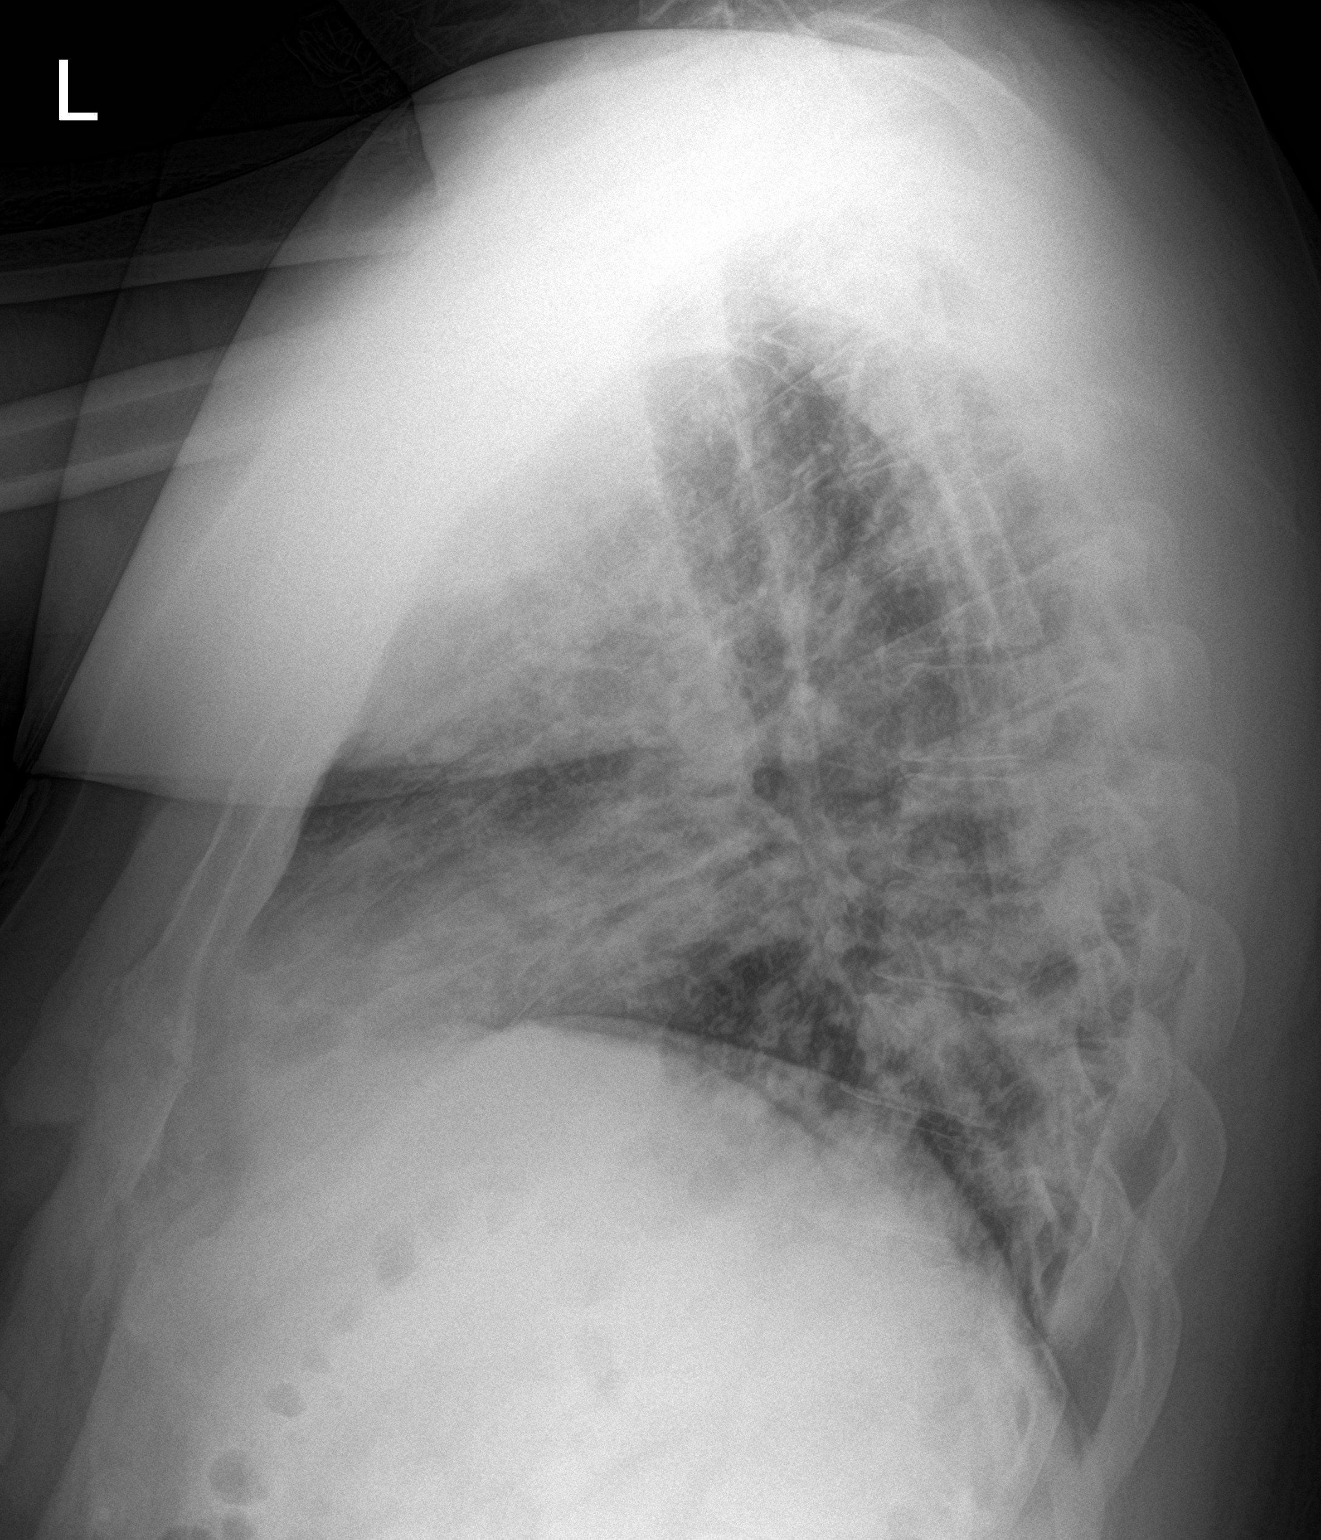

[2 of 2 positions shown; findings below may reference images not displayed]

FINDINGS: Heart size and mediastinal contours are within normal limits.
Diffuse bilateral airspace opacities, most confluent at the RIGHT
lung base. No pleural effusion or pneumothorax is seen. Osseous
structures about the chest are unremarkable.
IMPRESSION: Diffuse bilateral airspace opacities, most likely multifocal
pneumonia.

## 2023-01-29 DIAGNOSIS — E782 Mixed hyperlipidemia: Secondary | ICD-10-CM | POA: Diagnosis not present

## 2023-01-29 DIAGNOSIS — E1100 Type 2 diabetes mellitus with hyperosmolarity without nonketotic hyperglycemic-hyperosmolar coma (NKHHC): Secondary | ICD-10-CM | POA: Diagnosis not present

## 2023-01-29 DIAGNOSIS — E1165 Type 2 diabetes mellitus with hyperglycemia: Secondary | ICD-10-CM | POA: Diagnosis not present

## 2023-01-29 DIAGNOSIS — L400 Psoriasis vulgaris: Secondary | ICD-10-CM | POA: Diagnosis not present

## 2023-01-29 DIAGNOSIS — I10 Essential (primary) hypertension: Secondary | ICD-10-CM | POA: Diagnosis not present

## 2023-01-30 LAB — LIPID PANEL
Chol/HDL Ratio: 3.9 {ratio} (ref 0.0–5.0)
Cholesterol, Total: 181 mg/dL (ref 100–199)
HDL: 47 mg/dL (ref >39)
LDL Chol Calc (NIH): 120 mg/dL — ABNORMAL HIGH (ref 0–99)
Triglycerides: 75 mg/dL (ref 0–149)
VLDL Cholesterol Cal: 14 mg/dL (ref 5–40)

## 2023-01-30 LAB — CMP14+EGFR
ALT: 30 [IU]/L (ref 0–44)
AST: 33 [IU]/L (ref 0–40)
Albumin: 4.2 g/dL (ref 4.1–5.1)
Alkaline Phosphatase: 70 [IU]/L (ref 44–121)
BUN/Creatinine Ratio: 15 (ref 9–20)
BUN: 16 mg/dL (ref 6–24)
Bilirubin Total: 0.5 mg/dL (ref 0.0–1.2)
CO2: 23 mmol/L (ref 20–29)
Calcium: 9.6 mg/dL (ref 8.7–10.2)
Chloride: 102 mmol/L (ref 96–106)
Creatinine, Ser: 1.07 mg/dL (ref 0.76–1.27)
Globulin, Total: 2.6 g/dL (ref 1.5–4.5)
Glucose: 94 mg/dL (ref 70–99)
Potassium: 4.3 mmol/L (ref 3.5–5.2)
Sodium: 139 mmol/L (ref 134–144)
Total Protein: 6.8 g/dL (ref 6.0–8.5)
eGFR: 85 mL/min/{1.73_m2} (ref >59)

## 2023-01-30 LAB — TSH: TSH: 1.31 u[IU]/mL (ref 0.450–4.500)

## 2023-01-30 LAB — HEMOGLOBIN A1C
Est. average glucose Bld gHb Est-mCnc: 117 mg/dL
Hgb A1c MFr Bld: 5.7 % — ABNORMAL HIGH (ref 4.8–5.6)

## 2023-02-02 ENCOUNTER — Other Ambulatory Visit: Payer: Self-pay | Admitting: Cardiology

## 2023-02-02 DIAGNOSIS — I10 Essential (primary) hypertension: Secondary | ICD-10-CM

## 2023-02-05 ENCOUNTER — Encounter: Payer: No Typology Code available for payment source | Admitting: Cardiology

## 2023-02-09 ENCOUNTER — Encounter: Payer: Self-pay | Admitting: Cardiology

## 2023-02-09 ENCOUNTER — Ambulatory Visit (INDEPENDENT_AMBULATORY_CARE_PROVIDER_SITE_OTHER): Payer: No Typology Code available for payment source | Admitting: Cardiology

## 2023-02-09 VITALS — BP 134/76 | HR 77 | Ht 79.0 in | Wt 316.0 lb

## 2023-02-09 DIAGNOSIS — E782 Mixed hyperlipidemia: Secondary | ICD-10-CM | POA: Diagnosis not present

## 2023-02-09 DIAGNOSIS — I10 Essential (primary) hypertension: Secondary | ICD-10-CM | POA: Diagnosis not present

## 2023-02-09 DIAGNOSIS — Z0000 Encounter for general adult medical examination without abnormal findings: Secondary | ICD-10-CM | POA: Diagnosis not present

## 2023-02-09 DIAGNOSIS — Z0130 Encounter for examination of blood pressure without abnormal findings: Secondary | ICD-10-CM

## 2023-02-09 DIAGNOSIS — Z1211 Encounter for screening for malignant neoplasm of colon: Secondary | ICD-10-CM | POA: Diagnosis not present

## 2023-02-09 MED ORDER — ROSUVASTATIN CALCIUM 20 MG PO TABS: 20.0000 mg | ORAL_TABLET | Freq: Every day | ORAL | 11 refills | Status: DC

## 2023-02-09 MED ORDER — CYCLOBENZAPRINE HCL 5 MG PO TABS: 5.0000 mg | ORAL_TABLET | Freq: Three times a day (TID) | ORAL | 0 refills | Status: AC | PRN

## 2023-02-09 NOTE — Progress Notes (Addendum)
Complete physical exam  Patient: Martin Ho   DOB: 17-Aug-1970   52 y.o. Male  MRN: [id number]  Subjective:    Chief Complaint  Patient presents with   Follow-up    Pt had covid a few years back and wants to make sure he is doing to proper lunch screening  Pt is turning 52 and would like to do the proper testing for what runs in his family New aces and pains that is starting after daily chores, possible supplement     Martin Ho is a 52 y.o. male who presents today for a complete physical exam. He reports consuming a low fat and low sodium diet. Exercise is limited by orthopedic condition(s): shoulder, back injury. He generally feels well. He reports sleeping well. He does have additional problems to discuss today. Complains of occasional lower back pain, spasms, uses a massage gun and ibuprofen. Will send in a muscle relaxer to use as needed.    Most recent fall risk assessment:     No data to display           Most recent depression screenings:     No data to display          Vision:Within last year and Dental: No current dental problems and Receives regular dental care  History reviewed. No pertinent past medical history.  History reviewed. No pertinent surgical history.  History reviewed. No pertinent family history.  Social History   Socioeconomic History   Marital status: Single    Spouse name: Not on file   Number of children: Not on file   Years of education: Not on file   Highest education level: Not on file  Occupational History   Not on file  Tobacco Use   Smoking status: Never   Smokeless tobacco: Never  Substance and Sexual Activity   Alcohol use: Not Currently   Drug use: Not Currently   Sexual activity: Not on file  Other Topics Concern   Not on file  Social History Narrative   Not on file   Social Determinants of Health   Financial Resource Strain: Not on file  Food Insecurity: Not on file  Transportation Needs: Not on file   Physical Activity: Not on file  Stress: Not on file  Social Connections: Not on file  Intimate Partner Violence: Not on file    Outpatient Medications Prior to Visit  Medication Sig   cetirizine (ZYRTEC) 10 MG chewable tablet Chew 10 mg by mouth daily.   clobetasol (TEMOVATE) 0.05 % external solution Apply 1 Application topically 2 (two) times daily.   Tapinarof (VTAMA) 1 % CREA Apply topically.   ustekinumab (STELARA) 90 MG/ML SOSY injection Inject 90 mg into the skin.   [DISCONTINUED] rosuvastatin (CRESTOR) 10 MG tablet TAKE 1 TABLET BY MOUTH NIGHTLY AT BEDTIME FOR HIGH CHOLESTEROL   gabapentin (NEURONTIN) 100 MG capsule Take 100 mg by mouth daily. (Patient not taking: Reported on 02/09/2023)   No facility-administered medications prior to visit.    Review of Systems  Constitutional: Negative.   HENT: Negative.    Eyes: Negative.   Respiratory: Negative.  Negative for shortness of breath.   Cardiovascular: Negative.  Negative for chest pain.  Gastrointestinal: Negative.  Negative for abdominal pain, constipation and diarrhea.  Genitourinary: Negative.   Musculoskeletal:  Negative for joint pain and myalgias.  Skin: Negative.   Neurological: Negative.  Negative for dizziness and headaches.  Endo/Heme/Allergies: Negative.   All other systems reviewed and are  negative.       Objective:     BP 134/76   Pulse 77   Ht 6\' 7"  (2.007 m)   Wt (!) 316 lb (143.3 kg)   SpO2 98%   BMI 35.60 kg/m  BP Readings from Last 3 Encounters:  02/09/23 134/76  04/03/22 136/78  12/31/21 115/68      Physical Exam Nursing note reviewed.  Constitutional:      Appearance: Normal appearance. He is normal weight.  HENT:     Head: Normocephalic and atraumatic.     Nose: Nose normal.     Mouth/Throat:     Mouth: Mucous membranes are moist.     Pharynx: Oropharynx is clear.  Eyes:     Extraocular Movements: Extraocular movements intact.     Conjunctiva/sclera: Conjunctivae normal.      Pupils: Pupils are equal, round, and reactive to light.  Cardiovascular:     Rate and Rhythm: Normal rate and regular rhythm.     Pulses: Normal pulses.     Heart sounds: Normal heart sounds.  Pulmonary:     Effort: Pulmonary effort is normal.     Breath sounds: Normal breath sounds.  Abdominal:     General: Abdomen is flat. Bowel sounds are normal.     Palpations: Abdomen is soft.  Musculoskeletal:        General: Normal range of motion.     Cervical back: Normal range of motion.  Skin:    General: Skin is warm and dry.  Neurological:     General: No focal deficit present.     Mental Status: He is alert and oriented to person, place, and time.  Psychiatric:        Mood and Affect: Mood normal.        Behavior: Behavior normal.        Thought Content: Thought content normal.        Judgment: Judgment normal.      No results found for any visits on 02/09/23.  Recent Results (from the past 2160 hour(s))  Hemoglobin A1c     Status: Abnormal   Collection Time: 01/29/23  9:03 AM  Result Value Ref Range   Hgb A1c MFr Bld 5.7 (H) 4.8 - 5.6 %    Comment:          Prediabetes: 5.7 - 6.4          Diabetes: >6.4          Glycemic control for adults with diabetes: <7.0    Est. average glucose Bld gHb Est-mCnc 117 mg/dL  TSH     Status: None   Collection Time: 01/29/23  9:03 AM  Result Value Ref Range   TSH 1.310 0.450 - 4.500 uIU/mL  CMP14+EGFR     Status: None   Collection Time: 01/29/23  9:03 AM  Result Value Ref Range   Glucose 94 70 - 99 mg/dL   BUN 16 6 - 24 mg/dL   Creatinine, Ser 7.84 0.76 - 1.27 mg/dL   eGFR 85 >69 GE/XBM/8.41   BUN/Creatinine Ratio 15 9 - 20   Sodium 139 134 - 144 mmol/L   Potassium 4.3 3.5 - 5.2 mmol/L   Chloride 102 96 - 106 mmol/L   CO2 23 20 - 29 mmol/L   Calcium 9.6 8.7 - 10.2 mg/dL   Total Protein 6.8 6.0 - 8.5 g/dL   Albumin 4.2 4.1 - 5.1 g/dL   Globulin, Total 2.6 1.5 - 4.5 g/dL  Bilirubin Total 0.5 0.0 - 1.2 mg/dL   Alkaline  Phosphatase 70 44 - 121 IU/L   AST 33 0 - 40 IU/L   ALT 30 0 - 44 IU/L  Lipid panel     Status: Abnormal   Collection Time: 01/29/23  9:03 AM  Result Value Ref Range   Cholesterol, Total 181 100 - 199 mg/dL   Triglycerides 75 0 - 149 mg/dL   HDL 47 >16 mg/dL   VLDL Cholesterol Cal 14 5 - 40 mg/dL   LDL Chol Calc (NIH) 109 (H) 0 - 99 mg/dL   Chol/HDL Ratio 3.9 0.0 - 5.0 ratio    Comment:                                   T. Chol/HDL Ratio                                             Men  Women                               1/2 Avg.Risk  3.4    3.3                                   Avg.Risk  5.0    4.4                                2X Avg.Risk  9.6    7.1                                3X Avg.Risk 23.4   11.0         Assessment & Plan:    Routine Health Maintenance and Physical Exam   There is no immunization history on file for this patient.  Health Maintenance  Topic Date Due   Hepatitis C Screening  Never done   DTaP/Tdap/Td (1 - Tdap) Never done   Colonoscopy  Never done   INFLUENZA VACCINE  Never done   COVID-19 Vaccine (1 - 2023-24 season) Never done   HIV Screening  Completed   HPV VACCINES  Aged Out    Discussed health benefits of physical activity, and encouraged him to engage in regular exercise appropriate for his age and condition.  Problem List Items Addressed This Visit       Other   Mixed hyperlipidemia   Relevant Medications   rosuvastatin (CRESTOR) 20 MG tablet   Other Visit Diagnoses     Colon cancer screening    -  Primary   Relevant Orders   Ambulatory referral to Gastroenterology      Return in about 1 year (around 02/09/2024).     Marisue Ivan, NP  02/09/2023   This document may have been prepared by Dragon Voice Recognition software and as such may include unintentional dictation errors.

## 2023-02-10 LAB — CBC WITH DIFF/PLATELET
Basophils Absolute: 0 10*3/uL (ref 0.0–0.2)
Basos: 0 %
EOS (ABSOLUTE): 0.5 10*3/uL — ABNORMAL HIGH (ref 0.0–0.4)
Eos: 7 %
Hematocrit: 49 % (ref 37.5–51.0)
Hemoglobin: 16.1 g/dL (ref 13.0–17.7)
Immature Grans (Abs): 0 10*3/uL (ref 0.0–0.1)
Immature Granulocytes: 0 %
Lymphocytes Absolute: 2.3 10*3/uL (ref 0.7–3.1)
Lymphs: 33 %
MCH: 29.5 pg (ref 26.6–33.0)
MCHC: 32.9 g/dL (ref 31.5–35.7)
MCV: 90 fL (ref 79–97)
Monocytes Absolute: 0.7 10*3/uL (ref 0.1–0.9)
Monocytes: 10 %
Neutrophils Absolute: 3.4 10*3/uL (ref 1.4–7.0)
Neutrophils: 50 %
Platelets: 243 10*3/uL (ref 150–450)
RBC: 5.46 x10E6/uL (ref 4.14–5.80)
RDW: 11.8 % (ref 11.6–15.4)
WBC: 6.9 10*3/uL (ref 3.4–10.8)

## 2023-02-16 ENCOUNTER — Other Ambulatory Visit: Payer: Self-pay

## 2023-02-16 DIAGNOSIS — Z139 Encounter for screening, unspecified: Secondary | ICD-10-CM

## 2023-02-18 DIAGNOSIS — Z139 Encounter for screening, unspecified: Secondary | ICD-10-CM | POA: Diagnosis not present

## 2023-02-19 LAB — C-REACTIVE PROTEIN: CRP: <1 mg/L (ref 0–10)

## 2023-03-01 MED ORDER — ATORVASTATIN CALCIUM 80 MG PO TABS
80 MG | ORAL_TABLET | ORAL | 0 refills | Status: DC
Start: 2023-03-01 — End: 2023-03-09

## 2023-03-09 IMAGING — CT CT CHEST HIGH RESOLUTION W/O CM
2 of 5 series · 14 of 36 positions shown, 17 images · non-contrast
Comparison: 03/30/2020.

CLINICAL DATA: Pulmonary fibrosis. Diagnosed with COVID in March 2020 with pneumonia in [REDACTED].

EXAM:
CT CHEST WITHOUT CONTRAST
TECHNIQUE: Multidetector CT imaging of the chest was performed following the
standard protocol without intravenous contrast. High resolution
imaging of the lungs, as well as inspiratory and expiratory imaging,
was performed.

[Series 2: thorax · axial · 0.71mm/px · z∈[-222,-22]mm · 11 of 117 slices shown, 14 images]
[im 11/117  mediastinal]
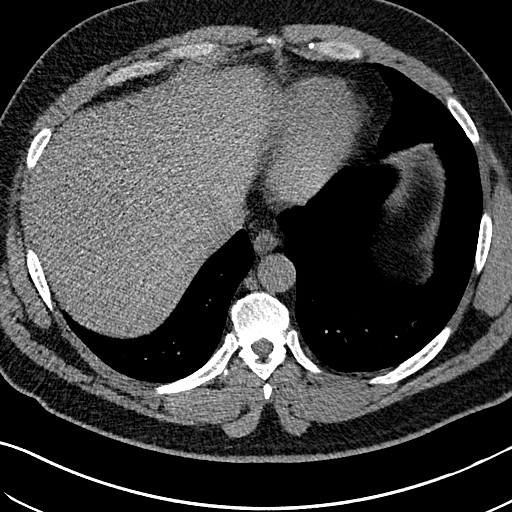
[im 11/117  lung]
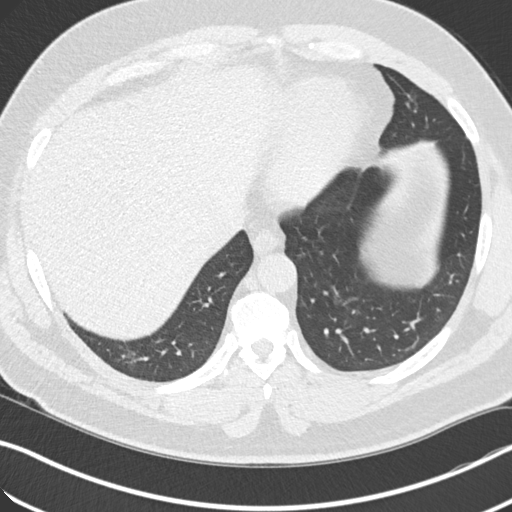
[im 21/117  lung]
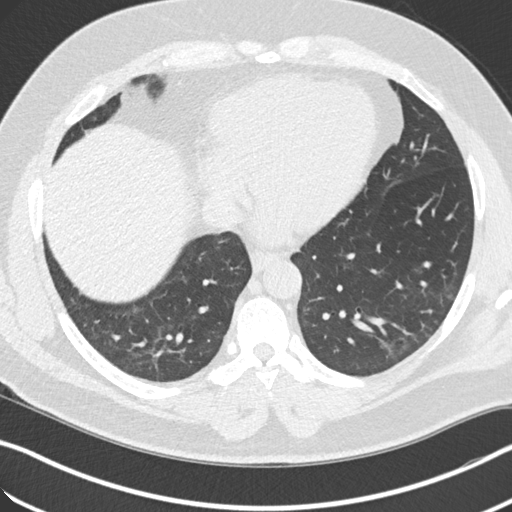
[im 31/117  lung]
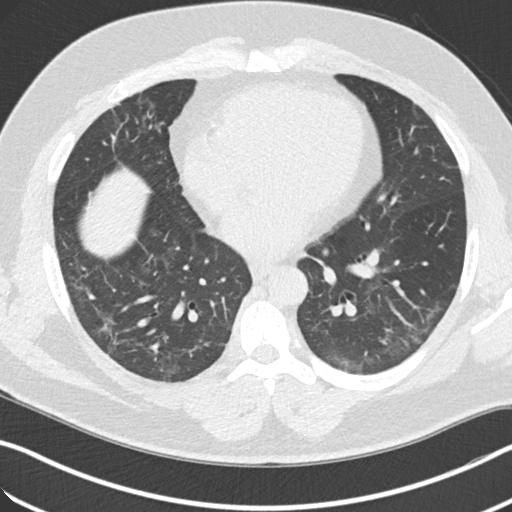
[im 41/117  lung]
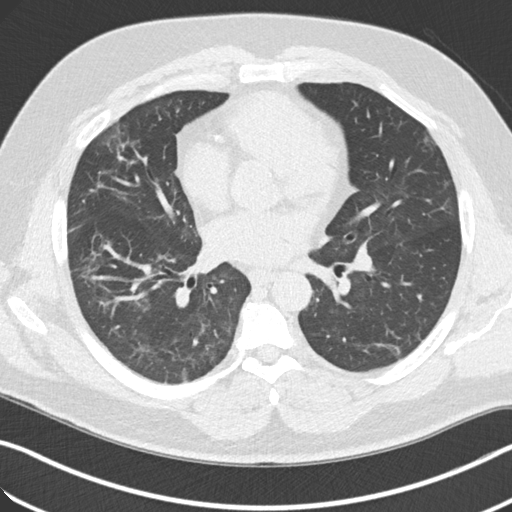
[im 51/117  mediastinal]
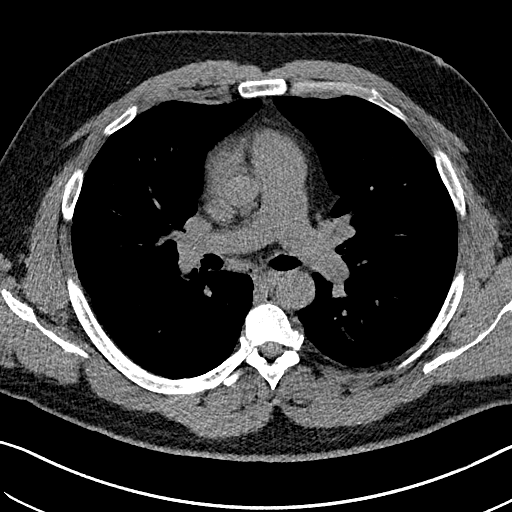
[im 51/117  lung]
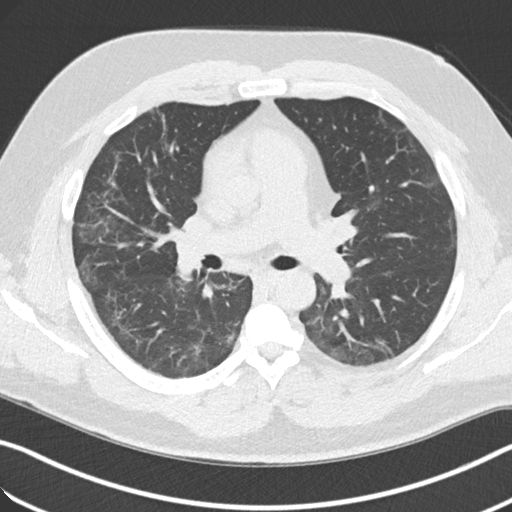
[im 61/117  lung]
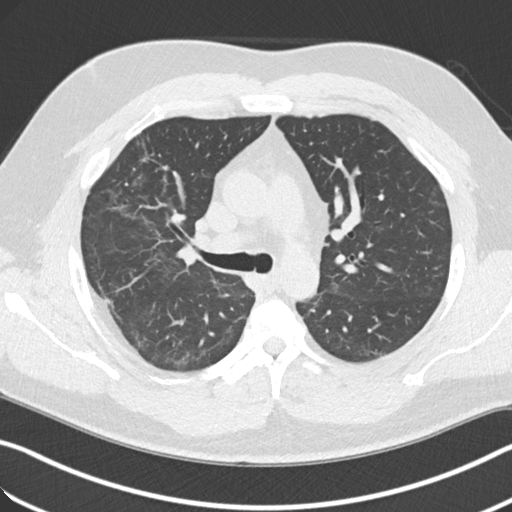
[im 71/117  lung]
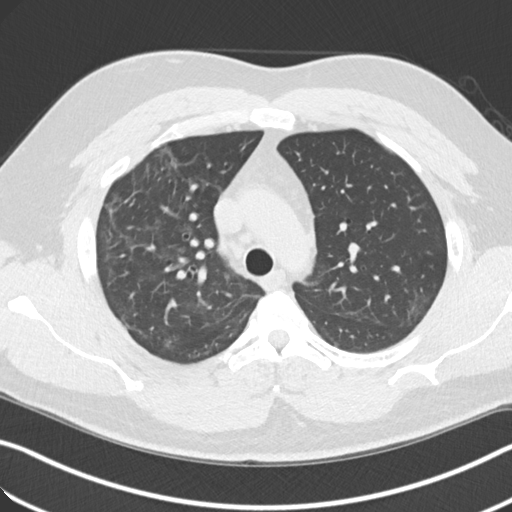
[im 81/117  lung]
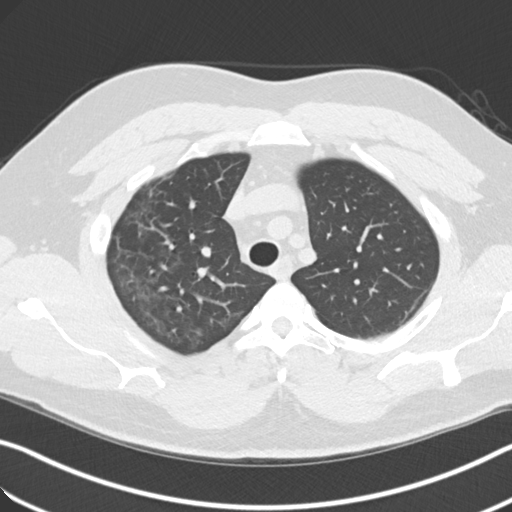
[im 91/117  mediastinal]
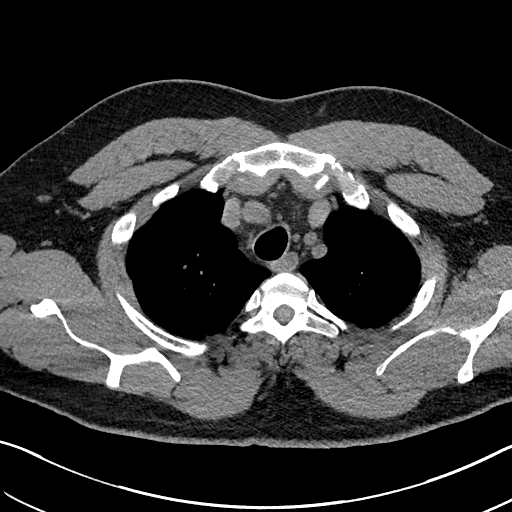
[im 91/117  lung]
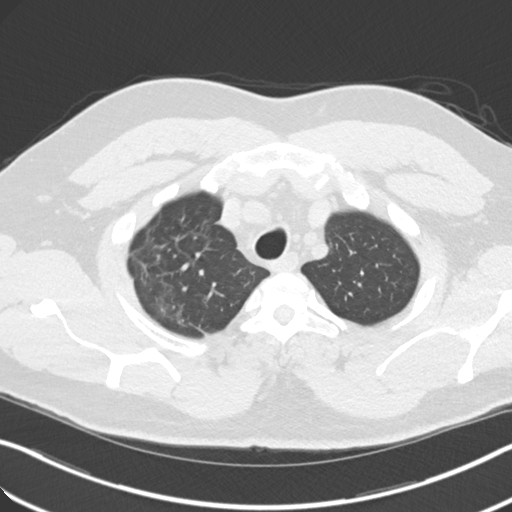
[im 101/117  lung]
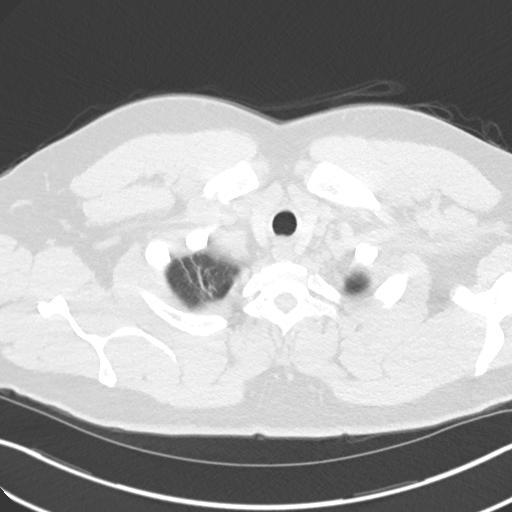
[im 111/117  lung]
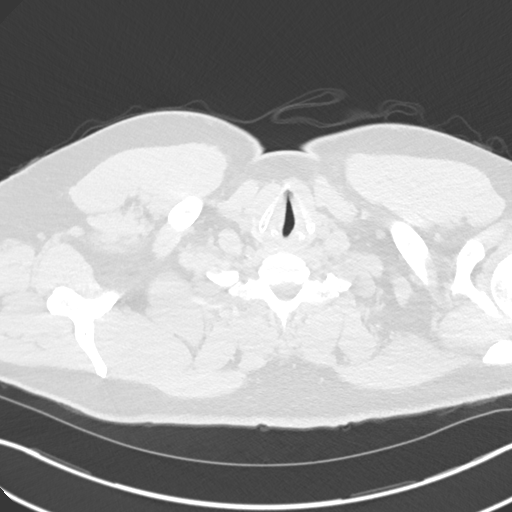

[Series 7: coronal · coronal · 0.54mm/px · 3 of 109 slices shown]
[im 22/109  lung]
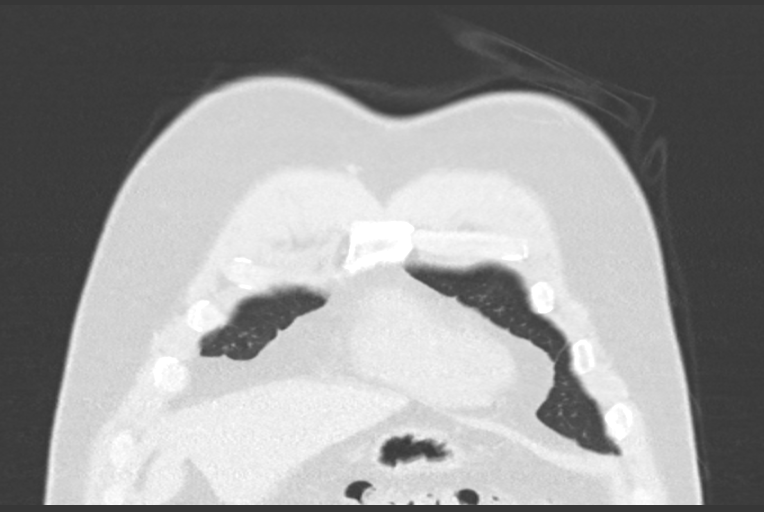
[im 44/109  lung]
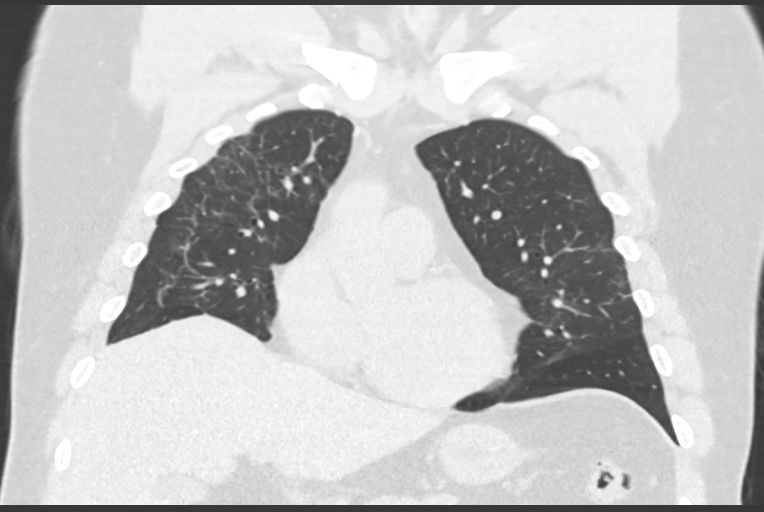
[im 65/109  lung]
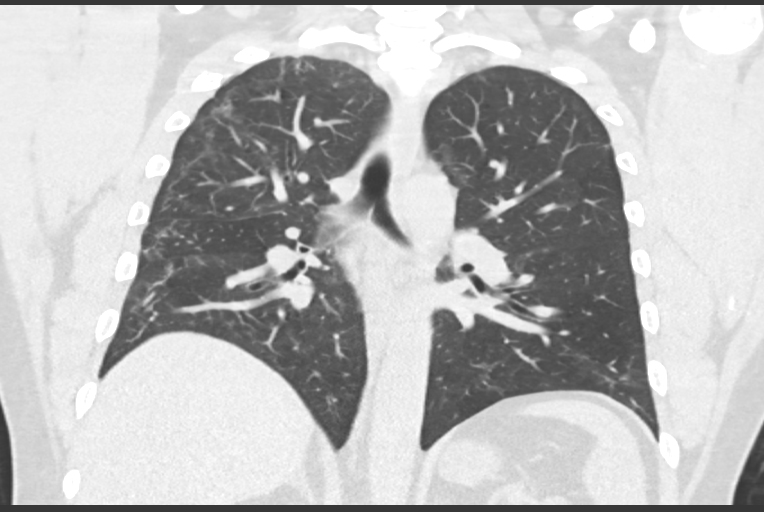

[14 of 36 positions shown; findings below may reference images not displayed]

FINDINGS: Cardiovascular: Atherosclerotic calcification of the aorta and
coronary arteries. Pulmonic trunk is enlarged. Heart is at the upper
limits of normal in size. No pericardial effusion.

Mediastinum/Nodes: No pathologically enlarged mediastinal or
axillary lymph nodes. Hilar regions are difficult to definitively
evaluate without IV contrast. Esophagus is grossly unremarkable.

Lungs/Pleura: Mild bronchiectasis and residual peripheral pulmonary
parenchymal ground-glass with architectural distortion. A few
scattered pulmonary nodules measure up to 4 mm in the lingula
(3/91), unchanged. No pleural fluid. Airway is unremarkable. There
is air trapping.

Upper Abdomen: Visualized portions of the liver, spleen and stomach
are unremarkable.

Musculoskeletal: Degenerative changes in the spine. No worrisome
lytic or sclerotic lesions.
IMPRESSION: 1. Pulmonary parenchymal sequelae of [id number]-related acute
respiratory distress syndrome. Findings are suggestive of an
alternative diagnosis (not UIP) per consensus guidelines: Diagnosis
of Idiopathic Pulmonary Fibrosis: An Official ATS/ERS/JRS/ALAT
Clinical Practice Guideline. Am J Respir Crit Care Med Vol 198, [place]
5, [id number][DATE].
2. Aortic atherosclerosis (84DHF-NVD.D). Coronary artery
calcification.
3. Enlarged pulmonic trunk, indicative of pulmonary arterial
hypertension.

## 2023-03-10 MED ORDER — ATORVASTATIN CALCIUM 80 MG PO TABS
80 MG | ORAL_TABLET | ORAL | 0 refills | Status: DC
Start: 2023-03-10 — End: 2023-04-23

## 2023-03-10 NOTE — Telephone Encounter (Signed)
Message from patient: I am down to last 2 tablets and have a upcoming appointment on 04/02/2023 to get order for blood work for yearly prescription I was just wondering if I could get a refill until blood work is completed

## 2023-04-02 ENCOUNTER — Encounter: Payer: BLUE CROSS/BLUE SHIELD | Attending: Clinical Nurse Specialist | Primary: Family Medicine

## 2023-04-09 ENCOUNTER — Ambulatory Visit: Payer: No Typology Code available for payment source | Admitting: Cardiology

## 2023-04-09 ENCOUNTER — Encounter: Payer: Self-pay | Admitting: Cardiology

## 2023-04-09 VITALS — BP 141/89 | HR 80 | Ht 79.0 in | Wt 310.0 lb

## 2023-04-09 DIAGNOSIS — R7303 Prediabetes: Secondary | ICD-10-CM

## 2023-04-09 DIAGNOSIS — I10 Essential (primary) hypertension: Secondary | ICD-10-CM

## 2023-04-09 DIAGNOSIS — E66811 Obesity, class 1: Secondary | ICD-10-CM | POA: Insufficient documentation

## 2023-04-09 DIAGNOSIS — M5431 Sciatica, right side: Secondary | ICD-10-CM | POA: Insufficient documentation

## 2023-04-09 MED ORDER — PANTOPRAZOLE SODIUM 20 MG PO TBEC: 20.0000 mg | DELAYED_RELEASE_TABLET | Freq: Every day | ORAL | 1 refills | Status: DC

## 2023-04-09 NOTE — Progress Notes (Signed)
 Established Patient Office Visit  Subjective:  Patient ID: Martin Ho, male    DOB: 07-26-1970  Age: 53 y.o. MRN: [id number]  No chief complaint on file.   Patient in office needing a form completed to get exercise equipment with his medical FSA. Patient is obese, has HLD and is prediabetic. Patient experienced a back injury while in college, he now experiences right sided sciatica pain. Patient states he exercises better on a recumbent bicycle. Form completed to get exercise equipment to help him lose weight and reverse his HLD and prediabetes.     No other concerns at this time.   History reviewed. No pertinent past medical history.  History reviewed. No pertinent surgical history.  Social History   Socioeconomic History   Marital status: Single    Spouse name: Not on file   Number of children: Not on file   Years of education: Not on file   Highest education level: Not on file  Occupational History   Not on file  Tobacco Use   Smoking status: Never   Smokeless tobacco: Never  Substance and Sexual Activity   Alcohol use: Not Currently   Drug use: Not Currently   Sexual activity: Not on file  Other Topics Concern   Not on file  Social History Narrative   Not on file   Social Drivers of Health   Financial Resource Strain: Low Risk  (03/08/2023)   Received from [facility]   Overall Financial Resource Strain (CARDIA)    Difficulty of Paying Living Expenses: Not very hard  Food Insecurity: No Food Insecurity (03/08/2023)   Received from [facility]   Hunger Vital Sign    Worried About Running Out of Food in the Last Year: Never true    Ran Out of Food in the Last Year: Never true  Transportation Needs: No Transportation Needs (03/08/2023)   Received from [facility] - Transportation    In the past 12 months, has lack of transportation kept you from medical appointments or from getting medications?: No     Lack of Transportation (Non-Medical): No  Physical Activity: Not on file  Stress: Not on file  Social Connections: Not on file  Intimate Partner Violence: Not on file    History reviewed. No pertinent family history.  No Known Allergies  Outpatient Medications Prior to Visit  Medication Sig   cetirizine (ZYRTEC) 10 MG chewable tablet Chew 10 mg by mouth daily.   clobetasol (TEMOVATE) 0.05 % external solution Apply 1 Application topically 2 (two) times daily.   cyclobenzaprine  (FLEXERIL ) 5 MG tablet Take 1 tablet (5 mg total) by mouth 3 (three) times daily as needed for muscle spasms.   doxycycline (VIBRA-TABS) 100 MG tablet Take 100 mg by mouth 2 (two) times daily.   fluocinonide ointment (LIDEX) 0.05 % Apply 1 Application topically 2 (two) times daily.   rosuvastatin  (CRESTOR ) 20 MG tablet Take 1 tablet (20 mg total) by mouth daily.   Tapinarof (VTAMA) 1 % CREA Apply topically.   ustekinumab  (STELARA ) 90 MG/ML SOSY injection Inject 90 mg into the skin.   gabapentin (NEURONTIN) 100 MG capsule Take 100 mg by mouth daily. (Patient not taking: Reported on 04/09/2023)   No facility-administered medications prior to visit.    Review of Systems  Constitutional: Negative.   HENT: Negative.    Eyes: Negative.   Respiratory: Negative.  Negative for shortness of breath.   Cardiovascular: Negative.  Negative  for chest pain.  Gastrointestinal: Negative.  Negative for abdominal pain, constipation and diarrhea.  Genitourinary: Negative.   Musculoskeletal:  Positive for back pain. Negative for joint pain and myalgias.  Skin: Negative.   Neurological: Negative.  Negative for dizziness and headaches.  Endo/Heme/Allergies: Negative.   All other systems reviewed and are negative.      Objective:   BP (!) 141/89   Pulse 80   Ht 6' 7 (2.007 m)   Wt (!) 310 lb (140.6 kg)   SpO2 97%   BMI 34.92 kg/m   Vitals:   04/09/23 0904  BP: (!) 141/89  Pulse: 80  Height: 6' 7 (2.007 m)   Weight: (!) 310 lb (140.6 kg)  SpO2: 97%  BMI (Calculated): 34.91    Physical Exam Nursing note reviewed.  Constitutional:      Appearance: Normal appearance. He is normal weight.  HENT:     Head: Normocephalic and atraumatic.     Nose: Nose normal.     Mouth/Throat:     Mouth: Mucous membranes are moist.     Pharynx: Oropharynx is clear.  Eyes:     Extraocular Movements: Extraocular movements intact.     Conjunctiva/sclera: Conjunctivae normal.     Pupils: Pupils are equal, round, and reactive to light.  Cardiovascular:     Rate and Rhythm: Normal rate and regular rhythm.     Pulses: Normal pulses.     Heart sounds: Normal heart sounds.  Pulmonary:     Effort: Pulmonary effort is normal.     Breath sounds: Normal breath sounds.  Abdominal:     General: Abdomen is flat. Bowel sounds are normal.     Palpations: Abdomen is soft.  Musculoskeletal:        General: Normal range of motion.     Cervical back: Normal range of motion.  Skin:    General: Skin is warm and dry.  Neurological:     General: No focal deficit present.     Mental Status: He is alert and oriented to person, place, and time.  Psychiatric:        Mood and Affect: Mood normal.        Behavior: Behavior normal.        Thought Content: Thought content normal.        Judgment: Judgment normal.      No results found for any visits on 04/09/23.  Recent Results (from the past 2160 hours)  Hemoglobin A1c     Status: Abnormal   Collection Time: 01/29/23  9:03 AM  Result Value Ref Range   Hgb A1c MFr Bld 5.7 (H) 4.8 - 5.6 %    Comment:          Prediabetes: 5.7 - 6.4          Diabetes: >6.4          Glycemic control for adults with diabetes: <7.0    Est. average glucose Bld gHb Est-mCnc 117 mg/dL  TSH     Status: None   Collection Time: 01/29/23  9:03 AM  Result Value Ref Range   TSH 1.310 0.450 - 4.500 uIU/mL  CMP14+EGFR     Status: None   Collection Time: 01/29/23  9:03 AM  Result Value Ref  Range   Glucose 94 70 - 99 mg/dL   BUN 16 6 - 24 mg/dL   Creatinine, Ser 8.92 0.76 - 1.27 mg/dL   eGFR 85 >40 fO/fpw/8.26   BUN/Creatinine Ratio 15 9 -  20   Sodium 139 134 - 144 mmol/L   Potassium 4.3 3.5 - 5.2 mmol/L   Chloride 102 96 - 106 mmol/L   CO2 23 20 - 29 mmol/L   Calcium  9.6 8.7 - 10.2 mg/dL   Total Protein 6.8 6.0 - 8.5 g/dL   Albumin 4.2 4.1 - 5.1 g/dL   Globulin, Total 2.6 1.5 - 4.5 g/dL   Bilirubin Total 0.5 0.0 - 1.2 mg/dL   Alkaline Phosphatase 70 44 - 121 IU/L   AST 33 0 - 40 IU/L   ALT 30 0 - 44 IU/L  Lipid panel     Status: Abnormal   Collection Time: 01/29/23  9:03 AM  Result Value Ref Range   Cholesterol, Total 181 100 - 199 mg/dL   Triglycerides 75 0 - 149 mg/dL   HDL 47 >60 mg/dL   VLDL Cholesterol Cal 14 5 - 40 mg/dL   LDL Chol Calc (NIH) 879 (H) 0 - 99 mg/dL   Chol/HDL Ratio 3.9 0.0 - 5.0 ratio    Comment:                                   T. Chol/HDL Ratio                                             Men  Women                               1/2 Avg.Risk  3.4    3.3                                   Avg.Risk  5.0    4.4                                2X Avg.Risk  9.6    7.1                                3X Avg.Risk 23.4   11.0   CBC With Diff/Platelet     Status: Abnormal   Collection Time: 02/09/23 10:57 AM  Result Value Ref Range   WBC 6.9 3.4 - 10.8 x10E3/uL   RBC 5.46 4.14 - 5.80 x10E6/uL   Hemoglobin 16.1 13.0 - 17.7 g/dL   Hematocrit 50.9 62.4 - 51.0 %   MCV 90 79 - 97 fL   MCH 29.5 26.6 - 33.0 pg   MCHC 32.9 31.5 - 35.7 g/dL   RDW 88.1 88.3 - 84.5 %   Platelets 243 150 - 450 x10E3/uL   Neutrophils 50 Not Estab. %   Lymphs 33 Not Estab. %   Monocytes 10 Not Estab. %   Eos 7 Not Estab. %   Basos 0 Not Estab. %   Neutrophils Absolute 3.4 1.4 - 7.0 x10E3/uL   Lymphocytes Absolute 2.3 0.7 - 3.1 x10E3/uL   Monocytes Absolute 0.7 0.1 - 0.9 x10E3/uL   EOS (ABSOLUTE) 0.5 (H) 0.0 - 0.4 x10E3/uL   Basophils Absolute 0.0 0.0 - 0.2 x10E3/uL    Immature  Granulocytes 0 Not Estab. %   Immature Grans (Abs) 0.0 0.0 - 0.1 x10E3/uL  CRP (C-Reactive Protein)     Status: None   Collection Time: 02/18/23 10:04 AM  Result Value Ref Range   CRP <1 0 - 10 mg/L      Assessment & Plan:  Form completed for patient.   Problem List Items Addressed This Visit       Nervous and Auditory   Right sciatic nerve pain     Other   Obesity (BMI 30.0-34.9) - Primary   Other Visit Diagnoses       Prediabetes           Return if symptoms worsen or fail to improve.   Total time spent: 25 minutes  [employer], NP  04/09/2023   This document may have been prepared by Dragon Voice Recognition software and as such may include unintentional dictation errors.

## 2023-04-23 ENCOUNTER — Ambulatory Visit
Admit: 2023-04-23 | Discharge: 2023-04-23 | Payer: BLUE CROSS/BLUE SHIELD | Attending: Clinical Nurse Specialist | Primary: Family

## 2023-04-23 VITALS — BP 118/74 | HR 55 | Ht 67.0 in | Wt 154.0 lb

## 2023-04-23 DIAGNOSIS — I2510 Atherosclerotic heart disease of native coronary artery without angina pectoris: Secondary | ICD-10-CM

## 2023-04-23 MED ORDER — ATORVASTATIN CALCIUM 80 MG PO TABS
80 | ORAL_TABLET | ORAL | 3 refills | 90.00000 days | Status: DC
Start: 2023-04-23 — End: 2024-03-09

## 2023-04-23 NOTE — Patient Instructions (Signed)
 Labs soon   Strongly recommend complete smoking cessation  Maintain good blood pressure control-goal<130/80 at rest  Maintain good cholesterol control LDL goal<70 with arterial disease  If you are diabetic work to keep/obtain hemoglobin A1c< 7    Follow up in 6-9 mos   Call with any questions or concerns  Follow up with Turner, Amy M, APRN - CNP for non cardiac problems  Report any new problems  Cardiovascular Fitness-Exercise as tolerated.  Strive for 30 minutes of exercise most days of the week.    Cardiac / Healthy Diet- Avoid processed high fat foods, maintain low sodium/salt   Continue current medications as directed  Continue plan of treatment  It is always recommended that you bring your medications bottles with you to each visit - this is for your safety!           Tips to Help You Stop Smoking   Cigarette smoking is a preventable cause of death in the [place].  If you have thought about quitting but haven't been able to, here are some reasons why you should and some ways to do it.       Here's Why   Quitting smoking now can decrease your risk of getting smoking-related illnesses like:   Heart disease, Stroke,  Cataracts, Macular degeneration, Thyroid conditions, Hearing loss, Erectile dysfunction, Dementia, Osteoporosis.    Several types of cancer, including:   Lung, Mouth, Esophagus, Larynx, Bladder, Pancreas, Kidney   Chronic lung diseases:   Bronchitis, Emphysema, Asthma   Here's How   Once you've decided to quit smoking, set your target quit date a few weeks away. In the time leading up to your quit day, try some of these ideas offered by the Tobacco Control Research Branch of the National Cancer Institute to help you successfully quit smoking.   For the best results, work with your doctor. Together, you can test your lung function and compare the results to those of a nonsmoking person. The results can be given to you as your lung age. Finding out your lung age right after having the test done  may help you to stop smoking.     Your doctor can also discuss with you all of your options and refer you to smoking-cessation support groups. You may wish to use nicotine replacement (gum, patches, inhaler) or one of the prescription medications that have been shown to increase quit rates and prolong abstinence from smoking. But whatever you and your doctor decide on these matters, it will still be you who decides when an how to quit. Here are some techniques:     Switch Brands   Switch to a brand you find distasteful.   Change to a brand that is low in tar and nicotine a couple of weeks before your target quit date. This will help change your smoking behavior. However, do not smoke more cigarettes, inhale them more often or more deeply, or place your fingertips over the holes in the filters. All of these actions will increase your nicotine intake, and the idea is to get your body used to functioning without nicotine.     Cut Down the Number of Cigarettes You Smoke   Smoke only half of each cigarette.   Each day, postpone the lighting of your first cigarette by one hour.   Decide you'll only smoke during odd or even hours of the day.   Decide beforehand how many cigarettes you'll smoke during the day. For each additional cigarette, give  a dollar to your favorite charity.   Change your eating habits to help you cut down. For example, drink milk, which many people consider incompatible with smoking. End meals or snacks with something that won't lead to a cigarette.   Reach for a glass of juice instead of a cigarette for a "pick-me-up."   Remember: Cutting down can help you quit, but it's not a substitute for quitting. If you're down to about seven cigarettes a day, it's time to set your target quit date, and get ready to stick to it.     Don't Smoke "Automatically"   Smoke only those cigarettes you really want. Catch yourself before you light up a cigarette out of pure habit.   Don't empty your ashtrays. This will  remind you of how many cigarettes you've smoked each day, and the sight and the smell of stale cigarettes butts will be very unpleasant.   Make yourself aware of each cigarette by using the opposite hand or putting cigarettes in an unfamiliar location or a different pocket to break the automatic reach.   If you light up many times during the day without even thinking about it, try to look in a mirror each time you put a match to your cigarette. You may decide you don't need it.     Make Smoking Inconvenient   Stop buying cigarettes by the carton. Wait until one pack is empty before you buy another.   Stop carrying cigarettes with you at home or at work. Make them difficult to get to.     Make Smoking Unpleasant   Smoke only under circumstances that aren't especially pleasurable for you. If you like to smoke with others, smoke alone. Turn your chair to an empty corner and focus only on the cigarette you are smoking and all its many negative effects.   Collect all your cigarette butts in one large glass container as a visual reminder of the filth made by smoking.     Just Before Quitting   Practice going without cigarettes.   Don't think of never smoking again. Think of quitting in terms of one day at a time .   Tell yourself you won't smoke today, and then don't.   Clean your clothes to rid them of the cigarette smell, which can linger a long time.     On the Day You Quit   Throw away all your cigarettes and matches. Hide Engineer, mining.   Visit the dentist and have your teeth cleaned to get rid of tobacco stains. Notice how nice they look and resolve to keep them that way.   Make a list of things you'd like to buy for yourself or someone else. Estimate the cost in terms of packs of cigarettes, and put the money aside to buy these presents.   Keep very busy on the big day. Go to the movies, exercise, take long walks, or go bike riding.   Remind your family and friends that this is your quit date, and ask  them to help you over the rough spots of the first couple of days and weeks.   Buy yourself a treat or do something special to celebrate.     Telephone and Internet Support   Call the [place] [employer]uit [employer].  Telephone, web-, and computer-based programs can offer you the support that you need to quit and to stay smoke-free. You can find many programs online.    Immediately After Quitting  Develop a clean, fresh, nonsmoking environment around yourself at work and at home. Buy yourself flowers you may be surprised how much you can enjoy their scent now.   The first few days after you quit, spend as much free time as possible in places where smoking isn't allowed, such as libraries, museums, theaters, department stores, and churches.   Drink large quantities of water and fruit juice (but avoid sodas that contain caffeine).   Try to avoid alcohol, coffee, and other beverages that you associate with cigarette smoking.   Strike up conversation instead of a Microbiologist for a cigarette.   If you miss the sensation of having a cigarette in your hand, play with something else a pencil, a paper clip, a marble.   If you miss having something in your mouth, try toothpicks or a fake cigarette.

## 2023-04-23 NOTE — Progress Notes (Signed)
 [facility]  [address], [place]  [address]  Phone: [phone]  Fax: [phone]    OFFICE VISIT:  04/23/2023    Gregory Escobar - DOB: [birth year]    Reason For Visit:  Gregory Escobar is a 53 y.o. male who is here for Follow-up (No cardiac symptoms) and Coronary artery disease involving native coronary artery of  1. Coronary artery disease, prior anteroseptal wall MI 03/13/2014 with occluded mid LAD, PCI with 4.0 x 23 mm Xience, ejection fraction 35-40%, dominant circumflex with branch vessel disease, EF improving to 50-55% by echo 01/17/2021.  2. Active ongoing tobacco use.  3. History of prior EtOH and drug abuse stopped 2010.  4. Family history of premature CAD involving his father side.     He returns today for routine follow-up.  He states he is active and doing well.  Just got a pass but so he is anxious to get fishing this spring.  He needs refills on his cholesterol medicine and repeat lab orders.  He is still smoking      Subjective  Gregory Escobar denies exertional chest pain, shortness of breath, orthopnea, paroxysmal nocturnal dyspnea, syncope, presyncope, arrhythmia, edema and fatigue.  The patient denies numbness or weakness to suggest cerebrovascular accident or transient ischemic attack.    Gregory Sjogren, APRN - CNP is PCP and follows labs including lipiids.  Gregory Escobar has the following history as recorded in EpicCare:    Patient Active Problem List    Diagnosis Date Noted    History of myocardial infarction 11/27/2015    Decreased left ventricular systolic function 11/27/2015    Smoker 11/27/2015    HTN (hypertension)     Hyperlipemia     Cardiac LV ejection fraction 30-35%     Coronary artery disease involving native coronary artery 03/13/2014    Acute MI, anterior wall (HCC) 03/13/2014     Past Medical History:   Diagnosis Date    Acute MI, anterior wall (HCC) 03/13/14    CAD (coronary artery disease) 2016    Cardiac LV ejection fraction 30-35%     MI 03/13/14 cath revealed EF 30-35  at that time    HTN (hypertension)     Hyperlipemia     Kidney stones 05/2013     Past Surgical History:   Procedure Laterality Date    CARDIAC CATHETERIZATION  2016    Stent to LAD    LITHOTRIPSY  2015    URETER STENT PLACEMENT  2015     Family History   Problem Relation Age of Onset    Heart Disease Father     High Blood Pressure Father     High Cholesterol Father      Social History     Tobacco Use    Smoking status: Every Day     Current packs/day: 0.50     Average packs/day: 0.5 packs/day for 20.0 years (10.0 ttl pk-yrs)     Types: Cigarettes    Smokeless tobacco: Former     Types: Snuff     Quit date: 05/22/1990   Substance Use Topics    Alcohol use: No     Comment: recovering addict      Current Outpatient Medications   Medication Sig Dispense Refill    atorvastatin (LIPITOR) 80 MG tablet Take 1 tablet by mouth once daily 30 tablet 0    metoprolol tartrate (LOPRESSOR) 25 MG tablet TAKE 1 TABLET BY MOUTH TWICE  DAILY 180  tablet 3    nitroGLYCERIN (NITROSTAT) 0.4 MG SL tablet Place 1 tablet under the tongue every 5 minutes as needed for Chest pain (if you have to take 3 then go the ER) 25 tablet 3    lisinopril (PRINIVIL;ZESTRIL) 2.5 MG tablet TAKE 1 TABLET BY MOUTH TWICE  DAILY 180 tablet 3    clopidogrel (PLAVIX) 75 MG tablet TAKE 1 TABLET BY MOUTH ONCE  DAILY 90 tablet 3    Multiple Vitamins-Minerals (THERAPEUTIC MULTIVITAMIN-MINERALS) tablet Take 1 tablet by mouth daily       No current facility-administered medications for this visit.     Allergies: Patient has no known allergies.    Review of Systems  Constitutional - no significant activity change, appetite change, or unexpected weight change. No fever, chills or diaphoresis.  No fatigue.   HEENT - no significant rhinorrhea or epistaxis. No tinnitus or significant hearing loss.   Eyes - no sudden vision change or amaurosis.   Respiratory - no significant wheezing, stridor, apnea or cough.  No dyspnea on exertion or shortness of breath.  Cardiovascular - no  exertional chest pain, orthopnea or PND.  No sensation of arrhythmia or slow heart rate.   No claudication or leg edema.  Gastrointestinal - no abdominal swelling or pain. No blood in stool. No severe constipation, diarrhea, nausea, or vomiting.   Genitourinary - no difficulty urinating, dysuria, frequency, or urgency. No flank pain or hematuria.   Musculoskeletal - no back pain, gait disturbance, or myalgia.   Skin - no color change or rash.  No pallor.  No new surgical incision.  Neurologic - no speech difficulty, facial asymmetry or lateralizing weakness.  No seizures, presyncope, syncope, or significant dizziness.  Hematologic - no easy bruising or excessive bleeding.   Psychiatric - no severe anxiety or insomnia.  No confusion.   All other review of systems are negative.      Objective  Vital Signs - BP 118/74   Pulse 55   Ht 1.702 Escobar (5\' 7" )   Wt 69.9 kg (154 lb)   BMI 24.12 kg/Escobar   General - Gregory Escobar is alert, cooperative, and pleasant.  Well groomed.  No acute distress.    Body habitus is normal.  HEENT - The head is normocephalic. No circumoral cyanosis.  Dentition is normal.   EYES -  No Xanthelasma, no arcus senilis, no conjunctival hemorrhages or discharge.   Neck - Supple, without increased jugular venous pressures.  No carotid bruits.  No mass.   Respiratory - Lungs are clear bilaterally.  No wheezes or rales.  Normal effort without use of accessory muscles.  Cardiovascular - Heart has regular rhythm and rate.  No murmurs, rubs or gallops.    + pedal pulses and no varicosities.      Abdominal -  Soft, nontender, nondistended.  Bowel sounds are intact.   Extremities - No clubbing, cyanosis, or  edema.   Musculoskeletal -  No clubbing .  No Osler's nodes.   Gait normal .  No kyphosis or scoliosis.   Skin -  no statis ulcers or dermatitis.  Neurological - No focal signs are identified.  Oriented to person, place and time.    Psychiatric -  Appropriate affect and mood.       Assessment:     Diagnosis  Orders   1. Essential hypertension  EKG 12 lead      2. Coronary artery disease involving native heart without angina pectoris, unspecified vessel or lesion type  EKG 12 lead      3. Coronary artery disease involving native coronary artery of native heart without angina pectoris  EKG 12 lead        Data:  BP Readings from Last 3 Encounters:   04/23/23 118/74   09/25/22 124/70   12/12/21 118/74    Pulse Readings from Last 3 Encounters:   04/23/23 55   09/25/22 68   12/12/21 (!) 49        Wt Readings from Last 3 Encounters:   04/23/23 69.9 kg (154 lb)   09/25/22 71.7 kg (158 lb)   12/12/21 72.6 kg (160 lb)     EKG today shows normal sinus/bradycardia at a rate of 55.  Old anterior infarct.  Unchanged from previous EKG    Hypertension    Blood pressure well-controlled on lisinopril and metoprolol    Hyperlipidemia  On Lipitor 80-needing refills.  Due for labs.  Will give him labs to get fasting at local clinic    CAD    Previous MI    Medical management includes beta-blocker, ACE inhibitor, Plavix and statin    Good activity tolerance.  We discussed signs and symptoms to report.  We discussed the importance of complete smoking cessation    Echo November 2022   Normal left ventricular size. Left ventricular ejection fraction is   visually estimated at 50-55%. Mild hypokinesis of apex and mid-apical   anteroseptal walls. Normal left ventricular wall thickness. Diastolic   function is indeterminate.   Normal right ventricular size with preserved RV function.   Normal bi-atrial size.   No clinically significant valvular abnormalities.   Aortic root dimension within normal limits.   No evidence of significant pericardial effusion is noted.   No previous studies.   ---------------------------------   Electronically signed by Gregory Escobar(Interpreting physician)   on 01/17/2021 12:29 PM         States taking medications as prescribed  Stable cardiovascular status. No evidence of overt heart failure, angina or dysrhythmia.    20 minutes were spent preparing, reviewing and seeing patient.  All questions answered    Plan    Labs soon   Strongly recommend complete smoking cessation  Maintain good blood pressure control-goal<130/80 at rest  Maintain good cholesterol control LDL goal<70 with arterial disease  If you are diabetic work to keep/obtain hemoglobin A1c< 7    Follow up in 6-9 mos   Call with any questions or concerns  Follow up with Gregory Escobar, Gregory M, APRN - CNP for non cardiac problems  Report any new problems  Cardiovascular Fitness-Exercise as tolerated.  Strive for 30 minutes of exercise most days of the week.    Cardiac / Healthy Diet- Avoid processed high fat foods, maintain low sodium/salt   Continue current medications as directed  Continue plan of treatment  It is always recommended that you bring your medications bottles with you to each visit - this is for your safety!       Octavia Bruckner, APRN    EMR dragon/transcription disclaimer: Much of this encounter note is electronic transcription/translation of spoken language to printed tach. Electronic translation of spoken language may be erroneous, or at times, nonsensical words or phrases may be inadvertently transcribed. Although, I have reviewed the note for such errors, some may still exist.

## 2023-04-26 ENCOUNTER — Encounter

## 2023-04-27 MED ORDER — LISINOPRIL 2.5 MG PO TABS
2.5 | ORAL_TABLET | ORAL | 3 refills | Status: DC
Start: 2023-04-27 — End: 2024-04-06

## 2023-04-27 MED ORDER — CLOPIDOGREL BISULFATE 75 MG PO TABS
75 | ORAL_TABLET | ORAL | 3 refills | Status: DC
Start: 2023-04-27 — End: 2024-04-06

## 2023-04-29 ENCOUNTER — Telehealth: Payer: Self-pay

## 2023-04-29 NOTE — Telephone Encounter (Signed)
 Patient LM stating that something was dropped off her the other day and had been faxed for him  a form for medical necessity and he said it needs to have a specific diet for treatment on the form

## 2023-04-30 ENCOUNTER — Encounter: Payer: Self-pay | Admitting: Cardiology

## 2023-05-02 ENCOUNTER — Other Ambulatory Visit: Payer: Self-pay | Admitting: Cardiology

## 2023-05-03 ENCOUNTER — Other Ambulatory Visit: Payer: Self-pay | Admitting: Cardiology

## 2023-07-01 ENCOUNTER — Ambulatory Visit

## 2023-07-01 DIAGNOSIS — K640 First degree hemorrhoids: Secondary | ICD-10-CM | POA: Diagnosis not present

## 2023-07-01 DIAGNOSIS — K621 Rectal polyp: Secondary | ICD-10-CM | POA: Diagnosis not present

## 2023-07-01 DIAGNOSIS — Z1211 Encounter for screening for malignant neoplasm of colon: Secondary | ICD-10-CM | POA: Diagnosis present

## 2023-10-10 ENCOUNTER — Encounter

## 2023-10-11 MED ORDER — METOPROLOL TARTRATE 25 MG PO TABS
25 | ORAL_TABLET | Freq: Two times a day (BID) | ORAL | 3 refills | 90.00000 days | Status: AC
Start: 2023-10-11 — End: ?

## 2023-10-13 ENCOUNTER — Telehealth: Payer: Self-pay | Admitting: Cardiology

## 2023-10-13 NOTE — Telephone Encounter (Signed)
 Patient left VM asking about his upcoming physical, if his prostate testing/biopsy will be covered?

## 2024-01-21 ENCOUNTER — Ambulatory Visit
Admit: 2024-01-21 | Discharge: 2024-01-21 | Payer: BLUE CROSS/BLUE SHIELD | Attending: Clinical Nurse Specialist | Primary: Family

## 2024-01-21 VITALS — BP 98/60 | HR 58 | Ht 67.0 in | Wt 155.0 lb

## 2024-01-21 DIAGNOSIS — R079 Chest pain, unspecified: Principal | ICD-10-CM

## 2024-01-21 MED ORDER — NITROGLYCERIN 0.4 MG SL SUBL
0.4 | ORAL_TABLET | SUBLINGUAL | 3 refills | 8.00000 days | Status: AC | PRN
Start: 2024-01-21 — End: ?

## 2024-01-21 NOTE — Progress Notes (Signed)
 "[facility]  [address], [place]  [address]  Phone: [phone]  Fax: [phone]    OFFICE VISIT:  01/21/2024    Gregory Escobar - DOB: Dec 23, 1970    Reason For Visit:  Gregory Escobar is a 53 y.o. male who is here for Coronary Artery Disease, Hypertension, and Follow-up  1. Coronary artery disease, prior anteroseptal wall MI 03/13/2014 with occluded mid LAD, PCI with 4.0 x 23 mm Xience, ejection fraction 35-40%, dominant circumflex with branch vessel disease, EF improving to 50-55% by echo 01/17/2021.  2. Active ongoing tobacco use.  3. History of prior EtOH and drug abuse stopped 2010.  4. Family history of premature CAD involving his father side.     He returns today for routine follow-up.  He states he is active and doing well.          Subjective  Gregory Escobar denies exertional chest pain, shortness of breath, orthopnea, paroxysmal nocturnal dyspnea, syncope, presyncope, arrhythmia, edema and fatigue.  The patient denies numbness or weakness to suggest cerebrovascular accident or transient ischemic attack.    Gregory Greig HERO, APRN - CNP is PCP and follows labs including lipiids.  Gregory Escobar has the following history as recorded in EpicCare:    Patient Active Problem List    Diagnosis Date Noted    History of myocardial infarction 11/27/2015    Decreased left ventricular systolic function 11/27/2015    Smoker 11/27/2015    HTN (hypertension)     Hyperlipemia     Cardiac LV ejection fraction 30-35%     Coronary artery disease involving native coronary artery 03/13/2014    Acute MI, anterior wall (HCC) 03/13/2014     Past Medical History:   Diagnosis Date    Acute MI, anterior wall (HCC) 03/13/14    CAD (coronary artery disease) 2016    Cardiac LV ejection fraction 30-35%     MI 03/13/14 cath revealed EF 30-35 at that time    HTN (hypertension)     Hyperlipemia     Kidney stones 05/2013     Past Surgical History:   Procedure Laterality Date    CARDIAC CATHETERIZATION  2016    Stent to LAD    LITHOTRIPSY   2015    URETER STENT PLACEMENT  2015     Family History   Problem Relation Age of Onset    Heart Disease Father     High Blood Pressure Father     High Cholesterol Father      Social History     Tobacco Use    Smoking status: Every Day     Current packs/day: 0.50     Average packs/day: 0.5 packs/day for 20.0 years (10.0 ttl pk-yrs)     Types: Cigarettes    Smokeless tobacco: Former     Types: Snuff     Quit date: 05/22/1990   Substance Use Topics    Alcohol use: No     Comment: recovering addict      Current Outpatient Medications   Medication Sig Dispense Refill    nitroGLYCERIN  (NITROSTAT ) 0.4 MG SL tablet Place 1 tablet under the tongue every 5 minutes as needed for Chest pain (if you have to take 3 then go the ER) 25 tablet 3    metoprolol  tartrate (LOPRESSOR ) 25 MG tablet TAKE 1 TABLET BY MOUTH TWICE  DAILY 180 tablet 3    lisinopril  (PRINIVIL ;ZESTRIL ) 2.5 MG tablet TAKE 1 TABLET BY MOUTH TWICE  DAILY 180 tablet 3    clopidogrel  (PLAVIX ) 75 MG tablet TAKE 1 TABLET BY MOUTH ONCE  DAILY 90 tablet 3    atorvastatin  (LIPITOR ) 80 MG tablet Take 1 tablet by mouth once daily 90 tablet 3    Multiple Vitamins-Minerals (THERAPEUTIC MULTIVITAMIN-MINERALS) tablet Take 1 tablet by mouth daily       No current facility-administered medications for this visit.     Allergies: Patient has no known allergies.    Review of Systems  Constitutional - no significant activity change, appetite change, or unexpected weight change. No fever, chills or diaphoresis.  No fatigue.   HEENT - no significant rhinorrhea or epistaxis. No tinnitus or significant hearing loss.   Eyes - no sudden vision change or amaurosis.   Respiratory - no significant wheezing, stridor, apnea or cough.  No dyspnea on exertion or shortness of breath.  Cardiovascular - no exertional chest pain, orthopnea or PND.  No sensation of arrhythmia or slow heart rate.   No claudication or leg edema.  Gastrointestinal - no abdominal swelling or pain. No blood in stool. No  severe constipation, diarrhea, nausea, or vomiting.   Genitourinary - no difficulty urinating, dysuria, frequency, or urgency. No flank pain or hematuria.   Musculoskeletal - no back pain, gait disturbance, or myalgia.   Skin - no color change or rash.  No pallor.  No new surgical incision.  Neurologic - no speech difficulty, facial asymmetry or lateralizing weakness.  No seizures, presyncope, syncope, or significant dizziness.  Hematologic - no easy bruising or excessive bleeding.   Psychiatric - no severe anxiety or insomnia.  No confusion.   All other review of systems are negative.      Objective  Vital Signs - BP 98/60   Pulse 58   Ht 1.702 m (5' 7)   Wt 70.3 kg (155 lb)   SpO2 96%   BMI 24.28 kg/m   General - Gregory Escobar is alert, cooperative, and pleasant.  Well groomed.  No acute distress.    Body habitus is normal.  HEENT - The head is normocephalic. No circumoral cyanosis.  Dentition is normal.   EYES -  No Xanthelasma, no arcus senilis, no conjunctival hemorrhages or discharge.   Neck - Supple, without increased jugular venous pressures.  No carotid bruits.  No mass.   Respiratory - Lungs are clear bilaterally.  No wheezes or rales.  Normal effort without use of accessory muscles.  Cardiovascular - Heart has regular rhythm and rate.  No murmurs, rubs or gallops.    + pedal pulses and no varicosities.      Abdominal -  Soft, nontender, nondistended.  Bowel sounds are intact.   Extremities - No clubbing, cyanosis, or  edema.   Musculoskeletal -  No clubbing .  No Osler's nodes.   Gait normal .  No kyphosis or scoliosis.   Skin -  no statis ulcers or dermatitis.  Neurological - No focal signs are identified.  Oriented to person, place and time.    Psychiatric -  Appropriate affect and mood.       Assessment:     Diagnosis Orders   1. Chest pain, unspecified type  nitroGLYCERIN  (NITROSTAT ) 0.4 MG SL tablet      2. Coronary artery disease involving native heart without angina pectoris, unspecified vessel or  lesion type  Lipid Panel    Comprehensive Metabolic Panel      3. Essential hypertension        4. Mixed  hyperlipidemia  Lipid Panel    Comprehensive Metabolic Panel        Data:  BP Readings from Last 3 Encounters:   01/21/24 98/60   04/23/23 118/74   09/25/22 124/70    Pulse Readings from Last 3 Encounters:   01/21/24 58   04/23/23 55   09/25/22 68        Wt Readings from Last 3 Encounters:   01/21/24 70.3 kg (155 lb)   04/23/23 69.9 kg (154 lb)   09/25/22 71.7 kg (158 lb)       Hypertension    Blood pressure well-controlled on lisinopril  and metoprolol     Hyperlipidemia  On Lipitor  80-lipids controlled per last check    Will give him labs to get fasting at local clinic in March    CAD    Previous MI    Medical management includes beta-blocker, ACE inhibitor, Plavix  and statin    Good activity tolerance.  We discussed signs and symptoms to report.  We discussed the importance of complete smoking cessation    Echo November 2022   Normal left ventricular size. Left ventricular ejection fraction is   visually estimated at 50-55%. Mild hypokinesis of apex and mid-apical   anteroseptal walls. Normal left ventricular wall thickness. Diastolic   function is indeterminate.   Normal right ventricular size with preserved RV function.   Normal bi-atrial size.   No clinically significant valvular abnormalities.   Aortic root dimension within normal limits.   No evidence of significant pericardial effusion is noted.   No previous studies.   ---------------------------------   Electronically signed by Sheran Simone(Interpreting physician)   on 01/17/2021 12:29 PM         States taking medications as prescribed  Stable cardiovascular status. No evidence of overt heart failure, angina or dysrhythmia.   20 minutes were spent preparing, reviewing and seeing patient.  All questions answered    Plan    Labs in March   Strongly recommend complete smoking cessation  Maintain good blood pressure control-goal<130/80 at rest  Maintain  good cholesterol control LDL goal<70 with arterial disease  If you are diabetic work to keep/obtain hemoglobin A1c< 7    Follow up in 6-9 mos - meet Dr Oddis   Call with any questions or concerns  Follow up with Turner, Amy M, APRN - CNP for non cardiac problems  Report any new problems  Cardiovascular Fitness-Exercise as tolerated.  Strive for 30 minutes of exercise most days of the week.    Cardiac / Healthy Diet- Avoid processed high fat foods, maintain low sodium/salt   Continue current medications as directed  Continue plan of treatment  It is always recommended that you bring your medications bottles with you to each visit - this is for your safety!       Gregory Sellers, APRN    EMR dragon/transcription disclaimer: Much of this encounter note is electronic transcription/translation of spoken language to printed tach. Electronic translation of spoken language may be erroneous, or at times, nonsensical words or phrases may be inadvertently transcribed. Although, I have reviewed the note for such errors, some may still exist.  "

## 2024-01-21 NOTE — Patient Instructions (Signed)
"  Labs in March   Strongly recommend complete smoking cessation  Maintain good blood pressure control-goal<130/80 at rest  Maintain good cholesterol control LDL goal<70 with arterial disease  If you are diabetic work to keep/obtain hemoglobin A1c< 7    Follow up in 6-9 mos - meet Dr Oddis   Call with any questions or concerns  Follow up with Turner, Amy M, APRN - CNP for non cardiac problems  Report any new problems  Cardiovascular Fitness-Exercise as tolerated.  Strive for 30 minutes of exercise most days of the week.    Cardiac / Healthy Diet- Avoid processed high fat foods, maintain low sodium/salt   Continue current medications as directed  Continue plan of treatment  It is always recommended that you bring your medications bottles with you to each visit - this is for your safety!   "

## 2024-01-31 ENCOUNTER — Encounter: Payer: Self-pay | Admitting: Cardiology

## 2024-01-31 ENCOUNTER — Ambulatory Visit: Admitting: Cardiology

## 2024-01-31 VITALS — BP 122/78 | HR 68 | Ht 79.0 in | Wt 318.2 lb

## 2024-01-31 DIAGNOSIS — R7303 Prediabetes: Secondary | ICD-10-CM | POA: Diagnosis not present

## 2024-01-31 DIAGNOSIS — E66811 Obesity, class 1: Secondary | ICD-10-CM

## 2024-01-31 DIAGNOSIS — Z0001 Encounter for general adult medical examination with abnormal findings: Secondary | ICD-10-CM

## 2024-01-31 DIAGNOSIS — Z0000 Encounter for general adult medical examination without abnormal findings: Secondary | ICD-10-CM | POA: Insufficient documentation

## 2024-01-31 DIAGNOSIS — E782 Mixed hyperlipidemia: Secondary | ICD-10-CM

## 2024-01-31 DIAGNOSIS — R0681 Apnea, not elsewhere classified: Secondary | ICD-10-CM | POA: Insufficient documentation

## 2024-01-31 DIAGNOSIS — Z0130 Encounter for examination of blood pressure without abnormal findings: Secondary | ICD-10-CM

## 2024-01-31 DIAGNOSIS — R0683 Snoring: Secondary | ICD-10-CM | POA: Insufficient documentation

## 2024-01-31 DIAGNOSIS — Z125 Encounter for screening for malignant neoplasm of prostate: Secondary | ICD-10-CM

## 2024-01-31 DIAGNOSIS — R0789 Other chest pain: Secondary | ICD-10-CM | POA: Insufficient documentation

## 2024-01-31 DIAGNOSIS — Z1329 Encounter for screening for other suspected endocrine disorder: Secondary | ICD-10-CM

## 2024-01-31 NOTE — Progress Notes (Signed)
 Complete physical exam  Patient: Martin Ho   DOB: May 13, 1970   53 y.o. Male  MRN: [id number]  Subjective:    Chief Complaint  Patient presents with   Follow-up    1 year follow up. Wants an echo, blood test and prostate PSA labs    Ericson Nafziger is a 53 y.o. male who presents today for a complete physical exam. He reports consuming a general diet. Home exercise routine includes calisthenics. Gym/ health club routine includes cardio and mod to heavy weightlifting. He generally feels well. He reports sleeping fairly well. He does have additional problems to discuss today.    Most recent fall risk assessment:     No data to display           Most recent depression screenings:    01/31/2024   11:21 AM  PHQ 2/9 Scores  PHQ - 2 Score 0  PHQ- 9 Score 0    Vision:Within last year and Dental: No current dental problems and Receives regular dental care  Past Medical History:  Diagnosis Date   Gastroenteritis due to COVID-19 virus 03/30/2020   Pneumonia due to COVID-19 virus 03/31/2020   Shingles 04/03/2022    History reviewed. No pertinent surgical history.  History reviewed. No pertinent family history.  Social History   Socioeconomic History   Marital status: Single    Spouse name: Not on file   Number of children: Not on file   Years of education: Not on file   Highest education level: Not on file  Occupational History   Not on file  Tobacco Use   Smoking status: Never   Smokeless tobacco: Never  Substance and Sexual Activity   Alcohol use: Not Currently   Drug use: Not Currently   Sexual activity: Not on file  Other Topics Concern   Not on file  Social History Narrative   Not on file   Social Drivers of Health   Financial Resource Strain: Low Risk  (03/08/2023)   Received from [facility]   Overall Financial Resource Strain (CARDIA)    Difficulty of Paying Living Expenses: Not very hard  Food Insecurity: No Food Insecurity  (03/08/2023)   Received from [facility]   Hunger Vital Sign    Within the past 12 months, you worried that your food would run out before you got the money to buy more.: Never true    Within the past 12 months, the food you bought just didn't last and you didn't have money to get more.: Never true  Transportation Needs: No Transportation Needs (03/08/2023)   Received from [facility] - Transportation    In the past 12 months, has lack of transportation kept you from medical appointments or from getting medications?: No    Lack of Transportation (Non-Medical): No  Physical Activity: Not on file  Stress: Not on file  Social Connections: Not on file  Intimate Partner Violence: Not on file    Outpatient Medications Prior to Visit  Medication Sig   cetirizine (ZYRTEC) 10 MG chewable tablet Chew 10 mg by mouth daily.   clobetasol (TEMOVATE) 0.05 % external solution Apply 1 Application topically 2 (two) times daily.   cyclobenzaprine  (FLEXERIL ) 5 MG tablet Take 1 tablet (5 mg total) by mouth 3 (three) times daily as needed for muscle spasms.   fluocinonide ointment (LIDEX) 0.05 % Apply 1 Application topically 2 (two) times daily.   rosuvastatin  (CRESTOR ) 20 MG  tablet Take 1 tablet (20 mg total) by mouth daily.   ustekinumab  (STELARA ) 90 MG/ML SOSY injection Inject 90 mg into the skin.   [DISCONTINUED] doxycycline (VIBRA-TABS) 100 MG tablet Take 100 mg by mouth 2 (two) times daily. (Patient not taking: Reported on 01/31/2024)   [DISCONTINUED] gabapentin (NEURONTIN) 100 MG capsule Take 100 mg by mouth daily. (Patient not taking: Reported on 01/31/2024)   [DISCONTINUED] pantoprazole  (PROTONIX ) 20 MG tablet TAKE 1 TABLET BY MOUTH EVERY DAY (Patient not taking: Reported on 01/31/2024)   [DISCONTINUED] Tapinarof (VTAMA) 1 % CREA Apply topically. (Patient not taking: Reported on 01/31/2024)   No facility-administered medications prior to visit.    Review of  Systems  Constitutional: Negative.   HENT: Negative.    Eyes: Negative.   Respiratory: Negative.  Negative for shortness of breath.   Cardiovascular:  Positive for chest pain.  Gastrointestinal: Negative.  Negative for abdominal pain, constipation and diarrhea.  Genitourinary: Negative.   Musculoskeletal:  Negative for joint pain and myalgias.  Skin: Negative.   Neurological: Negative.  Negative for dizziness and headaches.  Endo/Heme/Allergies: Negative.   All other systems reviewed and are negative.       Objective:     BP 122/78 (BP Location: Right Arm, Patient Position: Sitting, Cuff Size: Large)   Pulse 68   Ht 6' 7 (2.007 m)   Wt (!) 318 lb 3.2 oz (144.3 kg)   SpO2 98%   BMI 35.85 kg/m  BP Readings from Last 3 Encounters:  01/31/24 122/78  04/09/23 (!) 141/89  02/09/23 134/76      Physical Exam Nursing note reviewed.  Constitutional:      Appearance: Normal appearance. He is normal weight.  HENT:     Head: Normocephalic and atraumatic.     Nose: Nose normal.     Mouth/Throat:     Mouth: Mucous membranes are moist.     Pharynx: Oropharynx is clear.  Eyes:     Extraocular Movements: Extraocular movements intact.     Conjunctiva/sclera: Conjunctivae normal.     Pupils: Pupils are equal, round, and reactive to light.  Cardiovascular:     Rate and Rhythm: Normal rate and regular rhythm.     Pulses: Normal pulses.     Heart sounds: Normal heart sounds.  Pulmonary:     Effort: Pulmonary effort is normal.     Breath sounds: Normal breath sounds.  Abdominal:     General: Abdomen is flat. Bowel sounds are normal.     Palpations: Abdomen is soft.  Musculoskeletal:        General: Normal range of motion.     Cervical back: Normal range of motion.  Skin:    General: Skin is warm and dry.  Neurological:     General: No focal deficit present.     Mental Status: He is alert and oriented to person, place, and time.  Psychiatric:        Mood and Affect: Mood  normal.        Behavior: Behavior normal.        Thought Content: Thought content normal.        Judgment: Judgment normal.        01/31/2024   11:22 AM  GAD 7 : Generalized Anxiety Score  Nervous, Anxious, on Edge 0  Control/stop worrying 0  Worry too much - different things 0  Trouble relaxing 1  Restless 0  Easily annoyed or irritable 0  Afraid - awful might happen 0  Total GAD 7 Score 1  Anxiety Difficulty Not difficult at all    Flowsheet Row Office Visit from 01/31/2024 in [facility]  Thoughts that you would be better off dead, or of hurting yourself in some way Not at all  PHQ-9 Total Score 0     No results found for any visits on 01/31/24.  No results found for this or any previous visit (from the past 2160 hours).      Assessment & Plan:    Routine Health Maintenance and Physical Exam   There is no immunization history on file for this patient.  Health Maintenance  Topic Date Due   Hepatitis C Screening  Never done   DTaP/Tdap/Td (1 - Tdap) Never done   Hepatitis B Vaccines 19-59 Average Risk (1 of 3 - 19+ 3-dose series) Never done   Pneumococcal Vaccine: 50+ Years (1 of 1 - PCV) Never done   Zoster Vaccines- Shingrix (1 of 2) Never done   Influenza Vaccine  Never done   COVID-19 Vaccine (1 - 2025-26 season) Never done   Colonoscopy  07/01/2033   HIV Screening  Completed   HPV VACCINES  Aged Out   Meningococcal B Vaccine  Aged Out    Discussed health benefits of physical activity, and encouraged him to engage in regular exercise appropriate for his age and condition.  Problem List Items Addressed This Visit       Other   Mixed hyperlipidemia   Relevant Orders   Lipid Profile   Obesity (BMI 30.0-34.9)   Prediabetes   Relevant Orders   CMP14+EGFR   Hemoglobin A1c   Encounter for annual health examination - Primary   Snoring   Relevant Orders   Ambulatory referral to Sleep Studies   Apnea, transient   Relevant Orders    Ambulatory referral to Sleep Studies   Other chest pain   Relevant Orders   Ambulatory referral to Cardiology   Other Visit Diagnoses       Prostate cancer screening       Relevant Orders   PSA     Thyroid  disorder screening       Relevant Orders   TSH      Return in about 6 months (around 07/31/2024) for fasting lab work prior.     Jeoffrey Pollen, NP  01/31/2024   This document may have been prepared by Dragon Voice Recognition software and as such may include unintentional dictation errors.

## 2024-02-01 ENCOUNTER — Ambulatory Visit: Payer: Self-pay | Admitting: Cardiology

## 2024-02-01 LAB — CMP14+EGFR
ALT: 30 IU/L (ref 0–44)
AST: 33 IU/L (ref 0–40)
Albumin: 4.2 g/dL (ref 4.1–5.1)
Alkaline Phosphatase: 70 IU/L (ref 47–123)
BUN/Creatinine Ratio: 13 (ref 9–20)
BUN: 14 mg/dL (ref 6–24)
Bilirubin Total: 0.5 mg/dL (ref 0.0–1.2)
CO2: 24 mmol/L (ref 20–29)
Calcium: 9.7 mg/dL (ref 8.7–10.2)
Chloride: 101 mmol/L (ref 96–106)
Creatinine, Ser: 1.05 mg/dL (ref 0.76–1.27)
Globulin, Total: 2.5 g/dL (ref 1.5–4.5)
Glucose: 88 mg/dL (ref 70–99)
Potassium: 4.6 mmol/L (ref 3.5–5.2)
Sodium: 139 mmol/L (ref 134–144)
Total Protein: 6.7 g/dL (ref 6.0–8.5)
eGFR: 86 mL/min/1.73 (ref >59)

## 2024-02-01 LAB — LIPID PANEL
Chol/HDL Ratio: 4.5 ratio (ref 0.0–5.0)
Cholesterol, Total: 205 mg/dL — ABNORMAL HIGH (ref 100–199)
HDL: 46 mg/dL (ref >39)
LDL Chol Calc (NIH): 147 mg/dL — ABNORMAL HIGH (ref 0–99)
Triglycerides: 68 mg/dL (ref 0–149)
VLDL Cholesterol Cal: 12 mg/dL (ref 5–40)

## 2024-02-01 LAB — TSH: TSH: 0.926 u[IU]/mL (ref 0.450–4.500)

## 2024-02-01 LAB — HEMOGLOBIN A1C
Est. average glucose Bld gHb Est-mCnc: 117 mg/dL
Hgb A1c MFr Bld: 5.7 % — ABNORMAL HIGH (ref 4.8–5.6)

## 2024-02-01 LAB — PSA: Prostate Specific Ag, Serum: 0.5 ng/mL (ref 0.0–4.0)

## 2024-02-02 ENCOUNTER — Other Ambulatory Visit: Payer: Self-pay

## 2024-02-02 NOTE — Progress Notes (Signed)
Pt informed

## 2024-02-03 MED ORDER — ROSUVASTATIN CALCIUM 20 MG PO TABS: 20.0000 mg | ORAL_TABLET | Freq: Every day | ORAL | 11 refills | Status: AC

## 2024-02-07 ENCOUNTER — Encounter: Payer: Self-pay | Admitting: Cardiovascular Disease

## 2024-02-07 ENCOUNTER — Ambulatory Visit: Admitting: Cardiovascular Disease

## 2024-02-07 VITALS — BP 112/64 | HR 74 | Ht 79.0 in | Wt 314.0 lb

## 2024-02-07 DIAGNOSIS — R0789 Other chest pain: Secondary | ICD-10-CM

## 2024-02-07 DIAGNOSIS — R0681 Apnea, not elsewhere classified: Secondary | ICD-10-CM

## 2024-02-07 DIAGNOSIS — E1100 Type 2 diabetes mellitus with hyperosmolarity without nonketotic hyperglycemic-hyperosmolar coma (NKHHC): Secondary | ICD-10-CM

## 2024-02-07 DIAGNOSIS — E66811 Obesity, class 1: Secondary | ICD-10-CM | POA: Diagnosis not present

## 2024-02-07 DIAGNOSIS — E782 Mixed hyperlipidemia: Secondary | ICD-10-CM

## 2024-02-07 DIAGNOSIS — I10 Essential (primary) hypertension: Secondary | ICD-10-CM

## 2024-02-07 NOTE — Progress Notes (Signed)
 Cardiology Office Note   Date:  02/07/2024   ID:  Martin Ho, DOB 1970-03-07, MRN [id number]  PCP:  Martin Gauze, NP  Cardiologist:  Denyse Bathe, MD      History of Present Illness: Martin Ho is a 53 y.o. male who presents for  Chief Complaint  Patient presents with   Consult    Cardiac Consult    [name] presents with chest pains and SOB.  Chest Pain  This is a new problem. The current episode started more than 1 month ago. The onset quality is sudden. The problem has been waxing and waning. The pain is present in the substernal region. The pain is at a severity of 6/10. The quality of the pain is described as tightness. Associated symptoms include diaphoresis, dizziness, numbness and weakness. Pertinent negatives include no nausea, shortness of breath or vomiting.      Past Medical History:  Diagnosis Date   Gastroenteritis due to COVID-19 virus 03/30/2020   Pneumonia due to COVID-19 virus 03/31/2020   Shingles 04/03/2022     History reviewed. No pertinent surgical history.   Current Outpatient Medications  Medication Sig Dispense Refill   cetirizine (ZYRTEC) 10 MG chewable tablet Chew 10 mg by mouth daily.     clobetasol (TEMOVATE) 0.05 % external solution Apply 1 Application topically 2 (two) times daily.     cyclobenzaprine  (FLEXERIL ) 5 MG tablet Take 1 tablet (5 mg total) by mouth 3 (three) times daily as needed for muscle spasms. 30 tablet 0   fluocinonide ointment (LIDEX) 0.05 % Apply 1 Application topically 2 (two) times daily.     rosuvastatin  (CRESTOR ) 20 MG tablet Take 1 tablet (20 mg total) by mouth daily. 30 tablet 11   ustekinumab  (STELARA ) 90 MG/ML SOSY injection Inject 90 mg into the skin.     No current facility-administered medications for this visit.    Allergies:   Patient has no known allergies.    Social History:   reports that he has never smoked. He has never used smokeless tobacco. He reports that he does not currently use  alcohol. He reports that he does not currently use drugs.   Family History:  family history is not on file.    ROS:     Review of Systems  Constitutional:  Positive for diaphoresis.  HENT: Negative.    Eyes: Negative.   Respiratory:  Negative for shortness of breath.   Cardiovascular:  Positive for chest pain.  Gastrointestinal:  Negative for nausea and vomiting.  Genitourinary: Negative.   Musculoskeletal: Negative.   Skin: Negative.   Neurological:  Positive for dizziness, weakness and numbness.  Endo/Heme/Allergies: Negative.   Psychiatric/Behavioral: Negative.    All other systems reviewed and are negative.     All other systems are reviewed and negative.    PHYSICAL EXAM: VS:  BP 112/64   Pulse 74   Ht 6' 7 (2.007 m)   Wt (!) 314 lb (142.4 kg)   SpO2 96%   BMI 35.37 kg/m  , BMI Body mass index is 35.37 kg/m. Last weight:  Wt Readings from Last 3 Encounters:  02/07/24 (!) 314 lb (142.4 kg)  01/31/24 (!) 318 lb 3.2 oz (144.3 kg)  04/09/23 (!) 310 lb (140.6 kg)     Physical Exam Vitals reviewed.  Constitutional:      Appearance: Normal appearance. He is normal weight.  HENT:     Head: Normocephalic.     Nose: Nose normal.  Mouth/Throat:     Mouth: Mucous membranes are moist.  Eyes:     Pupils: Pupils are equal, round, and reactive to light.  Cardiovascular:     Rate and Rhythm: Normal rate and regular rhythm.     Pulses: Normal pulses.     Heart sounds: Normal heart sounds.  Pulmonary:     Effort: Pulmonary effort is normal.  Abdominal:     General: Abdomen is flat. Bowel sounds are normal.  Musculoskeletal:        General: Normal range of motion.     Cervical back: Normal range of motion.  Skin:    General: Skin is warm.  Neurological:     General: No focal deficit present.     Mental Status: He is alert.  Psychiatric:        Mood and Affect: Mood normal.    -   EKG: NSR 71/min non specfic st and t changes  Recent Labs: 02/09/2023:  Hemoglobin 16.1; Platelets 243 01/31/2024: ALT 30; BUN 14; Creatinine, Ser 1.05; Potassium 4.6; Sodium 139; TSH 0.926    Lipid Panel    Component Value Date/Time   CHOL 205 (H) 01/31/2024 1124   TRIG 68 01/31/2024 1124   HDL 46 01/31/2024 1124   CHOLHDL 4.5 01/31/2024 1124   LDLCALC 147 (H) 01/31/2024 1124      Other studies Reviewed: Additional studies/ records that were reviewed today include:  Review of the above records demonstrates:       No data to display            ASSESSMENT AND PLAN:    ICD-10-CM   1. Other chest pain  R07.89 PCV ECHOCARDIOGRAM COMPLETE    MYOCARDIAL PERFUSION IMAGING   set up echo, stress test    2. Mixed hyperlipidemia  E78.2 PCV ECHOCARDIOGRAM COMPLETE    MYOCARDIAL PERFUSION IMAGING    3. Obesity (BMI 30.0-34.9)  E66.811 PCV ECHOCARDIOGRAM COMPLETE    MYOCARDIAL PERFUSION IMAGING    4. Essential hypertension, benign  I10 PCV ECHOCARDIOGRAM COMPLETE    MYOCARDIAL PERFUSION IMAGING    5. Apnea, transient  R06.81 PCV ECHOCARDIOGRAM COMPLETE    MYOCARDIAL PERFUSION IMAGING    6. Diabetes mellitus type 2 with hyperosmolarity, uncontrolled (HCC)  E11.00 PCV ECHOCARDIOGRAM COMPLETE    MYOCARDIAL PERFUSION IMAGING       Problem List Items Addressed This Visit       Other   Mixed hyperlipidemia   Relevant Orders   PCV ECHOCARDIOGRAM COMPLETE   MYOCARDIAL PERFUSION IMAGING   Obesity (BMI 30.0-34.9)   Relevant Orders   PCV ECHOCARDIOGRAM COMPLETE   MYOCARDIAL PERFUSION IMAGING   Apnea, transient   Relevant Orders   PCV ECHOCARDIOGRAM COMPLETE   MYOCARDIAL PERFUSION IMAGING   Other chest pain - Primary   Relevant Orders   PCV ECHOCARDIOGRAM COMPLETE   MYOCARDIAL PERFUSION IMAGING   Other Visit Diagnoses       Essential hypertension, benign       Relevant Orders   PCV ECHOCARDIOGRAM COMPLETE   MYOCARDIAL PERFUSION IMAGING     Diabetes mellitus type 2 with hyperosmolarity, uncontrolled (HCC)       Relevant Orders   PCV  ECHOCARDIOGRAM COMPLETE   MYOCARDIAL PERFUSION IMAGING          Disposition:   Return in about 4 weeks (around 03/06/2024) for echo, stress test and f/u.    Total time spent: 50 minutes  Signed,  Denyse Bathe, MD  02/07/2024 9:47 AM  [facility]

## 2024-02-14 ENCOUNTER — Encounter: Payer: No Typology Code available for payment source | Admitting: Cardiology

## 2024-02-21 ENCOUNTER — Encounter

## 2024-02-25 ENCOUNTER — Ambulatory Visit

## 2024-02-25 DIAGNOSIS — E66811 Obesity, class 1: Secondary | ICD-10-CM

## 2024-02-25 DIAGNOSIS — I10 Essential (primary) hypertension: Secondary | ICD-10-CM

## 2024-02-25 DIAGNOSIS — I361 Nonrheumatic tricuspid (valve) insufficiency: Secondary | ICD-10-CM | POA: Diagnosis not present

## 2024-02-25 DIAGNOSIS — R0789 Other chest pain: Secondary | ICD-10-CM

## 2024-02-25 DIAGNOSIS — I340 Nonrheumatic mitral (valve) insufficiency: Secondary | ICD-10-CM

## 2024-02-25 DIAGNOSIS — E782 Mixed hyperlipidemia: Secondary | ICD-10-CM

## 2024-02-25 DIAGNOSIS — R0681 Apnea, not elsewhere classified: Secondary | ICD-10-CM

## 2024-02-25 DIAGNOSIS — E1100 Type 2 diabetes mellitus with hyperosmolarity without nonketotic hyperglycemic-hyperosmolar coma (NKHHC): Secondary | ICD-10-CM

## 2024-02-29 ENCOUNTER — Ambulatory Visit: Admitting: Cardiovascular Disease

## 2024-03-09 MED ORDER — ATORVASTATIN CALCIUM 80 MG PO TABS
80 | ORAL_TABLET | ORAL | 3 refills | 90.00000 days | Status: DC
Start: 2024-03-09 — End: 2024-03-14

## 2024-03-13 ENCOUNTER — Encounter

## 2024-03-14 MED ORDER — ATORVASTATIN CALCIUM 80 MG PO TABS
80 | ORAL_TABLET | ORAL | 5 refills | Status: AC
Start: 2024-03-14 — End: ?

## 2024-03-14 MED ORDER — ATORVASTATIN CALCIUM 80 MG PO TABS
80 | ORAL_TABLET | ORAL | 3 refills | Status: DC
Start: 2024-03-14 — End: 2024-03-14

## 2024-03-20 ENCOUNTER — Ambulatory Visit

## 2024-03-20 DIAGNOSIS — R0681 Apnea, not elsewhere classified: Secondary | ICD-10-CM

## 2024-03-20 DIAGNOSIS — E1100 Type 2 diabetes mellitus with hyperosmolarity without nonketotic hyperglycemic-hyperosmolar coma (NKHHC): Secondary | ICD-10-CM

## 2024-03-20 DIAGNOSIS — R0789 Other chest pain: Secondary | ICD-10-CM

## 2024-03-20 DIAGNOSIS — E782 Mixed hyperlipidemia: Secondary | ICD-10-CM

## 2024-03-20 DIAGNOSIS — E66811 Obesity, class 1: Secondary | ICD-10-CM

## 2024-03-20 DIAGNOSIS — I10 Essential (primary) hypertension: Secondary | ICD-10-CM

## 2024-03-21 ENCOUNTER — Other Ambulatory Visit: Payer: Self-pay | Admitting: Medical Genetics

## 2024-03-22 ENCOUNTER — Ambulatory Visit: Payer: Self-pay | Admitting: Nurse Practitioner

## 2024-04-03 ENCOUNTER — Ambulatory Visit: Admitting: Cardiovascular Disease

## 2024-04-05 ENCOUNTER — Encounter

## 2024-04-06 MED ORDER — LISINOPRIL 2.5 MG PO TABS
2.5 | ORAL_TABLET | Freq: Two times a day (BID) | ORAL | 3 refills | Status: AC
Start: 2024-04-06 — End: ?

## 2024-04-06 MED ORDER — CLOPIDOGREL BISULFATE 75 MG PO TABS
75 | ORAL_TABLET | Freq: Every day | ORAL | 3 refills | Status: AC
Start: 2024-04-06 — End: ?

## 2024-04-06 NOTE — Telephone Encounter (Signed)
 Last appt: 01/21/2024  Next appt: 10/20/2024

## 2024-04-28 ENCOUNTER — Ambulatory Visit: Admitting: Cardiovascular Disease

## 2024-07-31 ENCOUNTER — Ambulatory Visit: Admitting: Cardiology
